# Patient Record
Sex: Male | Born: 1970 | Race: White | Hispanic: No | Smoking: Former smoker
Health system: Southern US, Community
[De-identification: ages and names within clinical notes are randomized; demographics above are authoritative.]

## PROBLEM LIST (undated history)

## (undated) DIAGNOSIS — F329 Major depressive disorder, single episode, unspecified: Secondary | ICD-10-CM

## (undated) DIAGNOSIS — F419 Anxiety disorder, unspecified: Secondary | ICD-10-CM

## (undated) DIAGNOSIS — F32A Depression, unspecified: Secondary | ICD-10-CM

## (undated) DIAGNOSIS — F1010 Alcohol abuse, uncomplicated: Secondary | ICD-10-CM

## (undated) DIAGNOSIS — I10 Essential (primary) hypertension: Secondary | ICD-10-CM

## (undated) DIAGNOSIS — I4891 Unspecified atrial fibrillation: Secondary | ICD-10-CM

---

## 2012-07-09 ENCOUNTER — Emergency Department (HOSPITAL_COMMUNITY)
Admission: EM | Admit: 2012-07-09 | Discharge: 2012-07-10 | Disposition: A | Discharge status: Other Acute Inpatient Hospital | Payer: 59 | Attending: Emergency Medicine | Admitting: Emergency Medicine

## 2012-07-09 ENCOUNTER — Encounter (HOSPITAL_COMMUNITY): Payer: Self-pay | Admitting: *Deleted

## 2012-07-09 DIAGNOSIS — F1010 Alcohol abuse, uncomplicated: Secondary | ICD-10-CM | POA: Insufficient documentation

## 2012-07-09 DIAGNOSIS — F17200 Nicotine dependence, unspecified, uncomplicated: Secondary | ICD-10-CM | POA: Insufficient documentation

## 2012-07-09 DIAGNOSIS — I10 Essential (primary) hypertension: Secondary | ICD-10-CM | POA: Insufficient documentation

## 2012-07-09 HISTORY — DX: Essential (primary) hypertension: I10

## 2012-07-09 LAB — POCT I-STAT, CHEM 8
BUN: 8 mg/dL (ref 6–23)
Chloride: 103 mEq/L (ref 96–112)
Creatinine, Ser: 1 mg/dL (ref 0.50–1.35)
Potassium: 3.4 mEq/L — ABNORMAL LOW (ref 3.5–5.1)
Sodium: 145 mEq/L (ref 135–145)

## 2012-07-09 LAB — ETHANOL: Alcohol, Ethyl (B): 224 mg/dL — ABNORMAL HIGH (ref 0–11)

## 2012-07-09 MED ORDER — ALUM & MAG HYDROXIDE-SIMETH 200-200-20 MG/5ML PO SUSP: 30.0000 mL | ORAL | Status: DC | PRN

## 2012-07-09 MED ORDER — FOLIC ACID 1 MG PO TABS
1.0000 mg | ORAL_TABLET | Freq: Every day | ORAL | Status: DC
  Administered 2012-07-09 – 2012-07-10 (×2): 1 mg via ORAL
  Filled 2012-07-09 (×2): qty 1

## 2012-07-09 MED ORDER — ZOLPIDEM TARTRATE 5 MG PO TABS
5.0000 mg | ORAL_TABLET | Freq: Every evening | ORAL | Status: DC | PRN
  Administered 2012-07-10: 5 mg via ORAL
  Filled 2012-07-09: qty 1

## 2012-07-09 MED ORDER — NICOTINE 21 MG/24HR TD PT24: 21.0000 mg | MEDICATED_PATCH | Freq: Every day | TRANSDERMAL | Status: DC | PRN

## 2012-07-09 MED ORDER — VITAMIN B-1 100 MG PO TABS
100.0000 mg | ORAL_TABLET | Freq: Every day | ORAL | Status: DC
  Administered 2012-07-09 – 2012-07-10 (×2): 100 mg via ORAL
  Filled 2012-07-09 (×2): qty 1

## 2012-07-09 MED ORDER — THIAMINE HCL 100 MG/ML IJ SOLN: 100.0000 mg | Freq: Every day | INTRAMUSCULAR | Status: DC

## 2012-07-09 MED ORDER — ONDANSETRON HCL 8 MG PO TABS: 4.0000 mg | ORAL_TABLET | Freq: Three times a day (TID) | ORAL | Status: DC | PRN

## 2012-07-09 MED ORDER — LORAZEPAM 1 MG PO TABS
1.0000 mg | ORAL_TABLET | Freq: Four times a day (QID) | ORAL | Status: DC | PRN
  Administered 2012-07-09 – 2012-07-10 (×3): 1 mg via ORAL
  Filled 2012-07-09 (×4): qty 1

## 2012-07-09 MED ORDER — ACETAMINOPHEN 325 MG PO TABS: 650.0000 mg | ORAL_TABLET | ORAL | Status: DC | PRN

## 2012-07-09 MED ORDER — LORAZEPAM 2 MG/ML IJ SOLN: 1.0000 mg | Freq: Four times a day (QID) | INTRAMUSCULAR | Status: DC | PRN

## 2012-07-09 MED ORDER — ADULT MULTIVITAMIN W/MINERALS CH
1.0000 | ORAL_TABLET | Freq: Every day | ORAL | Status: DC
  Administered 2012-07-09 – 2012-07-10 (×2): 1 via ORAL
  Filled 2012-07-09 (×2): qty 1

## 2012-07-09 MED ORDER — IBUPROFEN 200 MG PO TABS: 600.0000 mg | ORAL_TABLET | Freq: Three times a day (TID) | ORAL | Status: DC | PRN

## 2012-07-09 NOTE — ED Notes (Signed)
Pt is requesting detox from etoh, last drank this am. Denies any SI or HI. Pt tearful at triage.

## 2012-07-09 NOTE — ED Provider Notes (Signed)
History     CSN: [id number]  Arrival date & time 07/09/12  1814   None     Chief Complaint  Patient presents with  . Medical Clearance     HPI Pt is requesting detox from etoh, last drank this am. Denies any SI or HI. Pt tearful at triage. No HI/SI.   Past Medical History  Diagnosis Date  . Hypertension     History reviewed. No pertinent past surgical history.  History reviewed. No pertinent family history.  History  Substance Use Topics  . Smoking status: Current Everyday Smoker    Types: Cigarettes  . Smokeless tobacco: Not on file  . Alcohol Use: Yes      Review of Systems  Psychiatric/Behavioral: Positive for decreased concentration. Negative for hallucinations.  All other systems reviewed and are negative.    Allergies  Review of patient's allergies indicates no known allergies.  Home Medications   Current Outpatient Rx  Name Route Sig Dispense Refill  . AZOR PO Oral Take 1 tablet by mouth daily.      BP 158/110  Pulse 112  Temp 99.5 F (37.5 C) (Oral)  Resp 20  SpO2 94%  Physical Exam  Nursing note and vitals reviewed. Constitutional: He is oriented to person, place, and time. He appears well-developed and well-nourished. No distress.  HENT:  Head: Normocephalic and atraumatic.  Eyes: Pupils are equal, round, and reactive to light.  Neck: Normal range of motion.  Cardiovascular: Normal rate and intact distal pulses.   Pulmonary/Chest: No respiratory distress.  Abdominal: Normal appearance. He exhibits no distension.  Musculoskeletal: Normal range of motion.  Neurological: He is alert and oriented to person, place, and time. No cranial nerve deficit. He displays no seizure activity. GCS eye subscore is 4. GCS verbal subscore is 5. GCS motor subscore is 6.  Skin: Skin is warm and dry. No rash noted.  Psychiatric: He has a normal mood and affect. His behavior is normal.    ED Course  Procedures (including critical care time)  Labs  Reviewed  ETHANOL - Abnormal; Notable for the following:    Alcohol, Ethyl (B) 224 (*)     All other components within normal limits  POCT I-STAT, CHEM 8 - Abnormal; Notable for the following:    Potassium 3.4 (*)     Glucose, Bld 175 (*)     All other components within normal limits  URINE RAPID DRUG SCREEN (HOSP PERFORMED)   No results found.   No diagnosis found.    MDM          Nelia Shi, MD 07/09/12 [phone]

## 2012-07-10 ENCOUNTER — Inpatient Hospital Stay (HOSPITAL_COMMUNITY)
Admission: AD | Admit: 2012-07-10 | Discharge: 2012-07-14 | DRG: 897 | Disposition: A | Discharge status: Home / Self Care | Payer: 59 | Source: Ambulatory Visit | Attending: Psychiatry | Admitting: Psychiatry

## 2012-07-10 ENCOUNTER — Encounter (HOSPITAL_COMMUNITY): Payer: Self-pay | Admitting: *Deleted

## 2012-07-10 DIAGNOSIS — I10 Essential (primary) hypertension: Secondary | ICD-10-CM | POA: Diagnosis present

## 2012-07-10 DIAGNOSIS — F10229 Alcohol dependence with intoxication, unspecified: Principal | ICD-10-CM | POA: Diagnosis present

## 2012-07-10 DIAGNOSIS — F411 Generalized anxiety disorder: Secondary | ICD-10-CM | POA: Diagnosis present

## 2012-07-10 DIAGNOSIS — Z79899 Other long term (current) drug therapy: Secondary | ICD-10-CM

## 2012-07-10 DIAGNOSIS — R259 Unspecified abnormal involuntary movements: Secondary | ICD-10-CM | POA: Diagnosis present

## 2012-07-10 DIAGNOSIS — F1010 Alcohol abuse, uncomplicated: Secondary | ICD-10-CM | POA: Diagnosis present

## 2012-07-10 MED ORDER — ONDANSETRON 4 MG PO TBDP: 4.0000 mg | ORAL_TABLET | Freq: Four times a day (QID) | ORAL | Status: AC | PRN

## 2012-07-10 MED ORDER — MAGNESIUM HYDROXIDE 400 MG/5ML PO SUSP: 30.0000 mL | Freq: Every day | ORAL | Status: DC | PRN

## 2012-07-10 MED ORDER — HYDROXYZINE HCL 25 MG PO TABS: 25.0000 mg | ORAL_TABLET | Freq: Four times a day (QID) | ORAL | Status: DC | PRN

## 2012-07-10 MED ORDER — ACETAMINOPHEN 325 MG PO TABS: 650.0000 mg | ORAL_TABLET | Freq: Four times a day (QID) | ORAL | Status: DC | PRN

## 2012-07-10 MED ORDER — IRBESARTAN 150 MG PO TABS: 150.0000 mg | ORAL_TABLET | Freq: Every day | ORAL | Status: DC

## 2012-07-10 MED ORDER — ESCITALOPRAM OXALATE 20 MG PO TABS
20.0000 mg | ORAL_TABLET | Freq: Every day | ORAL | Status: DC
  Administered 2012-07-11 – 2012-07-14 (×4): 20 mg via ORAL
  Filled 2012-07-10 (×5): qty 1
  Filled 2012-07-10: qty 3
  Filled 2012-07-10: qty 1

## 2012-07-10 MED ORDER — IRBESARTAN 150 MG PO TABS
150.0000 mg | ORAL_TABLET | Freq: Every day | ORAL | Status: DC
  Administered 2012-07-10 – 2012-07-14 (×5): 150 mg via ORAL
  Filled 2012-07-10: qty 3
  Filled 2012-07-10 (×7): qty 1

## 2012-07-10 MED ORDER — LOPERAMIDE HCL 2 MG PO CAPS: 2.0000 mg | ORAL_CAPSULE | ORAL | Status: DC | PRN

## 2012-07-10 MED ORDER — LORAZEPAM 1 MG PO TABS
2.0000 mg | ORAL_TABLET | Freq: Once | ORAL | Status: AC
  Administered 2012-07-10: 2 mg via ORAL
  Filled 2012-07-10: qty 2

## 2012-07-10 MED ORDER — VITAMIN B-1 100 MG PO TABS
100.0000 mg | ORAL_TABLET | Freq: Every day | ORAL | Status: DC
  Administered 2012-07-11: 100 mg via ORAL
  Filled 2012-07-10 (×2): qty 1

## 2012-07-10 MED ORDER — LORAZEPAM 1 MG PO TABS
1.0000 mg | ORAL_TABLET | Freq: Once | ORAL | Status: AC
  Administered 2012-07-10: 1 mg via ORAL
  Filled 2012-07-10: qty 1

## 2012-07-10 MED ORDER — THIAMINE HCL 100 MG/ML IJ SOLN: 100.0000 mg | Freq: Once | INTRAMUSCULAR | Status: DC

## 2012-07-10 MED ORDER — ALUM & MAG HYDROXIDE-SIMETH 200-200-20 MG/5ML PO SUSP: 30.0000 mL | ORAL | Status: DC | PRN

## 2012-07-10 MED ORDER — AMLODIPINE BESYLATE 10 MG PO TABS
10.0000 mg | ORAL_TABLET | Freq: Once | ORAL | Status: AC
  Administered 2012-07-10: 10 mg via ORAL
  Filled 2012-07-10: qty 1

## 2012-07-10 MED ORDER — ADULT MULTIVITAMIN W/MINERALS CH
1.0000 | ORAL_TABLET | Freq: Every day | ORAL | Status: DC
  Administered 2012-07-11: 1 via ORAL
  Filled 2012-07-10 (×2): qty 1

## 2012-07-10 MED ORDER — CLONIDINE HCL 0.1 MG PO TABS
0.1000 mg | ORAL_TABLET | Freq: Four times a day (QID) | ORAL | Status: DC | PRN
  Administered 2012-07-11 – 2012-07-12 (×3): 0.1 mg via ORAL

## 2012-07-10 MED ORDER — CHLORDIAZEPOXIDE HCL 25 MG PO CAPS
50.0000 mg | ORAL_CAPSULE | Freq: Once | ORAL | Status: AC
  Administered 2012-07-10: 50 mg via ORAL
  Filled 2012-07-10: qty 2

## 2012-07-10 MED ORDER — CHLORDIAZEPOXIDE HCL 25 MG PO CAPS
25.0000 mg | ORAL_CAPSULE | Freq: Four times a day (QID) | ORAL | Status: DC | PRN
  Administered 2012-07-11: 25 mg via ORAL
  Filled 2012-07-10: qty 1

## 2012-07-10 MED ORDER — AMLODIPINE BESYLATE 5 MG PO TABS
5.0000 mg | ORAL_TABLET | Freq: Every day | ORAL | Status: DC
  Administered 2012-07-10 – 2012-07-14 (×5): 5 mg via ORAL
  Filled 2012-07-10 (×3): qty 1
  Filled 2012-07-10: qty 3
  Filled 2012-07-10 (×6): qty 1

## 2012-07-10 MED ORDER — AMLODIPINE BESYLATE 5 MG PO TABS: 5.0000 mg | ORAL_TABLET | Freq: Every day | ORAL | Status: DC

## 2012-07-10 NOTE — H&P (Signed)
Psychiatric Admission Assessment Adult  Patient Identification:  Nicholas Price Date of Evaluation:  07/10/2012 41 yo SWM CC: need alcohol detox  ETOH 224  History of Present Illness: Can't stop drinking by himself his tremor is increasing feels his liver is heavy and is afraid he is developing ascites. Started Azor 5/20 a month ago along with Lexapro 20 mg po daily by [facility]. Does have hand tremor no DT's or withdrawal seizures.     Past Psychiatric History: ETOH detox Nicholas Price [id number] stayed sober about 3 mos  Last DUI's about 5 years ago Longest period of sobriety was a bout a year going to meetings and doing what he should  Substance Abuse History: Began drinking age 6 and became alcoholic fairly quickly.Currently drinking 1/4 gallon of Vodka daily. Social History:    reports that he has been passively smoking Cigarettes.  He does not have any smokeless tobacco history on file. He reports that he drinks alcohol. He reports that he does not use illicit drugs. Did 4 years of college but didn't graduate. Never married no children. Employed at [employer] in Clinical biochemist.   Family Psych History: Brother has been treated for anxiety for about 12 years with Paxil.   Past Medical History:     Past Medical History  Diagnosis Date  . Hypertension   Just started Azor 5/20 about a month ago.    History reviewed. No pertinent past surgical history.  Allergies: No Known Allergies  Current Medications:  Prior to Admission medications   Medication Sig Start Date End Date Taking? Authorizing Provider  Amlodipine-Olmesartan (AZOR PO) Take 1 tablet by mouth daily.   Yes Historical Provider, MD    Mental Status Examination/Evaluation: Objective:  Appearance: Fairly Groomed  Psychomotor Activity:  Tremor Moderate hand L >R  Eye Contact:  Good  Speech:  Clear and Coherent and Normal Rate  Volume:  Normal  Mood:  Appropriate to situation   Affect:  Full Range  Thought Process:  clear rational goal oriented    Orientation:  Full  Thought Content:  No AVH/psychosis   Suicidal Thoughts:  No  Homicidal Thoughts:  No  Judgement:  Good  Insight:  Good    DIAGNOSIS:    AXIS I Alcohol Abuse Anxiety-recently started Lexapro   AXIS II Deferred  AXIS III See medical history.  AXIS IV occupational problems  AXIS V 41-50 serious symptoms     Treatment Plan Summary: Admit for medically supported alcohol detox using Librium. Assess for diabetes and restart BP med  Has used Antabuse in past

## 2012-07-10 NOTE — ED Notes (Signed)
Patients mother is Alisa Graff and her phone number is [phone].
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
## 2012-07-10 NOTE — ED Notes (Signed)
Act team at bedside 

## 2012-07-10 NOTE — Progress Notes (Signed)
Patient ID: Nicholas Price, male   DOB: [birth year], 41 y.o.   MRN: [id number] Pt is voluntary. Pt is here for detox. Pt seems vested and really wants to change his lifestyle. He states that his drinking causes him to forget to take his blood pressure medications. Pt states he drinks 1/2 gallon of liquor a week and a gallon on the weekends. Pt last drink was July 17,2013. Pt states that his drinking is starting to effect his health he is having lower abdomen pain and he feels his abdomen is swelling and holding on to fluids. Pt also states his stressors are having to lie to people about what he is doing and has a lot of guilt and feelings of worthlessness. Pt is pleasant and cooperative.

## 2012-07-10 NOTE — ED Provider Notes (Addendum)
Pt accepted for transfer by Dr. Catha Brow at [facility].   Charles B. Bernette Mayers, MD 07/10/12 1510  Pt's BP now elevated 165/100. Acceptance at [facility] has been delayed until this is normalized. Pt reportedly had been taking Azor on an inconsistent basis as an outpatient. Will give Norvasc in the ED (we don't have Azor as an option).   Charles B. Bernette Mayers, MD 07/10/12 [phone]

## 2012-07-10 NOTE — Tx Team (Signed)
Initial Interdisciplinary Treatment Plan  PATIENT STRENGTHS: (choose at least two) Ability for insight Active sense of humor Average or above average intelligence Capable of independent living Communication skills Motivation for treatment/growth Supportive family/friends  PATIENT STRESSORS: Substance abuse Anxiety, feelings of worthlessness and guilt   PROBLEM LIST: Problem List/Patient Goals Date to be addressed Date deferred Reason deferred Estimated date of resolution  Substance Abuse 07/10/12                                                      DISCHARGE CRITERIA:  Ability to meet basic life and health needs Withdrawal symptoms are absent or subacute and managed without 24-hour nursing intervention  PRELIMINARY DISCHARGE PLAN: Attend aftercare/continuing care group Attend 12-step recovery group Outpatient therapy Return to previous living arrangement  PATIENT/FAMIILY INVOLVEMENT: This treatment plan has been presented to and reviewed with the patient, Nicholas Price, and/or family member.  The patient and family have been given the opportunity to ask questions and make suggestions.  Nicholas Price 07/10/2012, 8:00 PM

## 2012-07-10 NOTE — BH Assessment (Signed)
Assessment Note  Update:  Received call from [facility] stating pt accepted to bed 300-2 by PA Watt to Dr. Catha Brow and that pt could be transported to [facility].  Completed support paperwork, updated assessment disposition, completed assessment notification and faxed to [facility] to log.  Updated EDP Bernette Mayers and ED staff.  ED staff to arrange transport via security to [facility], as pt is voluntary.     Disposition:  Disposition Disposition of Patient: Inpatient treatment program Type of inpatient treatment program: Adult (Pt accepted [facility])  On Site Evaluation by:   Reviewed with Physician:  Asencion Gowda, Rennis Harding 07/10/2012 3:06 PM

## 2012-07-11 DIAGNOSIS — F1010 Alcohol abuse, uncomplicated: Secondary | ICD-10-CM

## 2012-07-11 LAB — COMPREHENSIVE METABOLIC PANEL
Albumin: 3.6 g/dL (ref 3.5–5.2)
Alkaline Phosphatase: 86 U/L (ref 39–117)
BUN: 10 mg/dL (ref 6–23)
Calcium: 10.2 mg/dL (ref 8.4–10.5)
Potassium: 3.7 mEq/L (ref 3.5–5.1)
Sodium: 135 mEq/L (ref 135–145)
Total Protein: 7.5 g/dL (ref 6.0–8.3)

## 2012-07-11 LAB — LIPID PANEL
LDL Cholesterol: 111 mg/dL — ABNORMAL HIGH (ref 0–99)
Triglycerides: 93 mg/dL (ref <150)
VLDL: 19 mg/dL (ref 0–40)

## 2012-07-11 LAB — HEMOGLOBIN A1C: Hgb A1c MFr Bld: 5.6 % (ref <5.7)

## 2012-07-11 LAB — TSH: TSH: 2.513 u[IU]/mL (ref 0.350–4.500)

## 2012-07-11 MED ORDER — CHLORDIAZEPOXIDE HCL 25 MG PO CAPS
25.0000 mg | ORAL_CAPSULE | Freq: Every day | ORAL | Status: AC
  Administered 2012-07-14: 25 mg via ORAL
  Filled 2012-07-11: qty 1

## 2012-07-11 MED ORDER — ADULT MULTIVITAMIN W/MINERALS CH
1.0000 | ORAL_TABLET | Freq: Every day | ORAL | Status: DC
Start: 2012-07-11 — End: 2012-07-14
  Administered 2012-07-11 – 2012-07-14 (×4): 1 via ORAL
  Filled 2012-07-11 (×7): qty 1

## 2012-07-11 MED ORDER — CHLORDIAZEPOXIDE HCL 25 MG PO CAPS
25.0000 mg | ORAL_CAPSULE | Freq: Four times a day (QID) | ORAL | Status: AC | PRN
  Administered 2012-07-12: 25 mg via ORAL
  Filled 2012-07-11 (×2): qty 1

## 2012-07-11 MED ORDER — CHLORDIAZEPOXIDE HCL 25 MG PO CAPS
25.0000 mg | ORAL_CAPSULE | Freq: Four times a day (QID) | ORAL | Status: AC
  Administered 2012-07-11 (×3): 25 mg via ORAL
  Filled 2012-07-11 (×2): qty 1

## 2012-07-11 MED ORDER — CHLORDIAZEPOXIDE HCL 25 MG PO CAPS
25.0000 mg | ORAL_CAPSULE | ORAL | Status: AC
  Administered 2012-07-13 (×2): 25 mg via ORAL
  Filled 2012-07-11 (×2): qty 1

## 2012-07-11 MED ORDER — THIAMINE HCL 100 MG/ML IJ SOLN: 100.0000 mg | Freq: Once | INTRAMUSCULAR | Status: DC

## 2012-07-11 MED ORDER — CHLORDIAZEPOXIDE HCL 25 MG PO CAPS
25.0000 mg | ORAL_CAPSULE | Freq: Three times a day (TID) | ORAL | Status: AC
  Administered 2012-07-12 (×3): 25 mg via ORAL
  Filled 2012-07-11 (×3): qty 1

## 2012-07-11 MED ORDER — LOPERAMIDE HCL 2 MG PO CAPS: 2.0000 mg | ORAL_CAPSULE | ORAL | Status: AC | PRN

## 2012-07-11 MED ORDER — VITAMIN B-1 100 MG PO TABS
100.0000 mg | ORAL_TABLET | Freq: Every day | ORAL | Status: DC
  Administered 2012-07-12 – 2012-07-14 (×3): 100 mg via ORAL
  Filled 2012-07-11 (×6): qty 1

## 2012-07-11 MED ORDER — ONDANSETRON 4 MG PO TBDP: 4.0000 mg | ORAL_TABLET | Freq: Four times a day (QID) | ORAL | Status: DC | PRN

## 2012-07-11 MED ORDER — HYDROXYZINE HCL 25 MG PO TABS
25.0000 mg | ORAL_TABLET | Freq: Four times a day (QID) | ORAL | Status: AC | PRN
  Administered 2012-07-12: 25 mg via ORAL

## 2012-07-11 NOTE — Progress Notes (Signed)
[facility] MD Progress Note  07/11/2012 1:33 PM  Diagnosis:   Axis I: Alcohol Abuse Axis II: Deferred Axis III:  Past Medical History  Diagnosis Date  . Hypertension    Axis IV: other psychosocial or environmental problems and problems with primary support group Axis V: 31-40 impairment in reality testing  ADL's:  Intact  Sleep: Poor  Appetite:  Fair  Suicidal Ideation:  Pt denies any thoughts, plans, intent of suicide Homicidal Ideation:  Pt denies any thoughts, plans, intent of homicide  Mental Status Examination/Evaluation: Objective:  Appearance: Casual  Eye Contact::  Good  Speech:  Clear and Coherent  Volume:  Normal  Mood:  Anxious, Depressed, Hopeless, Irritable and Worthless  Affect:  Congruent  Thought Process:  Coherent  Orientation:  Full  Thought Content:  WDL  Suicidal Thoughts:  No  Homicidal Thoughts:  No  Memory:  Immediate;   Fair Recent;   Fair Remote;   Fair  Judgement:  Impaired  Insight:  Lacking  Psychomotor Activity:  Normal  Concentration:  Fair  Recall:  Fair  Akathisia:  No  Handed:  Left  AIMS (if indicated):     Assets:  Communication Skills Desire for Improvement  Sleep:  Number of Hours: 6.75    Vital Signs:Blood pressure 154/109, pulse 105, temperature 98.1 F (36.7 C), temperature source Oral, resp. rate 18, height 6' (1.829 m), weight 130.636 kg (288 lb). Current Medications: Current Facility-Administered Medications  Medication Dose Route Frequency Provider Last Rate Last Dose  . acetaminophen (TYLENOL) tablet 650 mg  650 mg Oral Q6H PRN Mickie D. Adams, PA      . alum & mag hydroxide-simeth (MAALOX/MYLANTA) 200-200-20 MG/5ML suspension 30 mL  30 mL Oral Q4H PRN Mickie D. Adams, PA      . amLODipine (NORVASC) tablet 5 mg  5 mg Oral Daily Alyson Kuroski-Mazzei, DO   5 mg at 07/11/12 0700   And  . irbesartan (AVAPRO) tablet 150 mg  150 mg Oral Daily Alyson Kuroski-Mazzei, DO   150 mg at 07/11/12 1610  . chlordiazePOXIDE (LIBRIUM)  capsule 25 mg  25 mg Oral Q6H PRN Verne Spurr, PA-C      . chlordiazePOXIDE (LIBRIUM) capsule 25 mg  25 mg Oral QID Verne Spurr, PA-C       Followed by  . chlordiazePOXIDE (LIBRIUM) capsule 25 mg  25 mg Oral TID Verne Spurr, PA-C       Followed by  . chlordiazePOXIDE (LIBRIUM) capsule 25 mg  25 mg Oral BH-qamhs Verne Spurr, PA-C       Followed by  . chlordiazePOXIDE (LIBRIUM) capsule 25 mg  25 mg Oral Daily Verne Spurr, PA-C      . chlordiazePOXIDE (LIBRIUM) capsule 50 mg  50 mg Oral Once Mickie D. Adams, PA   50 mg at 07/10/12 2156  . cloNIDine (CATAPRES) tablet 0.1 mg  0.1 mg Oral Q6H PRN Mickie D. Adams, PA      . escitalopram (LEXAPRO) tablet 20 mg  20 mg Oral Daily Mickie D. Adams, PA   20 mg at 07/11/12 9604  . hydrOXYzine (ATARAX/VISTARIL) tablet 25 mg  25 mg Oral Q6H PRN Verne Spurr, PA-C      . loperamide (IMODIUM) capsule 2-4 mg  2-4 mg Oral PRN Verne Spurr, PA-C      . magnesium hydroxide (MILK OF MAGNESIA) suspension 30 mL  30 mL Oral Daily PRN Mickie D. Adams, PA      . multivitamin with minerals tablet 1 tablet  1 tablet Oral Daily Verne Spurr, PA-C      . ondansetron (ZOFRAN-ODT) disintegrating tablet 4 mg  4 mg Oral Q6H PRN Mickie D. Adams, PA      . thiamine (B-1) injection 100 mg  100 mg Intramuscular Once [employer], PA-C      . thiamine (VITAMIN B-1) tablet 100 mg  100 mg Oral Daily Verne Spurr, PA-C      . DISCONTD: amLODipine (NORVASC) tablet 5 mg  5 mg Oral Daily Alyson Kuroski-Mazzei, DO      . DISCONTD: chlordiazePOXIDE (LIBRIUM) capsule 25 mg  25 mg Oral Q6H PRN Mickie D. Adams, PA   25 mg at 07/11/12 1219  . DISCONTD: hydrOXYzine (ATARAX/VISTARIL) tablet 25 mg  25 mg Oral Q6H PRN Mickie D. Adams, PA      . DISCONTD: irbesartan (AVAPRO) tablet 150 mg  150 mg Oral Daily Alyson Kuroski-Mazzei, DO      . DISCONTD: loperamide (IMODIUM) capsule 2-4 mg  2-4 mg Oral PRN Mickie D. Adams, PA      . DISCONTD: multivitamin with minerals tablet 1 tablet  1  tablet Oral Daily Mickie D. Adams, PA   1 tablet at 07/11/12 0814  . DISCONTD: ondansetron (ZOFRAN-ODT) disintegrating tablet 4 mg  4 mg Oral Q6H PRN Verne Spurr, PA-C      . DISCONTD: thiamine (B-1) injection 100 mg  100 mg Intramuscular Once Mickie D. Adams, PA      . DISCONTD: thiamine (VITAMIN B-1) tablet 100 mg  100 mg Oral Daily Mickie D. Adams, PA   100 mg at 07/11/12 4098   Facility-Administered Medications Ordered in Other Encounters  Medication Dose Route Frequency Provider Last Rate Last Dose  . amLODipine (NORVASC) tablet 10 mg  10 mg Oral Once Charles B. Bernette Mayers, MD   10 mg at 07/10/12 1623  . LORazepam (ATIVAN) tablet 1 mg  1 mg Oral Once [employer]. Pickering, MD   1 mg at 07/10/12 1629  . LORazepam (ATIVAN) tablet 2 mg  2 mg Oral Once Harrold Donath R. Pickering, MD   2 mg at 07/10/12 1738  . DISCONTD: acetaminophen (TYLENOL) tablet 650 mg  650 mg Oral Q4H PRN Nat Christen, MD      . DISCONTD: alum & mag hydroxide-simeth (MAALOX/MYLANTA) 200-200-20 MG/5ML suspension 30 mL  30 mL Oral PRN Nat Christen, MD      . DISCONTD: folic acid (FOLVITE) tablet 1 mg  1 mg Oral Daily Nelia Shi, MD   1 mg at 07/10/12 1006  . DISCONTD: ibuprofen (ADVIL,MOTRIN) tablet 600 mg  600 mg Oral Q8H PRN Nat Christen, MD      . DISCONTD: LORazepam (ATIVAN) injection 1 mg  1 mg Intravenous Q6H PRN Nelia Shi, MD      . DISCONTD: LORazepam (ATIVAN) tablet 1 mg  1 mg Oral Q6H PRN Nelia Shi, MD   1 mg at 07/10/12 0856  . DISCONTD: multivitamin with minerals tablet 1 tablet  1 tablet Oral Daily Nelia Shi, MD   1 tablet at 07/10/12 1006  . DISCONTD: nicotine (NICODERM CQ - dosed in mg/24 hours) patch 21 mg  21 mg Transdermal Daily PRN Nat Christen, MD      . DISCONTD: ondansetron (ZOFRAN) tablet 4 mg  4 mg Oral Q8H PRN Nat Christen, MD      . DISCONTD: thiamine (B-1) injection 100 mg  100 mg Intravenous Daily Nelia Shi, MD      .  DISCONTD: thiamine (VITAMIN B-1)  tablet 100 mg  100 mg Oral Daily Nelia Shi, MD   100 mg at 07/10/12 1006  . DISCONTD: zolpidem (AMBIEN) tablet 5 mg  5 mg Oral QHS PRN Nat Christen, MD   5 mg at 07/10/12 0132    Lab Results:  Results for orders placed during the hospital encounter of 07/10/12 (from the past 48 hour(s))  HEMOGLOBIN A1C     Status: Normal   Collection Time   07/11/12  6:15 AM      Component Value Range Comment   Hemoglobin A1C 5.6  <5.7 %    Mean Plasma Glucose 114  <117 mg/dL   LIPID PANEL     Status: Abnormal   Collection Time   07/11/12  6:15 AM      Component Value Range Comment   Cholesterol 184  0 - 200 mg/dL    Triglycerides 93  <409 mg/dL    HDL 54  >81 mg/dL    Total CHOL/HDL Ratio 3.4      VLDL 19  0 - 40 mg/dL    LDL Cholesterol 191 (*) 0 - 99 mg/dL   TSH     Status: Normal   Collection Time   07/11/12  6:15 AM      Component Value Range Comment   TSH 2.513  0.350 - 4.500 uIU/mL     Physical Findings: AIMS: Facial and Oral Movements Muscles of Facial Expression: None, normal Lips and Perioral Area: None, normal Jaw: None, normal Tongue: None, normal,Extremity Movements Upper (arms, wrists, hands, fingers): None, normal Lower (legs, knees, ankles, toes): None, normal, Trunk Movements Neck, shoulders, hips: None, normal, Overall Severity Severity of abnormal movements (highest score from questions above): None, normal Incapacitation due to abnormal movements: None, normal Patient's awareness of abnormal movements (rate only patient's report): No Awareness, Dental Status Current problems with teeth and/or dentures?: No Does patient usually wear dentures?: No  CIWA:  CIWA-Ar Total: 8  COWS:     Treatment Plan Summary: Daily contact with patient to assess and evaluate symptoms and progress in treatment Medication management No signs/symptoms of withdrawal from abusable substances.  Plan: Admit, start Librium protocol.  Discussed the risks, benefits, and probable  clinical course with and without treatment.  Pt is agreeable to the current course of treatment.   Janelie Goltz 07/11/2012, 1:33 PM

## 2012-07-11 NOTE — Progress Notes (Signed)
Psychoeducational Group Note  Date:  07/11/2012 Time:  1100  Group Topic/Focus:  Relapse Prevention Planning:   The focus of this group is to define relapse and discuss the need for planning to combat relapse.  Participation Level:  Active  Participation Quality:  Appropriate, Attentive, Sharing and Supportive  Affect:  Anxious and Appropriate  Cognitive:  Alert and Appropriate  Insight:  Good  Engagement in Group:  Good  Additional Comments:  Pt participated in group, discussing triggers that he feel will most affect him after his discharge. Pt also identified warning signs including isolation. Pt discussed coping skills with other pts in group.   Dalia Heading 07/11/2012, 1:41 PM

## 2012-07-11 NOTE — Progress Notes (Signed)
D:  Pt. blood pressure elevated 154/103, 154/109. A:  Given prn Clonidine for elevated blood pressure.  R:  Will continue to monitor.  Remains on q 15 minute checks for safety.

## 2012-07-11 NOTE — Progress Notes (Signed)
[facility] Group Notes:  (Counselor/Nursing/MHT/Case Management/Adjunct)  07/11/2012 1:40 PM  Type of Therapy:  Psychoeducational Skills  Participation Level:  Did Not Attend  Summary of Progress/Problems: Pt did not attend [employer].    Dalia Heading 07/11/2012, 1:40 PM

## 2012-07-11 NOTE — Discharge Planning (Signed)
New patient is here for alcohol detox.  Started drinking alcohol 20 years ago.  Has gone through detox in the past and has put together up to 6 mos of sobriety of going to AA mtgs and getting a sponsor.  Was living with mom in [place] for awhile.  Now has his own place-is working at [employer].  Plans to return home after detox and re-immerse himself in [place].

## 2012-07-11 NOTE — Progress Notes (Signed)
D: Pt remains hypertensive with most recent Bp of 144/99 HR 90 sitting. Pt positive for visible hand tremors. Pt denies any SI/HI. Pt is calm and cooperative. Pt attends groups and interacts appropriately within the milieu.  A: Writer administered prn clonidine for a blood pressure of 151/104 pulse of 90 at 2140. Pt BP dropped to 144/99 pulse of 90 at 1hr follow-up. Pt is asymptomatic. Continued support and availability as needed has been extended to this pt.  R: Pt safety remains with q69min checks.

## 2012-07-11 NOTE — Progress Notes (Signed)
[facility] Adult Inpatient Family/Significant Other Collateral Contact  Collateral Contact:  Education Completed;Michael Fitzhenry at [phone] has been identified by the patient as the family member/significant other who can provide for collateral information. With written consent from the patient, the family member/significant other has been contacted and reports:   That he has concern regarding patient's depression and anxiety which he has also suffered from for years.   Casimiro Needle was under impression caller was calling from 4 week inpatient treatment program vs short term    Clinical research associate provided phone number and   [facility] Unit telephone number   [name] 3:31 PM 07/11/2012
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
## 2012-07-11 NOTE — H&P (Signed)
Medical/psychiatric screening examination/treatment/procedure(s) were performed by non-physician practitioner and as supervising physician I was immediately available for consultation/collaboration.  I have seen and examined this patient and agree the major elements of this evaluation.  

## 2012-07-11 NOTE — Progress Notes (Signed)
D: Pt in bed resting with eyes closed. Respirations even and unlabored. Pt appears to be in no signs of distress at this time. A: Q15min checks remains for this pt. R: Pt remains safe at this time.   

## 2012-07-11 NOTE — Progress Notes (Signed)
D:Pt. about on the unit today.  He said he slept fair last pm.  Reports that he has been having some headaches, tremors and appears anxious at times.  He denies SI/HI.  He is attending groups.  He rates his depression as a 3/10.  He says that he plans to actively participate in AA upon discharge.  A:  Offered support and encouragement, Given meds as prescribed and prn Librium for withdrawal.  R:   Receptive to staff.  Remains on q 15 minute checks.  Will continue to follow.

## 2012-07-11 NOTE — Treatment Plan (Signed)
Interdisciplinary Treatment Plan Update (Adult)  Date: 07/11/2012  Time Reviewed: 1:33 PM   Progress in Treatment: Attending groups: Yes Participating in groups: Yes Taking medication as prescribed: Yes Tolerating medication: Yes   Family/Significant other contact made:  No Patient understands diagnosis:  Yes  As evidenced by asking for help with alcohol detox Discussing patient identified problems/goals with staff:  Yes  See below Medical problems stabilized or resolved:  Yes Denies suicidal/homicidal ideation: Yes  In AM group Issues/concerns per patient self-inventory:  Yes  Withdrawal symptoms Other:  New problem(s) identified: N/A  Reason for Continuation of Hospitalization: Medication stabilization Withdrawal symptoms  Interventions implemented related to continuation of hospitalization:  Librium taper  Encourage group attendance and partcipation  Additional comments:  Estimated length of stay: 3-4 days  Discharge Plan: Return home, see below  New goal(s): N/A  Review of initial/current patient goals per problem list:   1.  Goal(s): Safely detox from alcohol  Met:  No  Target date:7/21  As evidenced by: stable vitals, no withdrawal symptoms  2.  Goal (s): Identify comprehensive sobriety plan  Met:  Yes  Target date:7/19  As evidenced JX:BJYN states he will re-involve himself in the AA community and get a sponser  3.  Goal(s):  Met:  Yes  Target date:  As evidenced by:  4.  Goal(s):  Met:  Yes  Target date:  As evidenced by:  Attendees: Patient:     Family:     Physician:  Lupe Carney 07/11/2012 1:33 PM   Nursing:    07/11/2012 1:33 PM   Case Manager:  Richelle Ito, LCSW 07/11/2012 1:33 PM   Counselor:  07/11/2012 1:33 PM   Other:     Other:     Other:     Other:      Scribe for Treatment Team:   Ida Rogue, 07/11/2012 1:33 PM
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
## 2012-07-12 DIAGNOSIS — I10 Essential (primary) hypertension: Secondary | ICD-10-CM

## 2012-07-12 NOTE — Progress Notes (Signed)
D.  Pt pleasant on approach, positive for group with appropriate participation.  Pt having some tremors, anxiety from withdrawal, no acute distress.  Pt's blood pressure was up and he received Clonidine for this.  Pt denies SI/HI/hallucinations at this time.  Pt is interacting appropriately within milieu.  A.  Support and encouragement offered, will continue to monitor. R. Pt remains safe on unit.

## 2012-07-12 NOTE — Progress Notes (Signed)
[facility] Group Notes:  (Counselor/Nursing/MHT/Case Management/Adjunct)  07/12/2012 9:22 PM  Type of Therapy:  Psychoeducational Skills  Participation Level:  Active  Participation Quality:  Appropriate  Affect:  Appropriate  Cognitive:  Appropriate  Insight:  Good  Engagement in Group:  Good  Engagement in Therapy:  Good  Modes of Intervention:  Support  Summary of Progress/Problems:   Christ Kick 07/12/2012, 9:22 PM

## 2012-07-12 NOTE — Progress Notes (Signed)
D) Pt attending the groups partisipates and interacting with select peers. Denies SI and HI and reports that his depression and hopelessness is at a 0.  Pt is engaged in his program and is motivated to work it. A) Given support, reassurance and praise. Encouragement given. R) Attending the groups, interacting appropriately and invested in the program and his soberity

## 2012-07-12 NOTE — Progress Notes (Signed)
Patient ID: Nicholas Price, male   DOB: 09/02/1971, 41 y.o.   MRN: [id number]  [facility] Group Notes:  (Counselor/Nursing/MHT/Case Management/Adjunct)  07/12/2012 1:15 PM  Type of Therapy:  Group Therapy, Dance/Movement Therapy   Participation Level:  Active  Participation Quality:  Appropriate  Affect:  Appropriate  Cognitive:  Appropriate  Insight:  Limited  Engagement in Group:  Limited  Engagement in Therapy:  Limited  Modes of Intervention:  Clarification, Problem-solving, Role-play, Socialization and Support  Summary of Progress/Problems:  Therapist asked group to define the term self sabotage.  Therapist asked group to identify self sabotaging behaviors in their lives.  Therapist asked group why is it important to recognize triggers.  Once triggers are identified; therapist asked group what are ways to develop healthy coping mechanisms.  Group discussed various motivations in their lives that help maintain their recovery process.     Rhunette Croft 07/12/2012. 3:32 PM

## 2012-07-12 NOTE — Progress Notes (Signed)
  Nicholas Price is a 41 y.o. male [id number] 25-Mar-1971  07/10/2012 Principal Problem:  *Alcohol abuse, continuous Active Problems:  Hypertension   Mental Status: Alert mood is bright cheerful. Denies SI/HI/AVH.    Subjective/Objective: Has less hand tremor. Slept well. Hoping to get into a long term SA program.Called his employer who advised that he should do what he needs to do to get well.    Filed Vitals:   07/12/12 0703  BP: 138/91  Pulse: 85  Temp:   Resp: 18    Lab Results:   BMET    Component Value Date/Time   NA 135 07/11/2012 1942   K 3.7 07/11/2012 1942   CL 94* 07/11/2012 1942   CO2 31 07/11/2012 1942   GLUCOSE 95 07/11/2012 1942   BUN 10 07/11/2012 1942   CREATININE 0.80 07/11/2012 1942   CALCIUM 10.2 07/11/2012 1942   GFRNONAA >90 07/11/2012 1942   GFRAA >90 07/11/2012 1942    Medications:  Scheduled:     . amLODipine  5 mg Oral Daily   And  . irbesartan  150 mg Oral Daily  . chlordiazePOXIDE  25 mg Oral QID   Followed by  . chlordiazePOXIDE  25 mg Oral TID   Followed by  . chlordiazePOXIDE  25 mg Oral BH-qamhs   Followed by  . chlordiazePOXIDE  25 mg Oral Daily  . escitalopram  20 mg Oral Daily  . multivitamin with minerals  1 tablet Oral Daily  . thiamine  100 mg Intramuscular Once  . thiamine  100 mg Oral Daily  . DISCONTD: multivitamin with minerals  1 tablet Oral Daily  . DISCONTD: thiamine  100 mg Intramuscular Once  . DISCONTD: thiamine  100 mg Oral Daily     PRN Meds acetaminophen, alum & mag hydroxide-simeth, chlordiazePOXIDE, cloNIDine, hydrOXYzine, loperamide, magnesium hydroxide, ondansetron, DISCONTD: chlordiazePOXIDE, DISCONTD: hydrOXYzine, DISCONTD: loperamide, DISCONTD: ondansetron  Plan: Continue current plan of care.            Program availability will be known Monday.  Kenyetta Fife,MICKIE D. 07/12/2012

## 2012-07-12 NOTE — Progress Notes (Signed)
Patient ID: Nicholas Price, male   DOB: [birth year], 41 y.o.   MRN: [id number] Pt. attended and participated in aftercare planning group. Pt. verbally accepted information on suicide prevention, warning signs to look for with suicide and crisis line numbers to use. The pt. agreed to call crisis line numbers if having warning signs or having thoughts of suicide. Pt. listed their current anxiety and depression levels as 2 on a scale of 1 to 10 with 10 being the high.   Pt. Indicated that he has an interest in locating a 30 day program.

## 2012-07-13 NOTE — Progress Notes (Signed)
Patient ID: Nicholas Price, male   DOB: 02-29-1971, 41 y.o.   MRN: [id number]  After care: Pt. attended and participated in aftercare planning group. Pt. verbally accepted information on suicide prevention, warning signs to look for with suicide and crisis line numbers to use. The pt. agreed to call crisis line numbers if having warning signs or having thoughts of suicide. Pt. listed their current anxiety level as 3 and their current depression level as 2. Pt. shared that when he spoke with the doctor a long-term treatment program was recommended and he agreed.

## 2012-07-13 NOTE — Progress Notes (Signed)
Psychoeducational Group Note  Date:  07/13/2012 Time:  1515  Group Topic/Focus:  Conflict Resolution:   The focus of this group is to discuss the conflict resolution process and how it may be used upon discharge.  Participation Level:  Active  Participation Quality:  Appropriate  Affect:  Appropriate  Cognitive:  Appropriate  Insight:  Good  Engagement in Group:  Good  Additional Comments:  none  Takeshia Wenk, Genia Plants 07/13/2012, 4:05 PM

## 2012-07-13 NOTE — Progress Notes (Signed)
Patient ID: Nicholas Price, male   DOB: 04/21/1971, 41 y.o.   MRN: [id number] Pt. denies lethality and A/V/H's or other problems, except for some mild tremors and anxiety associated with withdrawals.  Pt. Is pleasant and cooperative with staff and peers and is attending groups.

## 2012-07-13 NOTE — Progress Notes (Signed)
[facility] Group Notes:  (Counselor/Nursing/MHT/Case Management/Adjunct)  07/13/2012 2100  Type of Therapy:  wrap up group  Participation Level:  Active  Participation Quality:  Appropriate, Attentive and Sharing  Affect:  Appropriate  Cognitive:  Alert  Insight:  Good  Engagement in Group:  Good  Engagement in Therapy:  Good  Modes of Intervention:  Clarification, Education and Support  Summary of Progress/Problems: Pt stated that he was very grateful for his family who he is going to rely heavily on for support when he gets out of treatment.  Pt wants to go to a 30 day  program following discharge from [facility].  Pt stated, "tommorrow I would like to speak more during groups, come out of my shell."    Shelah Lewandowsky 07/13/2012, 12:09 AM

## 2012-07-13 NOTE — Progress Notes (Signed)
Patient ID: Nicholas Price, male   DOB: 03-25-1971, 41 y.o.   MRN: [id number]   [facility] Group Notes:  (Counselor/Nursing/MHT/Case Management/Adjunct)  07/13/2012 1:15 PM  Type of Therapy:  Group Therapy, Dance/Movement Therapy   Participation Level:  Active  Participation Quality:  Appropriate  Affect:  Appropriate  Cognitive:  Appropriate  Insight:  Good  Engagement in Group:  Good  Engagement in Therapy:  Good  Modes of Intervention:  Clarification, Problem-solving, Role-play, Socialization and Support  Summary of Progress/Problems: Therapist and group members discussed motivation for recovery, healthy support systems, and how to ask for help when we need it. Therapist encouraged group members to share positive statements about one another and group members practiced repeating these positive affirmations. Therapist ended group by sharing the 12 promises and encouraged group members to make their own promises and positive statements. Pt. shared that his motivation for recovery was his health because he did not want to die.     Cassidi Long 07/13/2012. 2:19 PM

## 2012-07-13 NOTE — Progress Notes (Signed)
  Nicholas Price is a 41 y.o. male [id number] [birth year]  07/10/2012 Principal Problem:  *Alcohol abuse, continuous Active Problems:  Hypertension   Mental Status: Mood is good. Denies SI/HI/AVH.    Subjective/Objective: Has a fine tremor and some increase in BP. Feels well and is ready to do "whatever it takes to make it stick " this time.  Wants to know about a 30-60-90 day program DR. Walker mentioned. This Clinical research associate is unfamiliar with this program.    Filed Vitals:   07/13/12 0829  BP: 138/93  Pulse:   Temp:   Resp:     Lab Results:   BMET    Component Value Date/Time   NA 135 07/11/2012 1942   K 3.7 07/11/2012 1942   CL 94* 07/11/2012 1942   CO2 31 07/11/2012 1942   GLUCOSE 95 07/11/2012 1942   BUN 10 07/11/2012 1942   CREATININE 0.80 07/11/2012 1942   CALCIUM 10.2 07/11/2012 1942   GFRNONAA >90 07/11/2012 1942   GFRAA >90 07/11/2012 1942    Medications:  Scheduled:     . amLODipine  5 mg Oral Daily   And  . irbesartan  150 mg Oral Daily  . chlordiazePOXIDE  25 mg Oral TID   Followed by  . chlordiazePOXIDE  25 mg Oral BH-qamhs   Followed by  . chlordiazePOXIDE  25 mg Oral Daily  . escitalopram  20 mg Oral Daily  . multivitamin with minerals  1 tablet Oral Daily  . thiamine  100 mg Intramuscular Once  . thiamine  100 mg Oral Daily     PRN Meds acetaminophen, alum & mag hydroxide-simeth, chlordiazePOXIDE, cloNIDine, hydrOXYzine, loperamide, magnesium hydroxide, ondansetron  Plan: continue current plan of care            Discharge plan can  be determined tomorrow.  Rhiannan Kievit,MICKIE D. 07/13/2012

## 2012-07-14 DIAGNOSIS — F1010 Alcohol abuse, uncomplicated: Secondary | ICD-10-CM

## 2012-07-14 MED ORDER — IRBESARTAN 150 MG PO TABS: 150.0000 mg | ORAL_TABLET | Freq: Every day | ORAL | Status: DC

## 2012-07-14 MED ORDER — AMLODIPINE BESYLATE 5 MG PO TABS: 5.0000 mg | ORAL_TABLET | Freq: Every day | ORAL | Status: DC

## 2012-07-14 MED ORDER — ESCITALOPRAM OXALATE 20 MG PO TABS: 20.0000 mg | ORAL_TABLET | Freq: Every day | ORAL | Status: DC

## 2012-07-14 NOTE — Progress Notes (Signed)
[facility] Group Notes:  (Counselor/Nursing/MHT/Case Management/Adjunct)  07/14/2012 6:04 PM  Type of Therapy:  Group Therapy at 1:15 to 2:30   Participation Level:  Active  Participation Quality:  Appropriate  Affect:  Appropriate  Cognitive:  Alert and Appropriate  Insight:  Limited  Engagement in Group:  Limited  Engagement in Therapy:  Good  Modes of Intervention:  Clarification, Problem-solving and Support  Summary of Progress/Problems:  Group discussion focused on what patient's see as their own obstacles to recovery.  Patient shares belief that sobriety will be difficult to maintain because he never has been able to do it for any substantial period of time before relapsing.  Patient shared ability to make others think he is okay to point "I'll even believe it which is big trouble."  Patient was asked to consider that the only thing harder to quit may be to continue on using in pattern he is currently using.    [name] 07/14/2012 5:08 PM

## 2012-07-14 NOTE — Progress Notes (Signed)
[facility] Case Management Discharge Plan:  Will you be returning to the same living situation after discharge: No. At discharge, do you have transportation home?:Yes,  family Do you have the ability to pay for your medications:Yes,  insurance  Interagency Information:     Release of information consent forms completed and in the chart;  Patient's signature needed at discharge.  Patient to Follow up at:  Follow-up Information    Follow up with [facility] (They will be in touch with you about admission, or call them if you do not hear by Tues afternoon)    Contact information:   [address]  [336]  [phone]         Patient denies SI/HI:   Yes,  yes    Safety Planning and Suicide Prevention discussed:  Yes,  yes  Barrier to discharge identified:No.  Summary and Recommendations:   Nicholas Price 07/14/2012, 12:13 PM

## 2012-07-14 NOTE — Progress Notes (Signed)
Pt pleasant and bright on approach.  Very pleased with his blood pressure since taking prn Clonidine 0.1 mg at HS.  Pt wishes to continue this, or discuss other options with doctor for blood pressure management tomorrow.  Denies SI/HI/hallucinations, reports good day.  Support and encouragement offered, prns given as requested for BP and sleep.  Pt remains safe, in dayroom on phone at time of assessment.  Will continue to monitor.

## 2012-07-14 NOTE — Progress Notes (Signed)
Patient ID: Nicholas Price, male   DOB: [birth year], 41 y.o.   MRN: [id number] Pt discharged home. He was picked up by his mother. Pt. Voiced understanding of discharge instruction and of follow-up. He denies thoughts of SI. All belonging taken home with him.
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
## 2012-07-14 NOTE — Progress Notes (Signed)
Patient ID: Nicholas Price, male   DOB: [birth year], 41 y.o.   MRN: [id number] He has been up and about and to groups, interacting with peers and staff. Denies thoughts of SI. Stated that he was  Going to stay with his mother till he got into [facility]. Stated that he had to get better because he was tired of drinking. He has bee cooperative , alert and calm.

## 2012-07-14 NOTE — Discharge Planning (Signed)
Today Nicholas Price's mood is good and he states he is having minimal withdrawal symptoms.  Is interested in [facility] and signed a release.  Will send info today.  Says he has a safe place to stay if no bed immediately.

## 2012-07-14 NOTE — Treatment Plan (Signed)
Interdisciplinary Treatment Plan Update (Adult)  Date: 07/14/2012  Time Reviewed: 10:50 AM   Progress in Treatment: Attending groups: Yes Participating in groups: Yes Taking medication as prescribed: Yes Tolerating medication: Yes   Family/Significant othe contact made:   Patient understands diagnosis:  Yes Discussing patient identified problems/goals with staff:  Yes Medical problems stabilized or resolved:  Yes Denies suicidal/homicidal ideation: Yes  In tx team Issues/concerns per patient self-inventory:  None noted Other:  New problem(s) identified: N/A  Reason for Continuation of Hospitalization: Other; describe D/C today  Interventions implemented related to continuation of hospitalization:   Additional comments:  Estimated length of stay:  Discharge Plan: If unable to get into [facility] today, will stay with family until they have a bed.  New goal(s): N/A  Review of initial/current patient goals per problem list:   1.  Goal(s): Safely detox from alcohol  Met:  Yes  Target date:7/22  As evidenced GE:XBMWUX vitals, no withdrawal symptoms  2.  Goal (s): Identify comprehensive plan  Met:  Yes  Target date:7/22  As evidenced by:Today [name] is requesting referral to [facility]  3.  Goal(s):   Met:  Yes  Target date:  As evidenced by:  4.  Goal(s):  Met:  Yes  Target date:  As evidenced by:  Attendees: Patient: Nicholas Price  07/14/2012 10:50 AM  Family:     Physician:  Lupe Carney 07/14/2012 10:50 AM   Nursing: Robbie Louis   07/14/2012 10:50 AM   Case Manager:  Richelle Ito, LCSW 07/14/2012 10:50 AM   Counselor:  Ronda Fairly, LCSWA 07/14/2012 10:50 AM   Other:     Other:     Other:     Other:      Scribe for Treatment Team:   Ida Rogue, 07/14/2012 10:50 AM

## 2012-07-14 NOTE — Discharge Summary (Signed)
Physician Discharge Summary Note  Patient:  Nicholas Price is an 41 y.o., male MRN:  [id number] DOB:  1971-03-09 Patient phone:  [phone] (home)  Patient address:   [address] [place] [address]   Date of Admission:  07/10/2012 Date of Discharge: 07/14/2012   Discharge Diagnoses: Principal Problem:  *Alcohol abuse, continuous Active Problems:  Hypertension  Axis Diagnosis:  Alcohol abuse  Level of Care:  [facility]  Hospital Course:   This is a voluntary admission for Nicholas Price who is a 41 year old male who presented to the [facility] emergency department requesting assistance for detox. He was given medical clearance and transferred to behavioral health hospital. The patient was started on a Librium detox protocol and his health problems were treated appropriately. Horrace was evaluated daily by clinical providers. His response to detox was monitored daily CIWA scores. The patient was asked to complete a daily self inventory to address his mental and emotional status. He was encouraged to participate in unit programming including AA meetings. He was also evaluated by the clinical treatment team and encouraged to consider residential treatment upon completion of his detox. On the day of discharge the patient was in full contact with reality he denied suicidal and homicidal ideation reported no symptoms of withdrawal and had no evidence of psychosis. He was in much improved condition than upon admission and was medically stable for discharge. The patient reported that he was ready to go, he was enthusiastic and optimistic about pursuing his sobriety. Case manager reports [facility].   Follow-up Information    Follow up with [facility] (They will be in touch with you about admission, or call them if you do not hear by Tues afternoon)    Contact information:   [address]  [place]  [336]  [phone]       Consults:  None  Significant Diagnostic Studies:  None  Discharge Vitals:   Blood pressure 135/89, pulse 74, temperature 97.5 F (36.4 C), temperature source Oral, resp. rate 20, height 6' (1.829 m), weight 130.636 kg (288 lb)..  Mental Status Exam: See Mental Status Examination and Suicide Risk Assessment completed by Attending Physician prior to discharge.  Discharge destination:  Home  Is patient on multiple antipsychotic therapies at discharge:  No  Has Patient had three or more failed trials of antipsychotic monotherapy by history: N/A Recommended Plan for Multiple Antipsychotic Therapies: N/A Discharge Orders    Future Orders Please Complete By Expires   Diet - low sodium heart healthy      Increase activity slowly      Discharge instructions      Comments:   Take all of your medications as prescribed.  Be sure to keep ALL follow up appointments as scheduled. This is to ensure getting your refills on time to avoid any interruption in your medication.  If you find that you can not keep your appointment, call the clinic and reschedule. Be sure to tell the nurse if you will need a refill before your appointment.     Medication List  As of 07/14/2012  1:34 PM   STOP taking these medications         AZOR PO         TAKE these medications      Indication    amLODipine 5 MG tablet   Commonly known as: NORVASC   Take 1 tablet (5  mg total) by mouth daily. For hypertension.    Indication: High Blood Pressure      escitalopram 20 MG tablet   Commonly known as: LEXAPRO   Take 1 tablet (20 mg total) by mouth daily. For depression.    Indication: Depression      irbesartan 150 MG tablet   Commonly known as: AVAPRO   Take 1 tablet (150 mg total) by mouth daily. For hypertension.    Indication: High Blood Pressure           Follow-up Information    Follow up with [facility] (They will be in touch with you about admission, or call them if you do not hear  by Tues afternoon)    Contact information:   [address]    [336]  [phone]        Follow-up recommendations:   Activities: Resume typical activities Diet: Resume typical diet Other: Follow up with outpatient provider and report any side effects to out patient prescriber.  Comments:  Take all your medications as prescribed by your mental healthcare provider. Report any adverse effects and or reactions from your medicines to your outpatient provider promptly. Patient is instructed and cautioned to not engage in alcohol and or illegal drug use while on prescription medicines. In the event of worsening symptoms, patient is instructed to call the crisis hotline, 911 and or go to the nearest ED for appropriate evaluation and treatment of symptoms.  Signed: Rona Ravens. [place] PAC For Dr. Knute Neu 07/14/2012 1:34 PM

## 2012-07-16 NOTE — Progress Notes (Signed)
Patient Discharge Instructions:  After Visit Summary (AVS):   Faxed to:  07/15/2012 Psychiatric Admission Assessment Note:   Faxed to:  07/15/2012 Suicide Risk Assessment - Discharge Assessment:   Faxed to:  07/15/2012 Faxed/Sent to the Next Level Care provider:  07/15/2012  Faxed to [facility] @ [phone]  Heloise Purpura, Eduard Clos, 07/16/2012, 6:02 PM

## 2014-02-05 ENCOUNTER — Other Ambulatory Visit: Payer: Self-pay | Admitting: Family Medicine

## 2014-02-05 ENCOUNTER — Ambulatory Visit
Admission: RE | Admit: 2014-02-05 | Discharge: 2014-02-05 | Disposition: A | Discharge status: Home / Self Care | Payer: 59 | Source: Ambulatory Visit | Attending: Family Medicine | Admitting: Family Medicine

## 2014-02-05 DIAGNOSIS — R52 Pain, unspecified: Secondary | ICD-10-CM

## 2015-05-19 ENCOUNTER — Emergency Department (HOSPITAL_COMMUNITY): Payer: 59

## 2015-05-19 ENCOUNTER — Encounter (HOSPITAL_COMMUNITY): Payer: Self-pay | Admitting: Emergency Medicine

## 2015-05-19 ENCOUNTER — Observation Stay (HOSPITAL_COMMUNITY)
Admission: EM | Admit: 2015-05-19 | Discharge: 2015-05-21 | Disposition: A | Discharge status: Home / Self Care | Payer: 59 | Attending: Internal Medicine | Admitting: Internal Medicine

## 2015-05-19 ENCOUNTER — Other Ambulatory Visit (HOSPITAL_COMMUNITY): Payer: Self-pay

## 2015-05-19 DIAGNOSIS — F329 Major depressive disorder, single episode, unspecified: Secondary | ICD-10-CM | POA: Diagnosis present

## 2015-05-19 DIAGNOSIS — I10 Essential (primary) hypertension: Secondary | ICD-10-CM | POA: Diagnosis present

## 2015-05-19 DIAGNOSIS — R072 Precordial pain: Secondary | ICD-10-CM | POA: Insufficient documentation

## 2015-05-19 DIAGNOSIS — R079 Chest pain, unspecified: Secondary | ICD-10-CM | POA: Diagnosis present

## 2015-05-19 DIAGNOSIS — R000 Tachycardia, unspecified: Secondary | ICD-10-CM | POA: Insufficient documentation

## 2015-05-19 DIAGNOSIS — R61 Generalized hyperhidrosis: Secondary | ICD-10-CM | POA: Diagnosis not present

## 2015-05-19 DIAGNOSIS — R197 Diarrhea, unspecified: Secondary | ICD-10-CM | POA: Diagnosis present

## 2015-05-19 DIAGNOSIS — F419 Anxiety disorder, unspecified: Secondary | ICD-10-CM | POA: Diagnosis present

## 2015-05-19 DIAGNOSIS — F32A Depression, unspecified: Secondary | ICD-10-CM | POA: Diagnosis present

## 2015-05-19 DIAGNOSIS — R110 Nausea: Secondary | ICD-10-CM | POA: Insufficient documentation

## 2015-05-19 DIAGNOSIS — R0602 Shortness of breath: Secondary | ICD-10-CM | POA: Diagnosis not present

## 2015-05-19 DIAGNOSIS — F1010 Alcohol abuse, uncomplicated: Secondary | ICD-10-CM | POA: Diagnosis present

## 2015-05-19 HISTORY — DX: Major depressive disorder, single episode, unspecified: F32.9

## 2015-05-19 HISTORY — DX: Depression, unspecified: F32.A

## 2015-05-19 HISTORY — DX: Alcohol abuse, uncomplicated: F10.10

## 2015-05-19 LAB — BASIC METABOLIC PANEL
Anion gap: 14 (ref 5–15)
BUN: 11 mg/dL (ref 6–20)
CALCIUM: 8.8 mg/dL — AB (ref 8.9–10.3)
CO2: 20 mmol/L — ABNORMAL LOW (ref 22–32)
CREATININE: 1.02 mg/dL (ref 0.61–1.24)
Chloride: 102 mmol/L (ref 101–111)
GFR calc Af Amer: >60 mL/min (ref >60)
Glucose, Bld: 127 mg/dL — ABNORMAL HIGH (ref 65–99)
POTASSIUM: 3.8 mmol/L (ref 3.5–5.1)
Sodium: 136 mmol/L (ref 135–145)

## 2015-05-19 LAB — CBC
HCT: 48.8 % (ref 39.0–52.0)
Hemoglobin: 17.2 g/dL — ABNORMAL HIGH (ref 13.0–17.0)
MCH: 32.3 pg (ref 26.0–34.0)
MCHC: 35.2 g/dL (ref 30.0–36.0)
MCV: 91.6 fL (ref 78.0–100.0)
PLATELETS: 154 10*3/uL (ref 150–400)
RBC: 5.33 MIL/uL (ref 4.22–5.81)
RDW: 12.6 % (ref 11.5–15.5)
WBC: 9.9 10*3/uL (ref 4.0–10.5)

## 2015-05-19 LAB — I-STAT TROPONIN, ED
TROPONIN I, POC: 0 ng/mL (ref 0.00–0.08)
Troponin i, poc: 0 ng/mL (ref 0.00–0.08)

## 2015-05-19 MED ORDER — HYDROMORPHONE HCL 1 MG/ML IJ SOLN
1.0000 mg | Freq: Once | INTRAMUSCULAR | Status: AC
  Administered 2015-05-19: 1 mg via INTRAVENOUS
  Filled 2015-05-19: qty 1

## 2015-05-19 MED ORDER — ONDANSETRON HCL 4 MG/2ML IJ SOLN
4.0000 mg | Freq: Once | INTRAMUSCULAR | Status: AC
  Administered 2015-05-19: 4 mg via INTRAVENOUS
  Filled 2015-05-19: qty 2

## 2015-05-19 MED ORDER — IOHEXOL 350 MG/ML SOLN
100.0000 mL | Freq: Once | INTRAVENOUS | Status: AC | PRN
  Administered 2015-05-19: 80 mL via INTRAVENOUS

## 2015-05-19 MED ORDER — SODIUM CHLORIDE 0.9 % IV SOLN
INTRAVENOUS | Status: DC
  Administered 2015-05-19 – 2015-05-21 (×4): via INTRAVENOUS

## 2015-05-19 MED ORDER — SODIUM CHLORIDE 0.9 % IV BOLUS (SEPSIS)
500.0000 mL | Freq: Once | INTRAVENOUS | Status: AC
  Administered 2015-05-19: 500 mL via INTRAVENOUS

## 2015-05-19 MED ORDER — ASPIRIN 81 MG PO CHEW
324.0000 mg | CHEWABLE_TABLET | Freq: Once | ORAL | Status: AC
  Administered 2015-05-19: 324 mg via ORAL
  Filled 2015-05-19: qty 4

## 2015-05-19 NOTE — ED Notes (Addendum)
Patient with chest pain that started around 2pm today.  Patient does have shortness of breath and nausea associated with the chest pain.  Patient states that it hurts worse with palpation of his chest and when he takes a deep breath.  Patient states that he has pain in his left arm also.  Patient also with redness on upper arms and chest.  No hives noted but patient is diaphoretic.

## 2015-05-20 ENCOUNTER — Encounter (HOSPITAL_COMMUNITY): Payer: Self-pay | Admitting: Internal Medicine

## 2015-05-20 ENCOUNTER — Inpatient Hospital Stay (HOSPITAL_COMMUNITY): Payer: 59

## 2015-05-20 ENCOUNTER — Inpatient Hospital Stay (HOSPITAL_BASED_OUTPATIENT_CLINIC_OR_DEPARTMENT_OTHER): Payer: 59

## 2015-05-20 DIAGNOSIS — R079 Chest pain, unspecified: Secondary | ICD-10-CM

## 2015-05-20 DIAGNOSIS — F329 Major depressive disorder, single episode, unspecified: Secondary | ICD-10-CM | POA: Diagnosis not present

## 2015-05-20 DIAGNOSIS — R0602 Shortness of breath: Secondary | ICD-10-CM | POA: Diagnosis not present

## 2015-05-20 DIAGNOSIS — I2699 Other pulmonary embolism without acute cor pulmonale: Secondary | ICD-10-CM | POA: Diagnosis not present

## 2015-05-20 DIAGNOSIS — R61 Generalized hyperhidrosis: Secondary | ICD-10-CM | POA: Diagnosis not present

## 2015-05-20 DIAGNOSIS — F419 Anxiety disorder, unspecified: Secondary | ICD-10-CM | POA: Diagnosis present

## 2015-05-20 DIAGNOSIS — R197 Diarrhea, unspecified: Secondary | ICD-10-CM

## 2015-05-20 DIAGNOSIS — F1010 Alcohol abuse, uncomplicated: Secondary | ICD-10-CM | POA: Diagnosis not present

## 2015-05-20 DIAGNOSIS — R000 Tachycardia, unspecified: Secondary | ICD-10-CM | POA: Diagnosis not present

## 2015-05-20 DIAGNOSIS — R072 Precordial pain: Secondary | ICD-10-CM | POA: Insufficient documentation

## 2015-05-20 DIAGNOSIS — I10 Essential (primary) hypertension: Secondary | ICD-10-CM

## 2015-05-20 LAB — BRAIN NATRIURETIC PEPTIDE: B Natriuretic Peptide: 22 pg/mL (ref 0.0–100.0)

## 2015-05-20 LAB — TSH: TSH: 1.158 u[IU]/mL (ref 0.350–4.500)

## 2015-05-20 LAB — RAPID URINE DRUG SCREEN, HOSP PERFORMED
Amphetamines: NOT DETECTED
BARBITURATES: NOT DETECTED
Benzodiazepines: NOT DETECTED
Cocaine: NOT DETECTED
Opiates: POSITIVE — AB
Tetrahydrocannabinol: NOT DETECTED

## 2015-05-20 LAB — ETHANOL: Alcohol, Ethyl (B): <5 mg/dL (ref <5)

## 2015-05-20 LAB — CBC
HEMATOCRIT: 43.8 % (ref 39.0–52.0)
Hemoglobin: 15.7 g/dL (ref 13.0–17.0)
MCH: 33.5 pg (ref 26.0–34.0)
MCHC: 35.8 g/dL (ref 30.0–36.0)
MCV: 93.4 fL (ref 78.0–100.0)
Platelets: 102 10*3/uL — ABNORMAL LOW (ref 150–400)
RBC: 4.69 MIL/uL (ref 4.22–5.81)
RDW: 12.9 % (ref 11.5–15.5)
WBC: 7 10*3/uL (ref 4.0–10.5)

## 2015-05-20 LAB — D-DIMER, QUANTITATIVE (NOT AT ARMC): D DIMER QUANT: 0.48 ug{FEU}/mL (ref 0.00–0.48)

## 2015-05-20 LAB — PROTIME-INR
INR: 1.23 (ref 0.00–1.49)
Prothrombin Time: 15.7 seconds — ABNORMAL HIGH (ref 11.6–15.2)

## 2015-05-20 LAB — COMPREHENSIVE METABOLIC PANEL
ALBUMIN: 3.5 g/dL (ref 3.5–5.0)
ALT: 124 U/L — ABNORMAL HIGH (ref 17–63)
ANION GAP: 9 (ref 5–15)
AST: 70 U/L — AB (ref 15–41)
Alkaline Phosphatase: 65 U/L (ref 38–126)
BUN: 10 mg/dL (ref 6–20)
CALCIUM: 8.3 mg/dL — AB (ref 8.9–10.3)
CO2: 22 mmol/L (ref 22–32)
Chloride: 105 mmol/L (ref 101–111)
Creatinine, Ser: 0.71 mg/dL (ref 0.61–1.24)
GFR calc non Af Amer: >60 mL/min (ref >60)
Glucose, Bld: 116 mg/dL — ABNORMAL HIGH (ref 65–99)
Potassium: 4.1 mmol/L (ref 3.5–5.1)
Sodium: 136 mmol/L (ref 135–145)
TOTAL PROTEIN: 6.6 g/dL (ref 6.5–8.1)
Total Bilirubin: 1.1 mg/dL (ref 0.3–1.2)

## 2015-05-20 LAB — APTT: APTT: 47 s — AB (ref 24–37)

## 2015-05-20 LAB — GLUCOSE, CAPILLARY
GLUCOSE-CAPILLARY: 106 mg/dL — AB (ref 65–99)
Glucose-Capillary: 106 mg/dL — ABNORMAL HIGH (ref 65–99)
Glucose-Capillary: 90 mg/dL (ref 65–99)

## 2015-05-20 LAB — TROPONIN I: Troponin I: <0.03 ng/mL (ref <0.031)

## 2015-05-20 LAB — MRSA PCR SCREENING: MRSA by PCR: NEGATIVE

## 2015-05-20 LAB — T4, FREE: Free T4: 0.78 ng/dL (ref 0.61–1.12)

## 2015-05-20 LAB — ANTITHROMBIN III: ANTITHROMB III FUNC: 81 % (ref 75–120)

## 2015-05-20 MED ORDER — LORAZEPAM 2 MG/ML IJ SOLN
0.0000 mg | Freq: Four times a day (QID) | INTRAMUSCULAR | Status: DC
Start: 2015-05-20 — End: 2015-05-21
  Administered 2015-05-20: 1 mg via INTRAVENOUS
  Filled 2015-05-20: qty 1

## 2015-05-20 MED ORDER — ALUM & MAG HYDROXIDE-SIMETH 200-200-20 MG/5ML PO SUSP: 30.0000 mL | Freq: Four times a day (QID) | ORAL | Status: DC | PRN

## 2015-05-20 MED ORDER — FOLIC ACID 1 MG PO TABS
1.0000 mg | ORAL_TABLET | Freq: Every day | ORAL | Status: DC
  Administered 2015-05-20 – 2015-05-21 (×2): 1 mg via ORAL
  Filled 2015-05-20 (×2): qty 1

## 2015-05-20 MED ORDER — ADULT MULTIVITAMIN W/MINERALS CH
1.0000 | ORAL_TABLET | Freq: Every day | ORAL | Status: DC
  Administered 2015-05-20 – 2015-05-21 (×2): 1 via ORAL
  Filled 2015-05-20 (×2): qty 1

## 2015-05-20 MED ORDER — LORAZEPAM 2 MG/ML IJ SOLN: 0.0000 mg | Freq: Two times a day (BID) | INTRAMUSCULAR | Status: DC

## 2015-05-20 MED ORDER — TRAZODONE HCL 50 MG PO TABS: 50.0000 mg | ORAL_TABLET | Freq: Every evening | ORAL | Status: DC | PRN

## 2015-05-20 MED ORDER — VITAMIN B-1 100 MG PO TABS
100.0000 mg | ORAL_TABLET | Freq: Every day | ORAL | Status: DC
  Administered 2015-05-20 – 2015-05-21 (×2): 100 mg via ORAL
  Filled 2015-05-20 (×2): qty 1

## 2015-05-20 MED ORDER — ESCITALOPRAM OXALATE 10 MG PO TABS
20.0000 mg | ORAL_TABLET | Freq: Every day | ORAL | Status: DC
  Administered 2015-05-20 – 2015-05-21 (×2): 20 mg via ORAL
  Filled 2015-05-20 (×3): qty 2

## 2015-05-20 MED ORDER — GUAIFENESIN-DM 100-10 MG/5ML PO SYRP: 5.0000 mL | ORAL_SOLUTION | ORAL | Status: DC | PRN

## 2015-05-20 MED ORDER — LORAZEPAM 1 MG PO TABS: 1.0000 mg | ORAL_TABLET | Freq: Four times a day (QID) | ORAL | Status: DC | PRN

## 2015-05-20 MED ORDER — AMLODIPINE BESYLATE 5 MG PO TABS
5.0000 mg | ORAL_TABLET | Freq: Every day | ORAL | Status: DC
  Administered 2015-05-20 – 2015-05-21 (×2): 5 mg via ORAL
  Filled 2015-05-20 (×3): qty 1

## 2015-05-20 MED ORDER — PNEUMOCOCCAL VAC POLYVALENT 25 MCG/0.5ML IJ INJ
0.5000 mL | INJECTION | INTRAMUSCULAR | Status: AC
  Administered 2015-05-21: 0.5 mL via INTRAMUSCULAR
  Filled 2015-05-20: qty 0.5

## 2015-05-20 MED ORDER — LORAZEPAM 2 MG/ML IJ SOLN: 1.0000 mg | Freq: Four times a day (QID) | INTRAMUSCULAR | Status: DC | PRN

## 2015-05-20 MED ORDER — ENOXAPARIN SODIUM 150 MG/ML ~~LOC~~ SOLN
130.0000 mg | Freq: Once | SUBCUTANEOUS | Status: AC
  Administered 2015-05-20: 130 mg via SUBCUTANEOUS
  Filled 2015-05-20: qty 1

## 2015-05-20 MED ORDER — THIAMINE HCL 100 MG/ML IJ SOLN
100.0000 mg | Freq: Every day | INTRAMUSCULAR | Status: DC
  Filled 2015-05-20: qty 2

## 2015-05-20 MED ORDER — OXYCODONE-ACETAMINOPHEN 5-325 MG PO TABS
2.0000 | ORAL_TABLET | Freq: Four times a day (QID) | ORAL | Status: DC | PRN
  Administered 2015-05-20 – 2015-05-21 (×4): 2 via ORAL
  Filled 2015-05-20 (×4): qty 2

## 2015-05-20 MED ORDER — SODIUM CHLORIDE 0.9 % IV SOLN: INTRAVENOUS | Status: DC

## 2015-05-20 MED ORDER — SODIUM CHLORIDE 0.9 % IV BOLUS (SEPSIS)
500.0000 mL | Freq: Once | INTRAVENOUS | Status: AC
  Administered 2015-05-20: 500 mL via INTRAVENOUS

## 2015-05-20 MED ORDER — PERFLUTREN LIPID MICROSPHERE
1.0000 mL | INTRAVENOUS | Status: AC | PRN
  Administered 2015-05-20: 2 mL via INTRAVENOUS
  Filled 2015-05-20: qty 10

## 2015-05-20 MED ORDER — ENOXAPARIN SODIUM 40 MG/0.4ML ~~LOC~~ SOLN: 40.0000 mg | SUBCUTANEOUS | Status: DC

## 2015-05-20 MED ORDER — MORPHINE SULFATE 2 MG/ML IJ SOLN
2.0000 mg | INTRAMUSCULAR | Status: DC | PRN
  Administered 2015-05-20 – 2015-05-21 (×4): 2 mg via INTRAVENOUS
  Filled 2015-05-20 (×4): qty 1

## 2015-05-20 MED ORDER — HYDROMORPHONE HCL 1 MG/ML IJ SOLN
2.0000 mg | Freq: Once | INTRAMUSCULAR | Status: AC
  Administered 2015-05-20: 2 mg via INTRAVENOUS
  Filled 2015-05-20: qty 2

## 2015-05-20 MED ORDER — LEVOFLOXACIN 750 MG PO TABS
750.0000 mg | ORAL_TABLET | Freq: Every day | ORAL | Status: DC
  Administered 2015-05-20 – 2015-05-21 (×2): 750 mg via ORAL
  Filled 2015-05-20 (×2): qty 1

## 2015-05-20 MED ORDER — ENOXAPARIN SODIUM 150 MG/ML ~~LOC~~ SOLN
130.0000 mg | Freq: Two times a day (BID) | SUBCUTANEOUS | Status: DC
  Administered 2015-05-20: 130 mg via SUBCUTANEOUS
  Filled 2015-05-20: qty 1

## 2015-05-20 MED ORDER — ONDANSETRON HCL 4 MG/2ML IJ SOLN
4.0000 mg | Freq: Three times a day (TID) | INTRAMUSCULAR | Status: DC | PRN
Start: 2015-05-20 — End: 2015-05-21

## 2015-05-20 MED ORDER — SODIUM CHLORIDE 0.9 % IJ SOLN
3.0000 mL | Freq: Two times a day (BID) | INTRAMUSCULAR | Status: DC
  Administered 2015-05-20 (×2): 3 mL via INTRAVENOUS

## 2015-05-20 MED ORDER — HYDRALAZINE HCL 20 MG/ML IJ SOLN: 5.0000 mg | INTRAMUSCULAR | Status: DC | PRN

## 2015-05-20 NOTE — Progress Notes (Signed)
Bilateral Lower Ext. Venous Duplex Completed. Negative for deep and superficial vein thrombosis in both lower extremities. Nicholas Price, BS, RDMS, RVT

## 2015-05-20 NOTE — Progress Notes (Signed)
UR Completed Jaccob Czaplicki Graves-Bigelow, RN,BSN [phone]  

## 2015-05-20 NOTE — Progress Notes (Signed)
[facility] PROGRESS NOTE  Nicholas Price [id number] DOB: [birth year] DOA: 05/19/2015 PCP: Karie ChimeraEESE,BETTI D, MD  Assessment/Plan: 1-Chest pain;pleuritic.  CT angio, negative for PE, but unable to rule out PE distal pulmonary artery.  ECHO pending.  Doppler LE negative Troponin times one negative.  Hypercoagulable panel in process.  Discussed with Dr Kendrick FriesMcquaid. Order D dimer, if negative, no need to treat for PE. If D dimer positive to consult pulmonary.  D dimer order.  Continue with lovenox for now.  Incentive spirometry.   2-Acute Hypoxic Respiratory Failure;  Differential pna vs Questionable PE.  Continue with Lovenox for now.  CT angio inconclusive.  Doppler LE negative.  Discussed with Dr Kendrick FriesMcquaid. Order D dimer, if negative, no need to treat for PE. If D dimer positive to consult pulmonary.   3-Mediastinal and hilar lymphadenopathy;  Needs outpatient follow up. Patient aware.   HTN  Depression and anxiety:  Patient is a anxious on admission, which is likely due to alcohol abuse, though patient denies drinking alcohol. -Continue Trazodone, Lexapro  Mild chronic diarrhea: Given sweating a lot and feeling hot all the time, will rule out thyroid dysfunction -TSH 1.1 and free T4 0.78. Normal.   Alcohol abuse: -CIWA protocol -check uds and HIV Ab   Code Status: Full Code.  Family Communication: care discussed with patient.  Disposition Plan: Remain inpatient. Likely home at time of dioscharge   Consultants:    Procedures:  Doppler LE;   Antibiotics:  none  HPI/Subjective: Still with chest pain and dyspnea. Started to cough some  Objective: Filed Vitals:   05/20/15 0619  BP: 151/91  Pulse: 88  Temp: 97.9 F (36.6 C)  Resp: 28   No intake or output data in the 24 hours ending 05/20/15 0805 Filed Weights   05/20/15 0214  Weight: 131.906 kg (290 lb 12.8 oz)    Exam:   General:  NAD  Cardiovascular: S 1, S 2 RRR  Respiratory: few crackles.    Abdomen: BS present, soft, nt  Musculoskeletal: no edema  Data Reviewed: Basic Metabolic Panel:  Recent Labs Lab 05/19/15 2208 05/20/15 0435  NA 136 136  K 3.8 4.1  CL 102 105  CO2 20* 22  GLUCOSE 127* 116*  BUN 11 10  CREATININE 1.02 0.71  CALCIUM 8.8* 8.3*   Liver Function Tests:  Recent Labs Lab 05/20/15 0435  AST 70*  ALT 124*  ALKPHOS 65  BILITOT 1.1  PROT 6.6  ALBUMIN 3.5   No results for input(s): LIPASE, AMYLASE in the last 168 hours. No results for input(s): AMMONIA in the last 168 hours. CBC:  Recent Labs Lab 05/19/15 2208 05/20/15 0435  WBC 9.9 7.0  HGB 17.2* 15.7  HCT 48.8 43.8  MCV 91.6 93.4  PLT 154 102*   Cardiac Enzymes:  Recent Labs Lab 05/20/15 0428  TROPONINI <0.03   BNP (last 3 results)  Recent Labs  05/19/15 2208  BNP 22.0    ProBNP (last 3 results) No results for input(s): PROBNP in the last 8760 hours.  CBG:  Recent Labs Lab 05/20/15 0757  GLUCAP 106*    Recent Results (from the past 240 hour(s))  MRSA PCR Screening     Status: None   Collection Time: 05/20/15  2:49 AM  Result Value Ref Range Status   MRSA by PCR NEGATIVE NEGATIVE Final    Comment:        The GeneXpert MRSA Assay (FDA approved for NASAL specimens only), is one component of a  comprehensive MRSA colonization surveillance program. It is not intended to diagnose MRSA infection nor to guide or monitor treatment for MRSA infections.      Studies: Ct Angio Chest Pe W/cm &/or Wo Cm  05/20/2015   CLINICAL DATA:  Chest pain starting at 2 p.m.  EXAM: CT ANGIOGRAPHY CHEST WITH CONTRAST  TECHNIQUE: Multidetector CT imaging of the chest was performed using the standard protocol during bolus administration of intravenous contrast. Multiplanar CT image reconstructions and MIPs were obtained to evaluate the vascular anatomy.  CONTRAST:  80mL OMNIPAQUE IOHEXOL 350 MG/ML SOLN  COMPARISON:  None.  FINDINGS: THORACIC INLET/BODY WALL:  No acute  abnormality.  MEDIASTINUM:  Left ventricular enlargement. No pericardial effusion. Negative aorta.  Significantly limited evaluation of the distal segmental pulmonary arteries due to motion at the bases.  Mildly enlarged mediastinal lymph nodes, most notable in the hilar and subcarinal stations. Hilar nodes measure up to 14 mm short axis.  LUNG WINDOWS:  Subpleural airspace disease in the lingula. There is a small reactive left pleural effusion. Mild atelectatic appearing opacity present in the left posterior costophrenic sulcus.  UPPER ABDOMEN:  Prominent lymph node near the liver caudate measuring 12 mm short axis.  OSSEOUS:  No acute fracture. No suspicious lytic or blastic lesions. Remote posterior right fourth rib fracture.  Review of the MIP images confirms the above findings.  IMPRESSION: 1. No pulmonary embolism is identified, but motion precludes evaluation of pulmonary arteries beyond the distal segmental level at the bases. 2. Airspace disease in the lingula with small pleural effusion, potentially pneumonia. Note that a lung infarct would have the same appearance and unfortunately the associated pulmonary arteries are completely obscured by patient motion. If acute pulmonary embolism is still of clinical concern, suggest lower extremity Dopplers. 3. Left ventricular enlargement. 4. Mild mediastinal and hilar lymphadenopathy. Sarcoid could have this appearance, recommend outpatient workup.   Electronically Signed   By: Marnee Spring M.D.   On: 05/20/2015 00:14   Dg Chest Port 1 View  05/19/2015   CLINICAL DATA:  Chest pain.  Shortness of breath.  EXAM: PORTABLE CHEST - 1 VIEW  COMPARISON:  None.  FINDINGS: Borderline enlarged cardiac silhouette and mediastinal contours, potentially accentuated due to AP projection and decreased lung volumes. Bibasilar heterogeneous opacities left greater right. Pulmonary congestion without frank evidence of edema. No definite pleural effusion or pneumothorax. Old  fracture involving the posterior lateral aspect of the right fourth rib.  IMPRESSION: Bibasilar heterogeneous opacities, left greater than right, atelectasis versus infiltrate. Further evaluation with a PA and lateral chest radiograph may be obtained as clinically indicated.   Electronically Signed   By: Simonne Come M.D.   On: 05/19/2015 22:15    Scheduled Meds: . amLODipine  5 mg Oral Daily  . enoxaparin (LOVENOX) injection  130 mg Subcutaneous BID  . escitalopram  20 mg Oral Daily  . folic acid  1 mg Oral Daily  . LORazepam  0-4 mg Intravenous Q6H   Followed by  . [START ON 05/22/2015] LORazepam  0-4 mg Intravenous Q12H  . multivitamin with minerals  1 tablet Oral Daily  . [START ON 05/21/2015] pneumococcal 23 valent vaccine  0.5 mL Intramuscular Tomorrow-1000  . sodium chloride  3 mL Intravenous Q12H  . thiamine  100 mg Oral Daily   Or  . thiamine  100 mg Intravenous Daily   Continuous Infusions: . sodium chloride 100 mL/hr at 05/20/15 9811    Principal Problem:   Chest pain Active  Problems:   Alcohol abuse, continuous   Hypertension   Depression   Anxiety   Diarrhea    Time spent:35 minutes.     Hartley Barefoot A  [facility] Pager [phone]. If 7PM-7AM, please contact night-coverage at [email or web address], password [facility] 05/20/2015, 8:05 AM  LOS: 0 days

## 2015-05-20 NOTE — H&P (Signed)
[facility] History and Physical  Riggins Cisek ZOX:[id number] DOB: Jul 22, 1971 DOA: 05/19/2015  Referring physician: ED physician PCP: Karie Chimera, MD  Specialists:   Chief Complaint: Chest pain  HPI: Nicholas Price is a 44 y.o. male with PMH of hypertension not on medications, depression, anxiety, alcohol abuse, who presents with chest pain  Patient reports that he started having mild chest pain at about 2 PM, he did not pay much attention. His chest pain has been progressively getting worse at 8:30 PM. It is located in the left side of her chest, persistent, pleuritic. It is aggravated by deep breath. It is associated with shortness of breath, but not with cough, fever or chills. He does not have pain over calf areas. No recent long distant traveling. He also has mild nausea, but no abdominal pain. Reports that she has a chronic mild diarrhea with loose stools for more than one year. She feels hot all the time, and sweats a lot always. No previous diagnosis of thyroid dysfunction. Of note, patient carries diagnosis of alcohol abuse, but he denies drinking alcohol today.   Currently patient denies fever, chills, running nose, ear pain, headaches, cough, abdominal pain, diarrhea, constipation, dysuria, urgency, frequency, hematuria, skin rashes, joint pain or leg swelling. No unilateral weakness, numbness or tingling sensations. No vision change or hearing loss.  In ED, patient was found to have WBC 9.9, negative troponin, BNP 22, temperature normal, tachycardia, oxygen saturation to 89% on room air. Electrolytes and renal function okay. CAT showed no pulmonary embolism, could not get evaluation of pulmonary arteries beyond the distal segmental level at the bases due to motion effect, possible arspace disease in the lingula with small pleural effusion and mild mediastinal and hilar lymphadenopathy. CXR showed bibasilar heterogeneous opacities, left greater than right, atelectasis versus  infiltrate? Patient is admitted to inpatient for further evaluation and treatment.  Where does patient live?   At home    Can patient participate in ADLs?  Yes     Review of Systems:   General: no fevers, chills, no changes in body weight, has fatigue. Sweating a lot. HEENT: no blurry vision, hearing changes or sore throat Pulm: has dyspnea, no coughing, wheezing CV: has chest pain, no palpitations Abd: no nausea, vomiting, abdominal pain, diarrhea, constipation GU: no dysuria, burning on urination, increased urinary frequency, hematuria  Ext: no leg edema Neuro: no unilateral weakness, numbness, or tingling, no vision change or hearing loss Skin: no rash MSK: No muscle spasm, no deformity, no limitation of range of movement in spin Heme: No easy bruising.  Travel history: No recent long distant travel.  Allergy: No Known Allergies  Past Medical History  Diagnosis Date  . Hypertension   . Alcohol abuse   . Depression     History reviewed. No pertinent past surgical history.  Social History:  reports that he has been passively smoking Cigarettes.  He does not have any smokeless tobacco history on file. He reports that he drinks alcohol. He reports that he does not use illicit drugs.  Family History:  Family History  Problem Relation Age of Onset  . Anxiety disorder Brother      Prior to Admission medications   Medication Sig Start Date End Date Taking? Authorizing Provider  escitalopram (LEXAPRO) 20 MG tablet Take 1 tablet (20 mg total) by mouth daily. For depression. 07/14/12 05/19/15 Yes Neil T Mashburn, PA-C  traZODone (DESYREL) 50 MG tablet Take 50 mg by mouth at bedtime as needed for sleep.  Yes Historical Provider, MD    Physical Exam: Filed Vitals:   05/20/15 0030 05/20/15 0115 05/20/15 0145 05/20/15 0214  BP:  152/95 158/82 178/103  Pulse:  99 91 86  Temp:    98 F (36.7 C)  TempSrc:    Oral  Resp:  Height:     (1.88 m)  Weight:    131.906  kg (290 lb 12.8 oz)  SpO2: 93% 95% 94% 96%   General: Not in acute distress, patient is anxious HEENT:       Eyes: PERRL, EOMI, no scleral icterus.       ENT: No discharge from the ears and nose, no pharynx injection, no tonsillar enlargement.        Neck: No JVD, no bruit, no mass felt. Heme: No neck lymph node enlargement. Cardiac: S1/S2, RRR, tachycardia, No murmurs, No gallops or rubs. Pulm:  No rales, wheezing, rhonchi or rubs. Abd: Soft, nondistended, nontender, no rebound pain, no organomegaly, BS present. Ext: No pitting leg edema bilaterally. 2+DP/PT pulse bilaterally. Musculoskeletal: No joint deformities, No joint redness or warmth, no limitation of ROM in spin. Skin: No rashes.  Neuro: Alert, oriented X3, cranial nerves II-XII grossly intact, muscle strength 5/5 in all extremities, sensation to light touch intact.  Psych: Patient is not psychotic, no suicidal or hemocidal ideation.  Labs on Admission:  Basic Metabolic Panel:  Recent Labs Lab 05/19/15 2208  NA 136  K 3.8  CL 102  CO2 20*  GLUCOSE 127*  BUN 11  CREATININE 1.02  CALCIUM 8.8*   Liver Function Tests: No results for input(s): AST, ALT, ALKPHOS, BILITOT, PROT, ALBUMIN in the last 168 hours. No results for input(s): LIPASE, AMYLASE in the last 168 hours. No results for input(s): AMMONIA in the last 168 hours. CBC:  Recent Labs Lab 05/19/15 2208  WBC 9.9  HGB 17.2*  HCT 48.8  MCV 91.6  PLT 154   Cardiac Enzymes: No results for input(s): CKTOTAL, CKMB, CKMBINDEX, TROPONINI in the last 168 hours.  BNP (last 3 results)  Recent Labs  05/19/15 2208  BNP 22.0    ProBNP (last 3 results) No results for input(s): PROBNP in the last 8760 hours.  CBG: No results for input(s): GLUCAP in the last 168 hours.  Radiological Exams on Admission: Ct Angio Chest Pe W/cm &/or Wo Cm  05/20/2015   CLINICAL DATA:  Chest pain starting at 2 p.m.  EXAM: CT ANGIOGRAPHY CHEST WITH CONTRAST  TECHNIQUE:  Multidetector CT imaging of the chest was performed using the standard protocol during bolus administration of intravenous contrast. Multiplanar CT image reconstructions and MIPs were obtained to evaluate the vascular anatomy.  CONTRAST:  80mL OMNIPAQUE IOHEXOL 350 MG/ML SOLN  COMPARISON:  None.  FINDINGS: THORACIC INLET/BODY WALL:  No acute abnormality.  MEDIASTINUM:  Left ventricular enlargement. No pericardial effusion. Negative aorta.  Significantly limited evaluation of the distal segmental pulmonary arteries due to motion at the bases.  Mildly enlarged mediastinal lymph nodes, most notable in the hilar and subcarinal stations. Hilar nodes measure up to 14 mm short axis.  LUNG WINDOWS:  Subpleural airspace disease in the lingula. There is a small reactive left pleural effusion. Mild atelectatic appearing opacity present in the left posterior costophrenic sulcus.  UPPER ABDOMEN:  Prominent lymph node near the liver caudate measuring 12 mm short axis.  OSSEOUS:  No acute fracture. No suspicious lytic or blastic lesions. Remote posterior right fourth rib fracture.  Review of the  MIP images confirms the above findings.  IMPRESSION: 1. No pulmonary embolism is identified, but motion precludes evaluation of pulmonary arteries beyond the distal segmental level at the bases. 2. Airspace disease in the lingula with small pleural effusion, potentially pneumonia. Note that a lung infarct would have the same appearance and unfortunately the associated pulmonary arteries are completely obscured by patient motion. If acute pulmonary embolism is still of clinical concern, suggest lower extremity Dopplers. 3. Left ventricular enlargement. 4. Mild mediastinal and hilar lymphadenopathy. Sarcoid could have this appearance, recommend outpatient workup.   Electronically Signed   By: Marnee SpringJonathon  Watts M.D.   On: 05/20/2015 00:14   Dg Chest Port 1 View  05/19/2015   CLINICAL DATA:  Chest pain.  Shortness of breath.  EXAM: PORTABLE  CHEST - 1 VIEW  COMPARISON:  None.  FINDINGS: Borderline enlarged cardiac silhouette and mediastinal contours, potentially accentuated due to AP projection and decreased lung volumes. Bibasilar heterogeneous opacities left greater right. Pulmonary congestion without frank evidence of edema. No definite pleural effusion or pneumothorax. Old fracture involving the posterior lateral aspect of the right fourth rib.  IMPRESSION: Bibasilar heterogeneous opacities, left greater than right, atelectasis versus infiltrate. Further evaluation with a PA and lateral chest radiograph may be obtained as clinically indicated.   Electronically Signed   By: Simonne ComeJohn  Watts M.D.   On: 05/19/2015 22:15    EKG: Independently reviewed.  Abnormal findings: T-wave inversion in lead III/aVF, QTc interval 483, early R-wave progression. no old EKG to compare with.  Assessment/Plan Principal Problem:   Chest pain Active Problems:   Alcohol abuse, continuous   Hypertension   Depression   Anxiety   Diarrhea  Chest pain: Etiology is not clear. CT angiogram did not show massive PE, but could not get evaluation of pulmonary arteries beyond the distal segmental level at the bases due to motion effect. There is possible arspace disease in the lingula CTA, but patient dose have symptoms of pneumonia, such as cough, fever, leukocytosis. Clinically patient seems to be more likely to have PE than pneumonia. Will start Lovenox empirically for PE, and hold antibodies unless patient develops fever.  -will admit to sdu -Lovenox was started in ED, will continue -2D echocardiogram ordered -LE dopplers ordered to evaluate for DVT -repeat EKG in a.m. -trop x 3 -Hypercoag panel -pain control: When necessary Percocet and morphine -Will get blood culture and start antibodies if patient develops fever  HTN: No taking medications for a long time. Blood pressure is elevated at 162/114  -start low-dose of amlodipine 5 mg daily -IV hydralazine  when necessary  Depression and anxiety: No suicidal homicidal ideations. Patient is a anxious on admission, which is likely due to alcohol abuse, though patient denies drinking alcohol. -Continue Trazodone, Lexapro  Mild chronic diarrhea: Given sweating a lot and feeling hot all the time, will rule out thyroid dysfunction -Check TSH and free T4  Alcohol abuse: -CIWA protocol -check uds and HIV Ab  DVT ppx: on lovenox  Code Status: Full code Family Communication:   Yes, patient's  fianc  at bed side Disposition Plan: Admit to inpatient   Date of Service 05/20/2015    Lorretta HarpIU, Ahri Olson [facility] Pager [phone]  If 7PM-7AM, please contact night-coverage [email or web address] Password [email or web address] 05/20/2015, 3:44 AM

## 2015-05-20 NOTE — ED Provider Notes (Signed)
CSN: [id number]     Arrival date & time 05/19/15  2151 History   First MD Initiated Contact with Patient 05/19/15 2215     Chief Complaint  Patient presents with  . Chest Pain     (Consider location/radiation/quality/duration/timing/severity/associated sxs/prior Treatment) Patient is a 44 y.o. male presenting with chest pain. The history is provided by the patient and the spouse.  Chest Pain Associated symptoms: diaphoresis, nausea and shortness of breath   Associated symptoms: no abdominal pain, no back pain, no fever, no headache and not vomiting    patient onset of chest pain left lower anterior intermittently around to today. And started developing shortness of breath and nausea and severe pain more around 8 this evening. Patient has no history of any injury. No fevers. Pain not made better or worse by anything other than made worse by taking a deep breath. No history of similar pain. Pain currently is 10 out of 10. Sharp in nature. Nonradiating.  Past Medical History  Diagnosis Date  . Hypertension    History reviewed. No pertinent past surgical history. No family history on file. History  Substance Use Topics  . Smoking status: Passive Smoke Exposure - Never Smoker    Types: Cigarettes  . Smokeless tobacco: Not on file  . Alcohol Use: Yes    Review of Systems  Constitutional: Positive for diaphoresis. Negative for fever.  HENT: Negative for congestion.   Eyes: Negative for visual disturbance.  Respiratory: Positive for shortness of breath.   Cardiovascular: Positive for chest pain. Negative for leg swelling.  Gastrointestinal: Positive for nausea. Negative for vomiting and abdominal pain.  Genitourinary: Negative for dysuria.  Musculoskeletal: Negative for back pain and neck pain.  Skin: Negative for rash.  Neurological: Negative for headaches.  Hematological: Does not bruise/bleed easily.  Psychiatric/Behavioral: Negative for confusion.      Allergies  Review of  patient's allergies indicates no known allergies.  Home Medications   Prior to Admission medications   Medication Sig Start Date End Date Taking? Authorizing Provider  escitalopram (LEXAPRO) 20 MG tablet Take 1 tablet (20 mg total) by mouth daily. For depression. 07/14/12 05/19/15 Yes Neil T Mashburn, PA-C  traZODone (DESYREL) 50 MG tablet Take 50 mg by mouth at bedtime as needed for sleep.   Yes Historical Provider, MD   BP 162/114 mmHg  Pulse 100  Temp(Src) 98.2 F (36.8 C) (Oral)  Resp 29  SpO2 92% Physical Exam  Constitutional: He is oriented to person, place, and time. He appears well-developed and well-nourished. He appears distressed.  HENT:  Head: Normocephalic and atraumatic.  Mouth/Throat: Oropharynx is clear and moist.  Eyes: Conjunctivae and EOM are normal. Pupils are equal, round, and reactive to light.  Neck: Normal range of motion.  Cardiovascular: Regular rhythm and normal heart sounds.   No murmur heard. Tachycardic  Pulmonary/Chest: Breath sounds normal. He is in respiratory distress. He has no wheezes. He has no rales. He exhibits tenderness.  Abdominal: Soft. Bowel sounds are normal. There is no tenderness.  Musculoskeletal: Normal range of motion. He exhibits no edema.  Neurological: He is alert and oriented to person, place, and time. No cranial nerve deficit. Coordination normal.  Skin: Skin is warm. No rash noted.  Nursing note and vitals reviewed.   ED Course  Procedures (including critical care time) Labs Review Labs Reviewed  CBC - Abnormal; Notable for the following:    Hemoglobin 17.2 (*)    All other components within normal limits  BASIC  METABOLIC PANEL - Abnormal; Notable for the following:    CO2 20 (*)    Glucose, Bld 127 (*)    Calcium 8.8 (*)    All other components within normal limits  BRAIN NATRIURETIC PEPTIDE  I-STAT TROPOININ, ED  I-STAT TROPOININ, ED   Results for orders placed or performed during the hospital encounter of  05/19/15  CBC  Result Value Ref Range   WBC 9.9 4.0 - 10.5 K/uL   RBC 5.33 4.22 - 5.81 MIL/uL   Hemoglobin 17.2 (H) 13.0 - 17.0 g/dL   HCT 29.5 62.1 - 30.8 %   MCV 91.6 78.0 - 100.0 fL   MCH 32.3 26.0 - 34.0 pg   MCHC 35.2 30.0 - 36.0 g/dL   RDW 65.7 84.6 - 96.2 %   Platelets 154 150 - 400 K/uL  Basic metabolic panel  Result Value Ref Range   Sodium 136 135 - 145 mmol/L   Potassium 3.8 3.5 - 5.1 mmol/L   Chloride 102 101 - 111 mmol/L   CO2 20 (L) 22 - 32 mmol/L   Glucose, Bld 127 (H) 65 - 99 mg/dL   BUN 11 6 - 20 mg/dL   Creatinine, Ser 9.52 0.61 - 1.24 mg/dL   Calcium 8.8 (L) 8.9 - 10.3 mg/dL   GFR calc non Af Amer >60 >60 mL/min   GFR calc Af Amer >60 >60 mL/min   Anion gap 14 5 - 15  BNP (order ONLY if patient complains of dyspnea/SOB AND you have documented it for THIS visit)  Result Value Ref Range   B Natriuretic Peptide 22.0 0.0 - 100.0 pg/mL  I-stat troponin, ED  (not at [facility])  Result Value Ref Range   Troponin i, poc 0.00 0.00 - 0.08 ng/mL   Comment 3          I-Stat Troponin, ED (not at [facility])  Result Value Ref Range   Troponin i, poc 0.00 0.00 - 0.08 ng/mL   Comment 3             Imaging Review Ct Angio Chest Pe W/cm &/or Wo Cm  05/20/2015   CLINICAL DATA:  Chest pain starting at 2 p.m.  EXAM: CT ANGIOGRAPHY CHEST WITH CONTRAST  TECHNIQUE: Multidetector CT imaging of the chest was performed using the standard protocol during bolus administration of intravenous contrast. Multiplanar CT image reconstructions and MIPs were obtained to evaluate the vascular anatomy.  CONTRAST:  80mL OMNIPAQUE IOHEXOL 350 MG/ML SOLN  COMPARISON:  None.  FINDINGS: THORACIC INLET/BODY WALL:  No acute abnormality.  MEDIASTINUM:  Left ventricular enlargement. No pericardial effusion. Negative aorta.  Significantly limited evaluation of the distal segmental pulmonary arteries due to motion at the bases.  Mildly enlarged mediastinal lymph nodes, most notable in the hilar and subcarinal  stations. Hilar nodes measure up to 14 mm short axis.  LUNG WINDOWS:  Subpleural airspace disease in the lingula. There is a small reactive left pleural effusion. Mild atelectatic appearing opacity present in the left posterior costophrenic sulcus.  UPPER ABDOMEN:  Prominent lymph node near the liver caudate measuring 12 mm short axis.  OSSEOUS:  No acute fracture. No suspicious lytic or blastic lesions. Remote posterior right fourth rib fracture.  Review of the MIP images confirms the above findings.  IMPRESSION: 1. No pulmonary embolism is identified, but motion precludes evaluation of pulmonary arteries beyond the distal segmental level at the bases. 2. Airspace disease in the lingula with small pleural effusion, potentially pneumonia. Note that  a lung infarct would have the same appearance and unfortunately the associated pulmonary arteries are completely obscured by patient motion. If acute pulmonary embolism is still of clinical concern, suggest lower extremity Dopplers. 3. Left ventricular enlargement. 4. Mild mediastinal and hilar lymphadenopathy. Sarcoid could have this appearance, recommend outpatient workup.   Electronically Signed   By: Marnee Spring M.D.   On: 05/20/2015 00:14   Dg Chest Port 1 View  05/19/2015   CLINICAL DATA:  Chest pain.  Shortness of breath.  EXAM: PORTABLE CHEST - 1 VIEW  COMPARISON:  None.  FINDINGS: Borderline enlarged cardiac silhouette and mediastinal contours, potentially accentuated due to AP projection and decreased lung volumes. Bibasilar heterogeneous opacities left greater right. Pulmonary congestion without frank evidence of edema. No definite pleural effusion or pneumothorax. Old fracture involving the posterior lateral aspect of the right fourth rib.  IMPRESSION: Bibasilar heterogeneous opacities, left greater than right, atelectasis versus infiltrate. Further evaluation with a PA and lateral chest radiograph may be obtained as clinically indicated.    Electronically Signed   By: Simonne Come M.D.   On: 05/19/2015 22:15     EKG Interpretation   Date/Time:  Thursday May 19 2015 21:57:04 EDT Ventricular Rate:  107 PR Interval:  144 QRS Duration: 104 QT Interval:  362 QTC Calculation: 483 R Axis:   -20 Text Interpretation:  Sinus tachycardia Otherwise normal ECG No previous  ECGs available Confirmed by Salah Burlison  MD, Jahleah Mariscal [phone]) on 05/19/2015  10:23:12 PM      MDM   Final diagnoses:  Chest pain    Clinically patient very concerning for PE. CT angiogram shows evidence of a pleural effusion small on the left side. Cannot completely rule out PE. There was some motion artifact so they cannot say 100% is not a pulmonary embolus. No large pulmonary embolus. Patient with a little bit of borderline oxygen saturations around 92% started on 2 L. Patient tachycardic patient diaphoretic patient with significant left-sided pleuritic chest pain. From a cardiac standpoint troponins negative EKG without acute changes on sinus tachycardia. Regular chest x-ray raises concern for something going on on the lungs on the left side down low. Hasn't been any cough no fevers. So clinically does not sound like a developing pneumonia.   Them also the report of chest pain is been quite severe and very difficult to control peers taken multiple doses of hydromorphone. Will start on Lovenox. And consult medicine for admission.    Vanetta Mulders, MD 05/20/15 0030

## 2015-05-20 NOTE — Progress Notes (Signed)
Echocardiogram 2D Echocardiogram has been performed.  Dorothey BasemanReel, Hayk Divis M 05/20/2015, 1:15 PM

## 2015-05-20 NOTE — Progress Notes (Signed)
I have reviewed the admission data. I have done this from the cardiology perspective, as cardiology is responsible for seeing all chest pain unit patients early in the morning to see if further cardiac workup is needed. At this point, there is no suggestion that the current problem is cardiac in origin. Full cardiac consultation is not necessary. Cardiology is available if any help as needed.  Jerral BonitoJeff Merwyn Hodapp, MD

## 2015-05-20 NOTE — Progress Notes (Signed)
ANTICOAGULATION CONSULT NOTE - Initial Consult  Pharmacy Consult for Lovenox Indication: R/O PE  No Known Allergies  Patient Measurements: Height: 6\' 2"  (188 cm) Weight: 290 lb 12.8 oz (131.906 kg) IBW/kg (Calculated) : 82.2  Vital Signs: Temp: 98 F (36.7 C) (05/27 0214) Temp Source: Oral (05/27 0214) BP: 178/103 mmHg (05/27 0214) Pulse Rate: 86 (05/27 0214)  Labs:  Recent Labs  05/19/15 2208  HGB 17.2*  HCT 48.8  PLT 154  CREATININE 1.02    Estimated Creatinine Clearance: 136.2 mL/min (by C-G formula based on Cr of 1.02).   Medical History: Past Medical History  Diagnosis Date  . Hypertension   . Alcohol abuse   . Depression     Medications:  Lexapro  Trazodone  Assessment: 44 y.o. male with chest pain/SOB, Chest CT unable to rule out PE, for Lovenox.  Lovenox 130 mg SQ given in ED at 0115  Goal of Therapy:  Full anticoagulation with Lovenox Monitor platelets by anticoagulation protocol: Yes   Plan:  Lovenox 130 mg SQ q12h  Nicholas Price, Nicholas Price 05/20/2015,2:19 AM

## 2015-05-20 NOTE — ED Notes (Signed)
Contacted Dr. Clyde LundborgNiu about pt bed placement - will be requesting stepdown bed.

## 2015-05-20 NOTE — ED Notes (Signed)
Pt O2 sat dropped to 88-89% - pt placed on 4L [place], sats 93%.

## 2015-05-21 DIAGNOSIS — R0789 Other chest pain: Secondary | ICD-10-CM | POA: Diagnosis not present

## 2015-05-21 DIAGNOSIS — F1010 Alcohol abuse, uncomplicated: Secondary | ICD-10-CM

## 2015-05-21 LAB — BASIC METABOLIC PANEL
ANION GAP: 6 (ref 5–15)
BUN: 9 mg/dL (ref 6–20)
CHLORIDE: 104 mmol/L (ref 101–111)
CO2: 26 mmol/L (ref 22–32)
Calcium: 8.4 mg/dL — ABNORMAL LOW (ref 8.9–10.3)
Creatinine, Ser: 0.75 mg/dL (ref 0.61–1.24)
GFR calc non Af Amer: >60 mL/min (ref >60)
GLUCOSE: 98 mg/dL (ref 65–99)
Potassium: 3.7 mmol/L (ref 3.5–5.1)
Sodium: 136 mmol/L (ref 135–145)

## 2015-05-21 LAB — STREP PNEUMONIAE URINARY ANTIGEN: Strep Pneumo Urinary Antigen: NEGATIVE

## 2015-05-21 LAB — PROTEIN C, TOTAL: Protein C, Total: 67 % — ABNORMAL LOW (ref 70–140)

## 2015-05-21 LAB — CBC
HEMATOCRIT: 42.2 % (ref 39.0–52.0)
Hemoglobin: 15 g/dL (ref 13.0–17.0)
MCH: 32.8 pg (ref 26.0–34.0)
MCHC: 35.5 g/dL (ref 30.0–36.0)
MCV: 92.3 fL (ref 78.0–100.0)
Platelets: 94 10*3/uL — ABNORMAL LOW (ref 150–400)
RBC: 4.57 MIL/uL (ref 4.22–5.81)
RDW: 12.6 % (ref 11.5–15.5)
WBC: 3.8 10*3/uL — AB (ref 4.0–10.5)

## 2015-05-21 LAB — MAGNESIUM: Magnesium: 1.8 mg/dL (ref 1.7–2.4)

## 2015-05-21 LAB — LUPUS ANTICOAGULANT PANEL
DRVVT: 41.7 s (ref 0.0–55.1)
PTT LA: 45.7 s (ref 0.0–50.0)

## 2015-05-21 LAB — GLUCOSE, CAPILLARY: Glucose-Capillary: 94 mg/dL (ref 65–99)

## 2015-05-21 LAB — PROTEIN C ACTIVITY: Protein C Activity: 79 % (ref 74–151)

## 2015-05-21 LAB — PROTEIN S ACTIVITY: PROTEIN S ACTIVITY: 83 % (ref 60–145)

## 2015-05-21 LAB — PROTEIN S, TOTAL: PROTEIN S AG TOTAL: 110 % (ref 58–150)

## 2015-05-21 MED ORDER — AMLODIPINE BESYLATE 5 MG PO TABS: 5.0000 mg | ORAL_TABLET | Freq: Every day | ORAL | Status: DC

## 2015-05-21 MED ORDER — OXYCODONE-ACETAMINOPHEN 5-325 MG PO TABS: 2.0000 | ORAL_TABLET | Freq: Four times a day (QID) | ORAL | Status: DC | PRN

## 2015-05-21 MED ORDER — MAGNESIUM SULFATE 2 GM/50ML IV SOLN
2.0000 g | Freq: Once | INTRAVENOUS | Status: AC
  Administered 2015-05-21: 2 g via INTRAVENOUS
  Filled 2015-05-21: qty 50

## 2015-05-21 MED ORDER — ESCITALOPRAM OXALATE 20 MG PO TABS: 20.0000 mg | ORAL_TABLET | Freq: Every day | ORAL | Status: DC

## 2015-05-21 MED ORDER — LEVOFLOXACIN 750 MG PO TABS: 750.0000 mg | ORAL_TABLET | Freq: Every day | ORAL | Status: DC

## 2015-05-21 NOTE — Discharge Summary (Signed)
Physician Discharge Summary  Nicholas Price ZOX:[id number] DOB: Feb 10, 1971 DOA: 05/19/2015  PCP: Karie Chimera, MD  Admit date: 05/19/2015 Discharge date: 05/21/2015  Time spent: 35 minutes  Recommendations for Outpatient Follow-up:  1. Please follow hypercoagulable panel.  2. Needs repeat CBC to follow platelet level.  3. Needs repeat CT chest to follow lymphadenopathy  Discharge Diagnoses:    Chest pain, secondary to PNA   PNA   Alcohol abuse, continuous   Hypertension   Depression   Anxiety   Diarrhea   Precordial chest pain   Discharge Condition: Stable  Diet recommendation: hearth healthy  Filed Weights   05/20/15 0214 05/21/15 0424  Weight: 131.906 kg (290 lb 12.8 oz) 133.494 kg (294 lb 4.8 oz)    History of present illness:  Nicholas Price is a 44 y.o. male with PMH of hypertension not on medications, depression, anxiety, alcohol abuse, who presents with chest pain  Patient reports that he started having mild chest pain at about 2 PM, he did not pay much attention. His chest pain has been progressively getting worse at 8:30 PM. It is located in the left side of her chest, persistent, pleuritic. It is aggravated by deep breath. It is associated with shortness of breath, but not with cough, fever or chills. He does not have pain over calf areas. No recent long distant traveling. He also has mild nausea, but no abdominal pain. Reports that she has a chronic mild diarrhea with loose stools for more than one year. She feels hot all the time, and sweats a lot always. No previous diagnosis of thyroid dysfunction. Of note, patient carries diagnosis of alcohol abuse, but he denies drinking alcohol today.   Currently patient denies fever, chills, running nose, ear pain, headaches, cough, abdominal pain, diarrhea, constipation, dysuria, urgency, frequency, hematuria, skin rashes, joint pain or leg swelling. No unilateral weakness, numbness or tingling sensations. No vision change or  hearing loss.  In ED, patient was found to have WBC 9.9, negative troponin, BNP 22, temperature normal, tachycardia, oxygen saturation to 89% on room air. Electrolytes and renal function okay. CAT showed no pulmonary embolism, could not get evaluation of pulmonary arteries beyond the distal segmental level at the bases due to motion effect, possible arspace disease in the lingula with small pleural effusion and mild mediastinal and hilar lymphadenopathy. CXR showed bibasilar heterogeneous opacities, left greater than right, atelectasis versus infiltrate? Patient is admitted to inpatient for further evaluation and treatment.  Hospital Course:  1-Chest pain;pleuritic.  CT angio, negative for PE, but unable to rule out PE distal pulmonary artery.  ECHO normal Ef  Doppler LE negative Troponin times one negative.  Hypercoagulable panel in process.  Discussed with Dr Kendrick Fries. Order D dimer, if negative, no need to treat for PE. D dimer order negative.  Incentive spirometry.  Will treat for PNA with Levaquin.  Chest pain better  2-Acute Hypoxic Respiratory Failure;  Differential pna vs Questionable PE.  Continue with Lovenox for now.  CT angio inconclusive.  Doppler LE negative.  Discussed with Dr Kendrick Fries. Order D dimer, if negative, no need to treat for PE. D dimer negative. Will terat for PNA.   3-Mediastinal and hilar lymphadenopathy;  Needs outpatient follow up. Patient aware.   HTN; started on Norvasc.   Depression and anxiety: Patient is a anxious on admission, which is likely due to alcohol abuse, though patient denies drinking alcohol. -Continue Trazodone, Lexapro  Mild chronic diarrhea: Given sweating a lot and feeling hot all the  time, will rule out thyroid dysfunction -TSH 1.1 and free T4 0.78. Normal.   Alcohol abuse: -CIWA protocol  Procedures:  Doppler LE negative for DVT  ECHO; normal Ef  Consultations:  Pulmonary by phone  Discharge Exam: Filed  Vitals:   05/21/15 0731  BP: 138/84  Pulse: 85  Temp: 98.6 F (37 C)  Resp: 18    General: Alert in no distress.  Cardiovascular: S 1, S 2 RRR Respiratory: CTA  Discharge Instructions   Discharge Instructions    Diet - low sodium heart healthy    Complete by:  As directed      Increase activity slowly    Complete by:  As directed           Current Discharge Medication List    START taking these medications   Details  amLODipine (NORVASC) 5 MG tablet Take 1 tablet (5 mg total) by mouth daily. Qty: 30 tablet, Refills: 0    levofloxacin (LEVAQUIN) 750 MG tablet Take 1 tablet (750 mg total) by mouth daily. Qty: 5 tablet, Refills: 0    oxyCODONE-acetaminophen (PERCOCET/ROXICET) 5-325 MG per tablet Take 2 tablets by mouth every 6 (six) hours as needed for severe pain. Qty: 30 tablet, Refills: 0      CONTINUE these medications which have CHANGED   Details  escitalopram (LEXAPRO) 20 MG tablet Take 1 tablet (20 mg total) by mouth daily. For depression. Qty: 30 tablet, Refills: 0      CONTINUE these medications which have NOT CHANGED   Details  traZODone (DESYREL) 50 MG tablet Take 50 mg by mouth at bedtime as needed for sleep.       No Known Allergies Follow-up Information    Follow up with REESE,BETTI D, MD In 1 week.   Specialty:  Family Medicine   Contact information:   [address] [place] [address] [phone]        The results of significant diagnostics from this hospitalization (including imaging, microbiology, ancillary and laboratory) are listed below for reference.    Significant Diagnostic Studies: Ct Angio Chest Pe W/cm &/or Wo Cm  05/20/2015   CLINICAL DATA:  Chest pain starting at 2 p.m.  EXAM: CT ANGIOGRAPHY CHEST WITH CONTRAST  TECHNIQUE: Multidetector CT imaging of the chest was performed using the standard protocol during bolus administration of intravenous contrast. Multiplanar CT image reconstructions and MIPs were  obtained to evaluate the vascular anatomy.  CONTRAST:  80mL OMNIPAQUE IOHEXOL 350 MG/ML SOLN  COMPARISON:  None.  FINDINGS: THORACIC INLET/BODY WALL:  No acute abnormality.  MEDIASTINUM:  Left ventricular enlargement. No pericardial effusion. Negative aorta.  Significantly limited evaluation of the distal segmental pulmonary arteries due to motion at the bases.  Mildly enlarged mediastinal lymph nodes, most notable in the hilar and subcarinal stations. Hilar nodes measure up to 14 mm short axis.  LUNG WINDOWS:  Subpleural airspace disease in the lingula. There is a small reactive left pleural effusion. Mild atelectatic appearing opacity present in the left posterior costophrenic sulcus.  UPPER ABDOMEN:  Prominent lymph node near the liver caudate measuring 12 mm short axis.  OSSEOUS:  No acute fracture. No suspicious lytic or blastic lesions. Remote posterior right fourth rib fracture.  Review of the MIP images confirms the above findings.  IMPRESSION: 1. No pulmonary embolism is identified, but motion precludes evaluation of pulmonary arteries beyond the distal segmental level at the bases. 2. Airspace disease in the lingula with small pleural effusion, potentially pneumonia.  Note that a lung infarct would have the same appearance and unfortunately the associated pulmonary arteries are completely obscured by patient motion. If acute pulmonary embolism is still of clinical concern, suggest lower extremity Dopplers. 3. Left ventricular enlargement. 4. Mild mediastinal and hilar lymphadenopathy. Sarcoid could have this appearance, recommend outpatient workup.   Electronically Signed   By: Marnee SpringJonathon  Watts M.D.   On: 05/20/2015 00:14   Dg Chest Port 1 View  05/19/2015   CLINICAL DATA:  Chest pain.  Shortness of breath.  EXAM: PORTABLE CHEST - 1 VIEW  COMPARISON:  None.  FINDINGS: Borderline enlarged cardiac silhouette and mediastinal contours, potentially accentuated due to AP projection and decreased lung volumes.  Bibasilar heterogeneous opacities left greater right. Pulmonary congestion without frank evidence of edema. No definite pleural effusion or pneumothorax. Old fracture involving the posterior lateral aspect of the right fourth rib.  IMPRESSION: Bibasilar heterogeneous opacities, left greater than right, atelectasis versus infiltrate. Further evaluation with a PA and lateral chest radiograph may be obtained as clinically indicated.   Electronically Signed   By: Simonne ComeJohn  Watts M.D.   On: 05/19/2015 22:15    Microbiology: Recent Results (from the past 240 hour(s))  MRSA PCR Screening     Status: None   Collection Time: 05/20/15  2:49 AM  Result Value Ref Range Status   MRSA by PCR NEGATIVE NEGATIVE Final    Comment:        The GeneXpert MRSA Assay (FDA approved for NASAL specimens only), is one component of a comprehensive MRSA colonization surveillance program. It is not intended to diagnose MRSA infection nor to guide or monitor treatment for MRSA infections.      Labs: Basic Metabolic Panel:  Recent Labs Lab 05/19/15 2208 05/20/15 0435 05/21/15 0545  NA 136 136 136  K 3.8 4.1 3.7  CL 102 105 104  CO2 20* 22 26  GLUCOSE 127* 116* 98  BUN 11 10 9   CREATININE 1.02 0.71 0.75  CALCIUM 8.8* 8.3* 8.4*  MG  --   --  1.8   Liver Function Tests:  Recent Labs Lab 05/20/15 0435  AST 70*  ALT 124*  ALKPHOS 65  BILITOT 1.1  PROT 6.6  ALBUMIN 3.5   No results for input(s): LIPASE, AMYLASE in the last 168 hours. No results for input(s): AMMONIA in the last 168 hours. CBC:  Recent Labs Lab 05/19/15 2208 05/20/15 0435 05/21/15 0545  WBC 9.9 7.0 3.8*  HGB 17.2* 15.7 15.0  HCT 48.8 43.8 42.2  MCV 91.6 93.4 92.3  PLT 154 102* 94*   Cardiac Enzymes:  Recent Labs Lab 05/20/15 0428 05/20/15 0824 05/20/15 1405  TROPONINI <0.03 <0.03 <0.03   BNP: BNP (last 3 results)  Recent Labs  05/19/15 2208  BNP 22.0    ProBNP (last 3 results) No results for input(s):  PROBNP in the last 8760 hours.  CBG:  Recent Labs Lab 05/20/15 0757 05/20/15 1550 05/20/15 2002 05/21/15 0730  GLUCAP 106* 90 106* 94       Signed:  Breyon Blass A  [facility] 05/21/2015, 8:48 AM

## 2015-05-21 NOTE — Progress Notes (Signed)
NP Claiborne Billingsallahan [facility]. Pt is asymptomatic, denies weakness, SOB, fatigue, or feeling of rapid heartbeat. Will continue to monitor closely.

## 2015-05-22 LAB — BETA-2-GLYCOPROTEIN I ABS, IGG/M/A: Beta-2-Glycoprotein I IgM: <9 GPI IgM units (ref 0–32)

## 2015-05-22 LAB — CARDIOLIPIN ANTIBODIES, IGG, IGM, IGA
Anticardiolipin IgG: <9 GPL U/mL (ref 0–14)
Anticardiolipin IgM: <9 MPL U/mL (ref 0–12)

## 2015-05-22 LAB — HOMOCYSTEINE: Homocysteine: 7.4 umol/L (ref 0.0–15.0)

## 2015-05-22 LAB — HIV ANTIBODY (ROUTINE TESTING W REFLEX): HIV Screen 4th Generation wRfx: NONREACTIVE

## 2015-05-23 LAB — LEGIONELLA ANTIGEN, URINE

## 2015-05-24 LAB — PROTHROMBIN GENE MUTATION

## 2015-05-27 LAB — FACTOR 5 LEIDEN

## 2015-07-07 ENCOUNTER — Encounter: Payer: Self-pay | Admitting: Internal Medicine

## 2015-07-07 ENCOUNTER — Ambulatory Visit (INDEPENDENT_AMBULATORY_CARE_PROVIDER_SITE_OTHER): Payer: 59 | Admitting: Internal Medicine

## 2015-07-07 VITALS — BP 160/120 | HR 76 | Temp 98.2°F | Ht 73.5 in | Wt 295.0 lb

## 2015-07-07 DIAGNOSIS — F419 Anxiety disorder, unspecified: Secondary | ICD-10-CM

## 2015-07-07 DIAGNOSIS — G4700 Insomnia, unspecified: Secondary | ICD-10-CM | POA: Diagnosis not present

## 2015-07-07 DIAGNOSIS — I10 Essential (primary) hypertension: Secondary | ICD-10-CM

## 2015-07-07 DIAGNOSIS — F418 Other specified anxiety disorders: Secondary | ICD-10-CM | POA: Diagnosis not present

## 2015-07-07 DIAGNOSIS — F329 Major depressive disorder, single episode, unspecified: Secondary | ICD-10-CM

## 2015-07-07 DIAGNOSIS — F1010 Alcohol abuse, uncomplicated: Secondary | ICD-10-CM | POA: Diagnosis not present

## 2015-07-07 DIAGNOSIS — F1011 Alcohol abuse, in remission: Secondary | ICD-10-CM

## 2015-07-07 MED ORDER — TRAZODONE HCL 50 MG PO TABS: 50.0000 mg | ORAL_TABLET | Freq: Every evening | ORAL | Status: DC | PRN

## 2015-07-07 MED ORDER — AMLODIPINE BESYLATE 5 MG PO TABS: 5.0000 mg | ORAL_TABLET | Freq: Every day | ORAL | Status: DC

## 2015-07-07 MED ORDER — ESCITALOPRAM OXALATE 20 MG PO TABS: 20.0000 mg | ORAL_TABLET | Freq: Every day | ORAL | Status: DC

## 2015-07-07 NOTE — Assessment & Plan Note (Signed)
BP uncontrolled CBC and CMET reviewed from hospital Will restart Norvasc 5 mg, eRx sent ECG from 04/2015 reviewed  RTC in 1 month to recheck BP

## 2015-07-07 NOTE — Progress Notes (Signed)
Pre visit review using our clinic review tool, if applicable. No additional management support is needed unless otherwise documented below in the visit note. 

## 2015-07-07 NOTE — Progress Notes (Signed)
HPI  Pt presents to the clinic today to establish care. He is transferring care from [facility].  Flu: 09/2014 Tetanus: < 10 years ago Vision: yearly, he has cataracts Dentist: biannually  HTN:  He was on BP medication in the past but does not remember the name. He was able to come off his medications and controlled his BP with diet and weight loss. He was started on Norvasc 5 mg daily while he was in the ER 04/2015. He has been out of his medication 2-3 weeks. He did not monitor his blood pressure while on the blood pressure medication. He has not noticed any swelling in his legs while on the medication. His BP today is 160/120. He denies chest pain, chest tightness, dizziness or shortness of breath.  Anxiety and Depression: Depression is in remission. He has had a lot of stress at work which is the main trigger for his anxiety. He takes the Lexapro daily and does not reports any adverse effects. He denies SI/HI.  Insomnia: He has trouble falling asleep and staying asleep. He takes Trazadone 3 days per week.   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 Going to [employer] meetings 3 x week.  Past Medical History  Diagnosis Date  . Hypertension   . Alcohol abuse   . Depression     Current Outpatient Prescriptions  Medication Sig Dispense Refill  . amLODipine (NORVASC) 5 MG tablet Take 1 tablet (5 mg total) by mouth daily. 30 tablet 0  . escitalopram (LEXAPRO) 20 MG tablet Take 1 tablet (20 mg total) by mouth daily. For depression. 30 tablet 0  . traZODone (DESYREL) 50 MG tablet Take 50 mg by mouth at bedtime as needed for sleep.     No current facility-administered medications for this visit.    No Known Allergies  Family History  Problem Relation Age of Onset  . Anxiety disorder Brother     History   Social History  . Marital Status: Single    Spouse Name: N/A  . Number of Children: N/A  . Years of Education: N/A   Occupational History  . Not on file.   Social  History Main Topics  . Smoking status: Former Smoker    Types: Cigarettes  . Smokeless tobacco: Not on file     Comment: quit 2013  . Alcohol Use: No  . Drug Use: No  . Sexual Activity: Not on file   Other Topics Concern  . Not on file   Social History Narrative    ROS:  Constitutional: Denies fever, malaise, fatigue, headache or abrupt weight changes.  Respiratory: Denies difficulty breathing, shortness of breath, cough or sputum production.   Cardiovascular: Denies chest pain, chest tightness, palpitations or swelling in the hands or feet.  Neurological: Pt reports insomnia. Denies dizziness, difficulty with memory, difficulty with speech or problems with balance and coordination.  Psych: Pt reports anxiety. Denies depression, SI/HI.  No other specific complaints in a complete review of systems (except as listed in HPI above).  PE:  BP 160/120 mmHg  Pulse 76  Temp(Src) 98.2 F (36.8 C) (Oral)  Ht 6' 1.5" (1.867 m)  Wt 295 lb (133.811 kg)  BMI 38.39 kg/m2  SpO2 98% Wt Readings from Last 3 Encounters:  07/07/15 295 lb (133.811 kg)  05/21/15 294 lb 4.8 oz (133.494 kg)  07/10/12 288 lb (130.636 kg)    General: Appears his stated age, obese in NAD. HEENT: Head: normal shape and size; Eyes: sclera  white, no icterus, conjunctiva pink, PERRLA and EOMs intact;  Cardiovascular: Normal rate and rhythm. S1,S2 noted.  No murmur, rubs or gallops noted.  Pulmonary/Chest: Normal effort and positive vesicular breath sounds. No respiratory distress. No wheezes, rales or ronchi noted.  Neurological: Alert and oriented.  Psychiatric: Mood and affect normal. Behavior is normal. Judgment and thought content normal.    BMET    Component Value Date/Time   NA 136 05/21/2015 0545   K 3.7 05/21/2015 0545   CL 104 05/21/2015 0545   CO2 26 05/21/2015 0545   GLUCOSE 98 05/21/2015 0545   BUN 9 05/21/2015 0545   CREATININE 0.75 05/21/2015 0545   CALCIUM 8.4* 05/21/2015 0545   GFRNONAA  >60 05/21/2015 0545   GFRAA >60 05/21/2015 0545    Lipid Panel     Component Value Date/Time   CHOL 184 07/11/2012 0615   TRIG 93 07/11/2012 0615   HDL 54 07/11/2012 0615   CHOLHDL 3.4 07/11/2012 0615   VLDL 19 07/11/2012 0615   LDLCALC 111* 07/11/2012 0615    CBC    Component Value Date/Time   WBC 3.8* 05/21/2015 0545   RBC 4.57 05/21/2015 0545   HGB 15.0 05/21/2015 0545   HCT 42.2 05/21/2015 0545   PLT 94* 05/21/2015 0545   MCV 92.3 05/21/2015 0545   MCH 32.8 05/21/2015 0545   MCHC 35.5 05/21/2015 0545   RDW 12.6 05/21/2015 0545    Hgb A1C Lab Results  Component Value Date   HGBA1C 5.6 07/11/2012     Assessment and Plan:

## 2015-07-07 NOTE — Assessment & Plan Note (Addendum)
Depression in remission Still has issues with anxiety d/t stressful work situation but controlled with Lexapro Recent CBC and CMET reviewed Support offered today eRx for Lexapro sent to pharmacy

## 2015-07-07 NOTE — Assessment & Plan Note (Signed)
Continue Trazadone prn eRx sent to pharmacy Recent CBC and CMET reviewed

## 2015-07-07 NOTE — Patient Instructions (Signed)

## 2015-07-07 NOTE — Assessment & Plan Note (Signed)
Congratulated him on his sobriety Encouraged him to continue going to [employer] meetings

## 2015-08-09 ENCOUNTER — Ambulatory Visit: Payer: 59 | Admitting: Internal Medicine

## 2015-08-09 DIAGNOSIS — Z0289 Encounter for other administrative examinations: Secondary | ICD-10-CM

## 2015-09-08 ENCOUNTER — Other Ambulatory Visit: Payer: Self-pay | Admitting: Internal Medicine

## 2015-09-16 ENCOUNTER — Telehealth: Payer: Self-pay | Admitting: Internal Medicine

## 2015-09-16 NOTE — Telephone Encounter (Signed)
Med recs sents from [facility]. No immunization records.

## 2017-06-12 ENCOUNTER — Ambulatory Visit (INDEPENDENT_AMBULATORY_CARE_PROVIDER_SITE_OTHER): Payer: Self-pay | Admitting: Primary Care

## 2017-06-12 ENCOUNTER — Encounter: Payer: Self-pay | Admitting: Primary Care

## 2017-06-12 VITALS — BP 154/106 | HR 106 | Temp 98.7°F | Wt 312.0 lb

## 2017-06-12 DIAGNOSIS — I10 Essential (primary) hypertension: Secondary | ICD-10-CM

## 2017-06-12 MED ORDER — LISINOPRIL 20 MG PO TABS: 20.0000 mg | ORAL_TABLET | Freq: Every day | ORAL | 0 refills | Status: DC

## 2017-06-12 NOTE — Assessment & Plan Note (Signed)
Two documented elevated BP readings this week. Given history of hypertension coupled with recent elevated BP readings, will treat.  Rx for Lisinopril 20 mg sent to [facility] as this is on the $4 list. Will have him start monitoring BP at home and bring readings with him to his next visit. Follow up in 2 weeks with PCP for re-evaluation and BMP. Information provided regarding a DASH diet.

## 2017-06-12 NOTE — Patient Instructions (Signed)
Start lisinopril 20 mg tablets for high blood pressure. Take 1 tablet by mouth once daily.  Start monitoring your blood pressure if possible. Check your blood pressure daily, around the same time of day, for the next 2 weeks.  Ensure that you have rested for 30 minutes prior to checking your blood pressure. Record your readings and bring them to your next visit.  It's important to improve your diet by reducing consumption of fast food, fried food, processed snack foods, sugary drinks. Increase consumption of fresh vegetables and fruits, whole grains, water.  Ensure you are drinking 64 ounces of water daily.  Schedule a follow up visit with Nicki Reaper in 2 weeks.  It was a pleasure meeting you!   DASH Eating Plan DASH stands for "Dietary Approaches to Stop Hypertension." The DASH eating plan is a healthy eating plan that has been shown to reduce high blood pressure (hypertension). It may also reduce your risk for type 2 diabetes, heart disease, and stroke. The DASH eating plan may also help with weight loss. What are tips for following this plan? General guidelines  Avoid eating more than 2,300 mg (milligrams) of salt (sodium) a day. If you have hypertension, you may need to reduce your sodium intake to 1,500 mg a day.  Limit alcohol intake to no more than 1 drink a day for nonpregnant women and 2 drinks a day for men. One drink equals 12 oz of beer, 5 oz of wine, or 1 oz of hard liquor.  Work with your health care provider to maintain a healthy body weight or to lose weight. Ask what an ideal weight is for you.  Get at least 30 minutes of exercise that causes your heart to beat faster (aerobic exercise) most days of the week. Activities may include walking, swimming, or biking.  Work with your health care provider or diet and nutrition specialist (dietitian) to adjust your eating plan to your individual calorie needs. Reading food labels  Check food labels for the amount of sodium per  serving. Choose foods with less than 5 percent of the Daily Value of sodium. Generally, foods with less than 300 mg of sodium per serving fit into this eating plan.  To find whole grains, look for the word "whole" as the first word in the ingredient list. Shopping  Buy products labeled as "low-sodium" or "no salt added."  Buy fresh foods. Avoid canned foods and premade or frozen meals. Cooking  Avoid adding salt when cooking. Use salt-free seasonings or herbs instead of table salt or sea salt. Check with your health care provider or pharmacist before using salt substitutes.  Do not fry foods. Cook foods using healthy methods such as baking, boiling, grilling, and broiling instead.  Cook with heart-healthy oils, such as olive, canola, soybean, or sunflower oil. Meal planning   Eat a balanced diet that includes: ? 5 or more servings of fruits and vegetables each day. At each meal, try to fill half of your plate with fruits and vegetables. ? Up to 6-8 servings of whole grains each day. ? Less than 6 oz of lean meat, poultry, or fish each day. A 3-oz serving of meat is about the same size as a deck of cards. One egg equals 1 oz. ? 2 servings of low-fat dairy each day. ? A serving of nuts, seeds, or beans 5 times each week. ? Heart-healthy fats. Healthy fats called Omega-3 fatty acids are found in foods such as flaxseeds and coldwater fish, like  sardines, salmon, and mackerel.  Limit how much you eat of the following: ? Canned or prepackaged foods. ? Food that is high in trans fat, such as fried foods. ? Food that is high in saturated fat, such as fatty meat. ? Sweets, desserts, sugary drinks, and other foods with added sugar. ? Full-fat dairy products.  Do not salt foods before eating.  Try to eat at least 2 vegetarian meals each week.  Eat more home-cooked food and less restaurant, buffet, and fast food.  When eating at a restaurant, ask that your food be prepared with less salt  or no salt, if possible. What foods are recommended? The items listed may not be a complete list. Talk with your dietitian about what dietary choices are best for you. Grains Whole-grain or whole-wheat bread. Whole-grain or whole-wheat pasta. Brown rice. Orpah Cobbatmeal. Quinoa. Bulgur. Whole-grain and low-sodium cereals. Pita bread. Low-fat, low-sodium crackers. Whole-wheat flour tortillas. Vegetables Fresh or frozen vegetables (raw, steamed, roasted, or grilled). Low-sodium or reduced-sodium tomato and vegetable juice. Low-sodium or reduced-sodium tomato sauce and tomato paste. Low-sodium or reduced-sodium canned vegetables. Fruits All fresh, dried, or frozen fruit. Canned fruit in natural juice (without added sugar). Meat and other protein foods Skinless chicken or Malawiturkey. Ground chicken or Mal[place]iturkey. Pork with fat trimmed off. Fish and seafood. Egg whites. Dried beans, peas, or lentils. Unsalted nuts, nut butters, and seeds. Unsalted canned beans. Lean cuts of beef with fat trimmed off. Low-sodium, lean deli meat. Dairy Low-fat (1%) or fat-free (skim) milk. Fat-free, low-fat, or reduced-fat cheeses. Nonfat, low-sodium ricotta or cottage cheese. Low-fat or nonfat yogurt. Low-fat, low-sodium cheese. Fats and oils Soft margarine without trans fats. Vegetable oil. Low-fat, reduced-fat, or light mayonnaise and salad dressings (reduced-sodium). Canola, safflower, olive, soybean, and sunflower oils. Avocado. Seasoning and other foods Herbs. Spices. Seasoning mixes without salt. Unsalted popcorn and pretzels. Fat-free sweets. What foods are not recommended? The items listed may not be a complete list. Talk with your dietitian about what dietary choices are best for you. Grains Baked goods made with fat, such as croissants, muffins, or some breads. Dry pasta or rice meal packs. Vegetables Creamed or fried vegetables. Vegetables in a cheese sauce. Regular canned vegetables (not low-sodium or reduced-sodium).  Regular canned tomato sauce and paste (not low-sodium or reduced-sodium). Regular tomato and vegetable juice (not low-sodium or reduced-sodium). Rosita FirePickles. Olives. Fruits Canned fruit in a light or heavy syrup. Fried fruit. Fruit in cream or butter sauce. Meat and other protein foods Fatty cuts of meat. Ribs. Fried meat. Tomasa BlaseBacon. Sausage. Bologna and other processed lunch meats. Salami. Fatback. Hotdogs. Bratwurst. Salted nuts and seeds. Canned beans with added salt. Canned or smoked fish. Whole eggs or egg yolks. Chicken or Malawiturkey with skin. Dairy Whole or 2% milk, cream, and half-and-half. Whole or full-fat cream cheese. Whole-fat or sweetened yogurt. Full-fat cheese. Nondairy creamers. Whipped toppings. Processed cheese and cheese spreads. Fats and oils Butter. Stick margarine. Lard. Shortening. Ghee. Bacon fat. Tropical oils, such as coconut, palm kernel, or palm oil. Seasoning and other foods Salted popcorn and pretzels. Onion salt, garlic salt, seasoned salt, table salt, and sea salt. Worcestershire sauce. Tartar sauce. Barbecue sauce. Teriyaki sauce. Soy sauce, including reduced-sodium. Steak sauce. Canned and packaged gravies. Fish sauce. Oyster sauce. Cocktail sauce. Horseradish that you find on the shelf. Ketchup. Mustard. Meat flavorings and tenderizers. Bouillon cubes. Hot sauce and Tabasco sauce. Premade or packaged marinades. Premade or packaged taco seasonings. Relishes. Regular salad dressings. Where to find more information:  National Heart, Lung, and Blood Institute: PopSteam.iswww.nhlbi.nih.gov  American Heart Association: www.heart.org Summary  The DASH eating plan is a healthy eating plan that has been shown to reduce high blood pressure (hypertension). It may also reduce your risk for type 2 diabetes, heart disease, and stroke.  With the DASH eating plan, you should limit salt (sodium) intake to 2,300 mg a day. If you have hypertension, you may need to reduce your sodium intake to 1,500 mg  a day.  When on the DASH eating plan, aim to eat more fresh fruits and vegetables, whole grains, lean proteins, low-fat dairy, and heart-healthy fats.  Work with your health care provider or diet and nutrition specialist (dietitian) to adjust your eating plan to your individual calorie needs. This information is not intended to replace advice given to you by your health care provider. Make sure you discuss any questions you have with your health care provider. Document Released: 11/29/2011 Document Revised: 12/03/2016 Document Reviewed: 12/03/2016 Elsevier Interactive Patient Education  2017 [employer]

## 2017-06-12 NOTE — Progress Notes (Signed)
   Subjective:    Patient ID: Tona Sensingric Santana, male    DOB: 05-Apr-1971, 46 y.o.   MRN: [id number]  HPI  Mr. Benn MoulderBruesch is a 46 year old male with a history of hypertension who presents today with a chief complaint of hypertension. He was evaluated at [facility] 2 days ago for a Hepatitis vaccination for work. They checked his BP which was 179/139. They recommended he go to the emergency department but he refused as he is without insurance.  His BP in the office today is 154/106. He has noticed visual changes over the last 6 months with one episode of double vision. He denies headaches, chest pain, lower extremity swelling, shortness of breath. He was previously managed on Amlodipine but has not taken in 3-4 years as he was able to control his BP through life style changes.  He's not checked his BP within the last year.   Review of Systems  Constitutional: Negative for fatigue.  Respiratory: Negative for shortness of breath.   Cardiovascular: Negative for chest pain.  Neurological: Negative for dizziness and headaches.       Past Medical History:  Diagnosis Date  . Alcohol abuse   . Depression   . Hypertension      Social History   Social History  . Marital status: Single    Spouse name: N/A  . Number of children: N/A  . Years of education: N/A   Occupational History  . Not on file.   Social History Main Topics  . Smoking status: Former Smoker    Types: Cigarettes  . Smokeless tobacco: Never Used     Comment: quit 2013  . Alcohol use No  . Drug use: No  . Sexual activity: Yes   Other Topics Concern  . Not on file   Social History Narrative  . No narrative on file    No past surgical history on file.  Family History  Problem Relation Age of Onset  . Anxiety disorder Brother   . Cancer Neg Hx   . Diabetes Neg Hx   . Stroke Neg Hx   . Heart disease Neg Hx     No Known Allergies  No current outpatient prescriptions on file prior to visit.   No  current facility-administered medications on file prior to visit.     BP (!) 154/106 (BP Location: Left Arm, Patient Position: Sitting, Cuff Size: Large)   Pulse (!) 106   Temp 98.7 F (37.1 C) (Oral)   Wt (!) 312 lb (141.5 kg)   SpO2 97%   BMI 40.61 kg/m    Objective:   Physical Exam  Constitutional: He appears well-nourished.  Large body habitus  Neck: Neck supple.  Cardiovascular: Normal rate and regular rhythm.   No lower extremity edema  Pulmonary/Chest: Effort normal and breath sounds normal.  Skin: Skin is warm and dry.          Assessment & Plan:

## 2017-07-01 ENCOUNTER — Ambulatory Visit (INDEPENDENT_AMBULATORY_CARE_PROVIDER_SITE_OTHER): Payer: Self-pay | Admitting: Internal Medicine

## 2017-07-01 ENCOUNTER — Encounter: Payer: Self-pay | Admitting: Internal Medicine

## 2017-07-01 DIAGNOSIS — I10 Essential (primary) hypertension: Secondary | ICD-10-CM

## 2017-07-01 MED ORDER — LISINOPRIL 20 MG PO TABS: 20.0000 mg | ORAL_TABLET | Freq: Every day | ORAL | 1 refills | Status: DC

## 2017-07-01 NOTE — Progress Notes (Signed)
Subjective:    Patient ID: Nicholas Price, male    DOB: 1971/11/24, 46 y.o.   MRN: [id number]  HPI  Pt presents to the clinic today for 2 week follow up of HTN. At his last visit, he was started on Lisinopril 20 mg daily. He has been taking the medication as prescribed. He denies adverse side effects. His BP today is 124/76. He has lost 8+ lbs. ECG from 04/2015 reviewed.  Review of Systems      Past Medical History:  Diagnosis Date  . Alcohol abuse   . Depression   . Hypertension     Current Outpatient Prescriptions  Medication Sig Dispense Refill  . lisinopril (PRINIVIL,ZESTRIL) 20 MG tablet Take 1 tablet (20 mg total) by mouth daily. 30 tablet 0   No current facility-administered medications for this visit.     No Known Allergies  Family History  Problem Relation Age of Onset  . Anxiety disorder Brother   . Cancer Neg Hx   . Diabetes Neg Hx   . Stroke Neg Hx   . Heart disease Neg Hx     Social History   Social History  . Marital status: Single    Spouse name: N/A  . Number of children: N/A  . Years of education: N/A   Occupational History  . Not on file.   Social History Main Topics  . Smoking status: Former Smoker    Types: Cigarettes  . Smokeless tobacco: Never Used     Comment: quit 2013  . Alcohol use No  . Drug use: No  . Sexual activity: Yes   Other Topics Concern  . Not on file   Social History Narrative  . No narrative on file     Constitutional: Denies fever, malaise, fatigue, headache or abrupt weight changes.  Respiratory: Denies difficulty breathing, shortness of breath, cough or sputum production.   Cardiovascular: Denies chest pain, chest tightness, palpitations or swelling in the hands or feet.  Gastrointestinal: Denies abdominal pain, bloating, constipation, diarrhea or blood in the stool.  Neurological: Denies dizziness, difficulty with memory, difficulty with speech or problems with balance and coordination.    No other  specific complaints in a complete review of systems (except as listed in HPI above).  Objective:   Physical Exam   BP 124/76   Pulse 98   Temp 98.3 F (36.8 C) (Oral)   Wt (!) 303 lb 8 oz (137.7 kg)   BMI 39.50 kg/m  Wt Readings from Last 3 Encounters:  07/01/17 (!) 303 lb 8 oz (137.7 kg)  06/12/17 (!) 312 lb (141.5 kg)  07/07/15 295 lb (133.8 kg)    General: Appears his stated age, obese in NAD. Cardiovascular: Normal rate and rhythm. S1,S2 noted.  No murmur, rubs or gallops noted.  Pulmonary/Chest: Normal effort and positive vesicular breath sounds. No respiratory distress. No wheezes, rales or ronchi noted.  Neurological: Alert and oriented.    BMET    Component Value Date/Time   NA 136 05/21/2015 0545   K 3.7 05/21/2015 0545   CL 104 05/21/2015 0545   CO2 26 05/21/2015 0545   GLUCOSE 98 05/21/2015 0545   BUN 9 05/21/2015 0545   CREATININE 0.75 05/21/2015 0545   CALCIUM 8.4 (L) 05/21/2015 0545   GFRNONAA >60 05/21/2015 0545   GFRAA >60 05/21/2015 0545    Lipid Panel     Component Value Date/Time   CHOL 184 07/11/2012 0615   TRIG 93 07/11/2012 0615   HDL  54 07/11/2012 0615   CHOLHDL 3.4 07/11/2012 0615   VLDL 19 07/11/2012 0615   LDLCALC 111 (H) 07/11/2012 0615    CBC    Component Value Date/Time   WBC 3.8 (L) 05/21/2015 0545   RBC 4.57 05/21/2015 0545   HGB 15.0 05/21/2015 0545   HCT 42.2 05/21/2015 0545   PLT 94 (L) 05/21/2015 0545   MCV 92.3 05/21/2015 0545   MCH 32.8 05/21/2015 0545   MCHC 35.5 05/21/2015 0545   RDW 12.6 05/21/2015 0545    Hgb A1C Lab Results  Component Value Date   HGBA1C 5.6 07/11/2012        Assessment & Plan:

## 2017-07-01 NOTE — Patient Instructions (Signed)

## 2017-07-01 NOTE — Assessment & Plan Note (Signed)
At goal on Lisinopril Medication refilled today  Make an appt in 6 month for your annual exam

## 2017-09-03 ENCOUNTER — Ambulatory Visit (INDEPENDENT_AMBULATORY_CARE_PROVIDER_SITE_OTHER): Payer: BLUE CROSS/BLUE SHIELD | Admitting: Internal Medicine

## 2017-09-03 DIAGNOSIS — F32A Depression, unspecified: Secondary | ICD-10-CM

## 2017-09-03 DIAGNOSIS — F419 Anxiety disorder, unspecified: Secondary | ICD-10-CM | POA: Diagnosis not present

## 2017-09-03 DIAGNOSIS — I10 Essential (primary) hypertension: Secondary | ICD-10-CM

## 2017-09-03 DIAGNOSIS — F5104 Psychophysiologic insomnia: Secondary | ICD-10-CM | POA: Diagnosis not present

## 2017-09-03 DIAGNOSIS — F329 Major depressive disorder, single episode, unspecified: Secondary | ICD-10-CM | POA: Diagnosis not present

## 2017-09-03 MED ORDER — ESCITALOPRAM OXALATE 20 MG PO TABS: 20.0000 mg | ORAL_TABLET | Freq: Every day | ORAL | 2 refills | Status: DC

## 2017-09-03 MED ORDER — TRAZODONE HCL 50 MG PO TABS
25.0000 mg | ORAL_TABLET | Freq: Every evening | ORAL | 2 refills | Status: DC | PRN
Start: 2017-09-03 — End: 2018-12-02

## 2017-09-03 NOTE — Progress Notes (Signed)
Subjective:    Patient ID: Nicholas Price, male    DOB: Aug 03, 1971, 46 y.o.   MRN: [id number]  HPI  Pt presents to the clinic today with concerns for anxiety and depression. He reports in the last 2-3 months, he started a new job. This job is very stressful. It is causing him a lot of anxiety. He feels very overwhelmed and distressed. He feels like if he doesn't get help, something bad may happen. He has noticed that he is eating more to help him cope. He also took a alcoholic drink about 1 week after he has been sober for 3 years. He is having trouble sleeping. He can fall asleep but he can not stay asleep. He denies SI/HI. He reports he has struggled with anxiety, depression and insomnia in the past. He was treated with Lexapro and Trazadone which seemed to work really well for him. He would like to get restarted on these meds today.  Of note, his BP today is 140/98. He is currently taking Lisinopril 20 mg daily. He reports he has been compliant with his medications. He feels like his BP is more elevated due to his level of stress. He denies headache, dizziness, visual changes, chest pain or shortness of breath.  Review of Systems  Past Medical History:  Diagnosis Date  . Alcohol abuse   . Depression   . Hypertension     Current Outpatient Prescriptions  Medication Sig Dispense Refill  . lisinopril (PRINIVIL,ZESTRIL) 20 MG tablet Take 1 tablet (20 mg total) by mouth daily. 90 tablet 1   No current facility-administered medications for this visit.     No Known Allergies  Family History  Problem Relation Age of Onset  . Anxiety disorder Brother   . Cancer Neg Hx   . Diabetes Neg Hx   . Stroke Neg Hx   . Heart disease Neg Hx     Social History   Social History  . Marital status: Single    Spouse name: N/A  . Number of children: N/A  . Years of education: N/A   Occupational History  . Not on file.   Social History Main Topics  . Smoking status: Former Smoker    Types:  Cigarettes  . Smokeless tobacco: Never Used     Comment: quit 2013  . Alcohol use No  . Drug use: No  . Sexual activity: Yes   Other Topics Concern  . Not on file   Social History Narrative  . No narrative on file     Constitutional: Denies fever, malaise, fatigue, headache or abrupt weight changes.  Respiratory: Denies difficulty breathing, shortness of breath, cough or sputum production.   Cardiovascular: Denies chest pain, chest tightness, palpitations or swelling in the hands or feet.  Neurological: Denies dizziness, difficulty with memory, difficulty with speech or problems with balance and coordination.  Psych: Pt reports anxiety, depression and insomnia. Denies SI/HI.  No other specific complaints in a complete review of systems (except as listed in HPI above).     Objective:   Physical Exam   BP (!) 140/98   Pulse 84   Temp 98.1 F (36.7 C) (Oral)   Wt 298 lb (135.2 kg)   SpO2 98%   BMI 38.78 kg/m  Wt Readings from Last 3 Encounters:  09/03/17 298 lb (135.2 kg)  07/01/17 (!) 303 lb 8 oz (137.7 kg)  06/12/17 (!) 312 lb (141.5 kg)    General: Appears his stated age, obese in NAD.  Cardiovascular: Normal rate and rhythm. S1,S2 noted.  No murmur, rubs or gallops noted.  Pulmonary/Chest: Normal effort and positive vesicular breath sounds. No respiratory distress. No wheezes, rales or ronchi noted.  Neurological: Alert and oriented.  Psychiatric: He is anxious appearing today. Judgment and thought content normal.     BMET    Component Value Date/Time   NA 136 05/21/2015 0545   K 3.7 05/21/2015 0545   CL 104 05/21/2015 0545   CO2 26 05/21/2015 0545   GLUCOSE 98 05/21/2015 0545   BUN 9 05/21/2015 0545   CREATININE 0.75 05/21/2015 0545   CALCIUM 8.4 (L) 05/21/2015 0545   GFRNONAA >60 05/21/2015 0545   GFRAA >60 05/21/2015 0545    Lipid Panel     Component Value Date/Time   CHOL 184 07/11/2012 0615   TRIG 93 07/11/2012 0615   HDL 54 07/11/2012 0615     CHOLHDL 3.4 07/11/2012 0615   VLDL 19 07/11/2012 0615   LDLCALC 111 (H) 07/11/2012 0615    CBC    Component Value Date/Time   WBC 3.8 (L) 05/21/2015 0545   RBC 4.57 05/21/2015 0545   HGB 15.0 05/21/2015 0545   HCT 42.2 05/21/2015 0545   PLT 94 (L) 05/21/2015 0545   MCV 92.3 05/21/2015 0545   MCH 32.8 05/21/2015 0545   MCHC 35.5 05/21/2015 0545   RDW 12.6 05/21/2015 0545    Hgb A1C Lab Results  Component Value Date   HGBA1C 5.6 07/11/2012           Assessment & Plan:

## 2017-09-03 NOTE — Assessment & Plan Note (Signed)
Uncontrolled likely due to increased stress Continue Lisinopril for now Encouraged DASH diet and exercise for weight loss  RTC in 6 weeks for follow up anxiety, depression and insomnia. If BP still elevated at that time, will increase Lisinopril to 40 mg daily.

## 2017-09-03 NOTE — Patient Instructions (Signed)

## 2017-09-03 NOTE — Assessment & Plan Note (Signed)
Deteriorated Discussed healthy sleep habits eRx for Trazadone 25-50 mg QHS prn

## 2017-09-03 NOTE — Assessment & Plan Note (Signed)
Deteriorated Support offered today eRx for Lexapro 20 mg daily

## 2017-09-17 ENCOUNTER — Encounter: Payer: Self-pay | Admitting: Internal Medicine

## 2017-09-20 ENCOUNTER — Encounter (HOSPITAL_COMMUNITY): Payer: Self-pay | Admitting: *Deleted

## 2017-09-20 ENCOUNTER — Emergency Department (HOSPITAL_COMMUNITY)
Admission: EM | Admit: 2017-09-20 | Discharge: 2017-09-20 | Disposition: A | Discharge status: Home / Self Care | Payer: BLUE CROSS/BLUE SHIELD | Attending: Emergency Medicine | Admitting: Emergency Medicine

## 2017-09-20 DIAGNOSIS — Z87891 Personal history of nicotine dependence: Secondary | ICD-10-CM | POA: Diagnosis not present

## 2017-09-20 DIAGNOSIS — Z79899 Other long term (current) drug therapy: Secondary | ICD-10-CM | POA: Insufficient documentation

## 2017-09-20 DIAGNOSIS — IMO0002 Reserved for concepts with insufficient information to code with codable children: Secondary | ICD-10-CM

## 2017-09-20 DIAGNOSIS — I10 Essential (primary) hypertension: Secondary | ICD-10-CM | POA: Insufficient documentation

## 2017-09-20 DIAGNOSIS — F1020 Alcohol dependence, uncomplicated: Secondary | ICD-10-CM | POA: Insufficient documentation

## 2017-09-20 LAB — COMPREHENSIVE METABOLIC PANEL
ALBUMIN: 4.1 g/dL (ref 3.5–5.0)
ALT: 117 U/L — ABNORMAL HIGH (ref 17–63)
ANION GAP: 12 (ref 5–15)
AST: 105 U/L — ABNORMAL HIGH (ref 15–41)
Alkaline Phosphatase: 76 U/L (ref 38–126)
BUN: 7 mg/dL (ref 6–20)
CO2: 24 mmol/L (ref 22–32)
Calcium: 8.7 mg/dL — ABNORMAL LOW (ref 8.9–10.3)
Chloride: 103 mmol/L (ref 101–111)
Creatinine, Ser: 0.81 mg/dL (ref 0.61–1.24)
GFR calc Af Amer: >60 mL/min (ref >60)
GLUCOSE: 104 mg/dL — AB (ref 65–99)
POTASSIUM: 3.9 mmol/L (ref 3.5–5.1)
Sodium: 139 mmol/L (ref 135–145)
Total Bilirubin: 0.8 mg/dL (ref 0.3–1.2)
Total Protein: 7.8 g/dL (ref 6.5–8.1)

## 2017-09-20 LAB — RAPID URINE DRUG SCREEN, HOSP PERFORMED
AMPHETAMINES: NOT DETECTED
BENZODIAZEPINES: NOT DETECTED
Barbiturates: NOT DETECTED
Cocaine: NOT DETECTED
OPIATES: NOT DETECTED
Tetrahydrocannabinol: NOT DETECTED

## 2017-09-20 LAB — CBC
HCT: 50 % (ref 39.0–52.0)
Hemoglobin: 16.9 g/dL (ref 13.0–17.0)
MCH: 32.4 pg (ref 26.0–34.0)
MCHC: 33.8 g/dL (ref 30.0–36.0)
MCV: 95.8 fL (ref 78.0–100.0)
Platelets: 138 10*3/uL — ABNORMAL LOW (ref 150–400)
RBC: 5.22 MIL/uL (ref 4.22–5.81)
RDW: 13.2 % (ref 11.5–15.5)
WBC: 4.8 10*3/uL (ref 4.0–10.5)

## 2017-09-20 LAB — ETHANOL: Alcohol, Ethyl (B): 265 mg/dL — ABNORMAL HIGH (ref <10)

## 2017-09-20 MED ORDER — CHLORDIAZEPOXIDE HCL 5 MG PO CAPS
25.0000 mg | ORAL_CAPSULE | Freq: Once | ORAL | Status: AC
  Administered 2017-09-20: 25 mg via ORAL
  Filled 2017-09-20: qty 5

## 2017-09-20 MED ORDER — CHLORDIAZEPOXIDE HCL 25 MG PO CAPS: ORAL_CAPSULE | ORAL | 0 refills | Status: DC

## 2017-09-20 NOTE — Discharge Planning (Signed)
[facility] consulted regarding resources for alcoholism.  EDCM contacted EDSW to provide these resources to pt and family.

## 2017-09-20 NOTE — ED Provider Notes (Signed)
[facility]-EMERGENCY DEPT Provider Note   CSN: [id number] Arrival date & time: 09/20/17  4098     History   Chief Complaint Chief Complaint  Patient presents with  . Alcohol Problem    HPI Nicholas Price is a 46 y.o. male.  HPI  Patient presents with concern of alcohol dependency. Patient has a history of recurrent episodes of on-call dependency, and is currently drinking for at least the past few months. He drinks a variable amount daily, but drinks almost every day. He does have history of depression, takes SSRI He notes that in the prior episodes of alcohol cessation he has had hallucination, denies history of seizure. Patient is here with his wife who assists with the history of present illness. He denies physical pain, states over the past few days he has a sense of impending doom, as well as generalized discomfort, weakness, shaking in spite of ongoing alcohol use. No drug use, no cigarette smoking.  Past Medical History:  Diagnosis Date  . Alcohol abuse   . Depression   . Hypertension     Patient Active Problem List   Diagnosis Date Noted  . 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 Insomnia 07/07/2015  . Anxiety and depression   . Hypertension 07/10/2012    History reviewed. No pertinent surgical history.     Home Medications    Prior to Admission medications   Medication Sig Start Date End Date Taking? Authorizing Provider  diphenhydrAMINE (BENADRYL) 25 MG tablet Take 1-3 mg by mouth at bedtime as needed for sleep.   Yes [provider]  escitalopram (LEXAPRO) 20 MG tablet Take 1 tablet (20 mg total) by mouth daily. 09/03/17  Yes Baity, Salvadore Oxford, NP  ibuprofen (ADVIL,MOTRIN) 200 MG tablet Take 400 mg by mouth every 6 (six) hours as needed for headache.   Yes [provider]  lisinopril (PRINIVIL,ZESTRIL) 20 MG tablet Take 1 tablet (20 mg total) by mouth daily. Patient taking differently: Take 20 mg by mouth at bedtime.  07/01/17  Yes Lorre Munroe, NP  Melatonin 5 MG TABS Take 2.5 mg by mouth at bedtime as needed.   Yes [provider]  Multiple Vitamin (MULTIVITAMIN WITH MINERALS) TABS tablet Take 1 tablet by mouth at bedtime.   Yes [provider]  traZODone (DESYREL) 50 MG tablet Take 0.5-1 tablets (25-50 mg total) by mouth at bedtime as needed for sleep. 09/03/17  Yes Lorre Munroe, NP    Family History Family History  Problem Relation Age of Onset  . Anxiety disorder Brother   . Cancer Neg Hx   . Diabetes Neg Hx   . Stroke Neg Hx   . Heart disease Neg Hx     Social History Social History  Substance Use Topics  . Smoking status: Former Smoker    Types: Cigarettes  . Smokeless tobacco: Never Used     Comment: quit 2013  . Alcohol use 0.0 oz/week     Comment: Pt states he drinks about 1 beer to a 1/5th of liquor per day,  and his last drink  last nite at 2am     Allergies   Patient has no known allergies.   Review of Systems Review of Systems  Constitutional:       Per HPI, otherwise negative  HENT:       Per HPI, otherwise negative  Respiratory:       Per HPI, otherwise negative  Cardiovascular:       Per  HPI, otherwise negative  Gastrointestinal: Negative for vomiting.  Endocrine:       Negative aside from HPI  Genitourinary:       Neg aside from HPI   Musculoskeletal:       Per HPI, otherwise negative  Skin: Negative.   Neurological: Negative for syncope.  Psychiatric/Behavioral:       Per history of present illness     Physical Exam Updated Vital Signs BP (!) 152/108 (BP Location: Left Arm)   Pulse (!) 110   Temp 98.7 F (37.1 C) (Oral)   Resp 16   SpO2 94%   Physical Exam  Constitutional: He is oriented to person, place, and time. He appears well-developed. No distress.  HENT:  Head: Normocephalic and atraumatic.  Eyes: Conjunctivae and EOM are normal.  Cardiovascular: Regular rhythm.  Tachycardia present.   Pulmonary/Chest: Effort normal. No stridor. No  respiratory distress.  Abdominal: He exhibits no distension.  Musculoskeletal: He exhibits no edema.  Neurological: He is alert and oriented to person, place, and time. He displays no seizure activity.  Skin: Skin is warm and dry.  Psychiatric: His mood appears anxious. Thought content is not delusional. Cognition and memory are not impaired.  Nursing note and vitals reviewed.    ED Treatments / Results  Labs (all labs ordered are listed, but only abnormal results are displayed) Labs Reviewed  COMPREHENSIVE METABOLIC PANEL - Abnormal; Notable for the following:       Result Value   Glucose, Bld 104 (*)    Calcium 8.7 (*)    AST 105 (*)    ALT 117 (*)    All other components within normal limits  ETHANOL - Abnormal; Notable for the following:    Alcohol, Ethyl (B) 265 (*)    All other components within normal limits  CBC - Abnormal; Notable for the following:    Platelets 138 (*)    All other components within normal limits  RAPID URINE DRUG SCREEN, HOSP PERFORMED     Procedures Procedures (including critical care time)  Medications Ordered in ED Medications  chlordiazePOXIDE (LIBRIUM) capsule 25 mg (25 mg Oral Given 09/20/17 1421)     Initial Impression / Assessment and Plan / ED Course  I have reviewed the triage vital signs and the nursing notes.  Pertinent labs & imaging results that were available during my care of the patient were reviewed by me and considered in my medical decision making (see chart for details).  After the initial evaluation patient received a dose of Librium. Subsequent discussed the patient's case with our social work team to facilitate transition to alcohol rehabilitation facility. Patient denies history of seizures, is awake and alert, speaking clearly, has no evidence for Acute withdrawal currently. Given the patient's need for ongoing therapy, the patient was discharged on a course of Librium on the companionship of his wife to proceed  outpatient therapy.  3:13 PM Patient acknowledges importance of following up for ongoing therapy for alcohol dependency.  Final Clinical Impressions(s) / ED Diagnoses  Alcohol dependency   Gerhard Munch, MD 09/20/17 1513

## 2017-09-20 NOTE — Discharge Instructions (Signed)
As discussed, it is important that you use the provided resources to arrange follow-up at one of the local treatment facilities. Return here for concerning changes in your condition.

## 2017-09-20 NOTE — ED Triage Notes (Signed)
Pt has not been sleeping from restless leg syndrome and been drinking etoh to help him sleep and it has got out of control.  Pt is drinking between 1 beer to 1/5th of liquor per day and states he only drinks 2 twice a week and states he has been an alcoholic in the past.  Pt last etoh was at 2am. No SI/HI.

## 2017-09-20 NOTE — Progress Notes (Signed)
CSW provided pt with outpatient and residential substance abuse resources. CSW offered support and offered to reach out facilities to pt if needed. CSW encouraged pt to reach out to facilities when pt is ready and willing to participate. At this time there are no further CSW needs. CSW signing off.    Claude Manges Ashtyn Freilich, MSW, LCSW-A Emergency Department Clinical Social Worker [phone]

## 2017-09-20 NOTE — Progress Notes (Signed)
Type of Service:Brief Assessment    SUBJECTIVE: Holt Woolbright is a 46 y.o. male referred by Dr. Jeraldine Loots for: alcohol abuse symptoms of  anxiety and depression. Stressors related to: none  Patient was accompanied by significant other [name]) at bedside. Patient reports the following symptoms/concerns: pt considers self to be an alcoholic as pt started drinking in early to mid 20's per report.     LIFE CONTEXT:  Family & Social:patient lives with significant other and step-son.       INTERVENTION: Reflective listening, Community Resource    ISSUES DISCUSSED: anxiety,depression, resources that have been utilized in the past with alcohol use,supports in and out of the home and ways to reach out to community resources.     ASSESSMENT:Patient currently experiencing alcohol use with symptoms exacerbated by anxiety and depression per pt report. Patient may benefit from, and is in agreement to receive outpatient substance use resources as pt has utilized several residential facilities in the past.     PLAN: No further intervention required at this time . Referral:Community Resources:  Outpatient and Residential Substance Use resources     Claude Manges. Everest Brod, MSW, LCSW-A Emergency Department Clinical Social Worker [phone]

## 2017-09-23 MED ORDER — BUSPIRONE HCL 10 MG PO TABS: 10.0000 mg | ORAL_TABLET | Freq: Two times a day (BID) | ORAL | 2 refills | Status: DC

## 2017-09-27 MED ORDER — BUSPIRONE HCL 10 MG PO TABS: 10.0000 mg | ORAL_TABLET | Freq: Two times a day (BID) | ORAL | 2 refills | Status: DC

## 2017-09-27 NOTE — Addendum Note (Signed)
Addended by: Lorre Munroe on: 09/27/2017 01:47 PM   Modules accepted: Orders

## 2017-11-26 ENCOUNTER — Ambulatory Visit (HOSPITAL_COMMUNITY)
Admission: RE | Admit: 2017-11-26 | Discharge: 2017-11-26 | Disposition: A | Discharge status: Home / Self Care | Payer: Self-pay | Attending: Psychiatry | Admitting: Psychiatry

## 2017-11-26 ENCOUNTER — Encounter (HOSPITAL_COMMUNITY): Payer: Self-pay | Admitting: Emergency Medicine

## 2017-11-26 ENCOUNTER — Other Ambulatory Visit: Payer: Self-pay

## 2017-11-26 ENCOUNTER — Inpatient Hospital Stay (HOSPITAL_COMMUNITY)
Admission: EM | Admit: 2017-11-26 | Discharge: 2017-11-29 | DRG: 897 | Disposition: A | Discharge status: Home / Self Care | Payer: Self-pay | Attending: Internal Medicine | Admitting: Internal Medicine

## 2017-11-26 DIAGNOSIS — F32A Depression, unspecified: Secondary | ICD-10-CM | POA: Diagnosis present

## 2017-11-26 DIAGNOSIS — Z79899 Other long term (current) drug therapy: Secondary | ICD-10-CM

## 2017-11-26 DIAGNOSIS — Z87891 Personal history of nicotine dependence: Secondary | ICD-10-CM

## 2017-11-26 DIAGNOSIS — F1010 Alcohol abuse, uncomplicated: Secondary | ICD-10-CM | POA: Insufficient documentation

## 2017-11-26 DIAGNOSIS — Y908 Blood alcohol level of 240 mg/100 ml or more: Secondary | ICD-10-CM | POA: Diagnosis present

## 2017-11-26 DIAGNOSIS — R7989 Other specified abnormal findings of blood chemistry: Secondary | ICD-10-CM | POA: Diagnosis present

## 2017-11-26 DIAGNOSIS — R109 Unspecified abdominal pain: Secondary | ICD-10-CM | POA: Insufficient documentation

## 2017-11-26 DIAGNOSIS — G4700 Insomnia, unspecified: Secondary | ICD-10-CM | POA: Insufficient documentation

## 2017-11-26 DIAGNOSIS — I159 Secondary hypertension, unspecified: Secondary | ICD-10-CM

## 2017-11-26 DIAGNOSIS — Z818 Family history of other mental and behavioral disorders: Secondary | ICD-10-CM

## 2017-11-26 DIAGNOSIS — F10939 Alcohol use, unspecified with withdrawal, unspecified: Secondary | ICD-10-CM | POA: Diagnosis present

## 2017-11-26 DIAGNOSIS — R740 Nonspecific elevation of levels of transaminase and lactic acid dehydrogenase [LDH]: Secondary | ICD-10-CM | POA: Diagnosis present

## 2017-11-26 DIAGNOSIS — F10932 Alcohol use, unspecified with withdrawal with perceptual disturbance: Secondary | ICD-10-CM

## 2017-11-26 DIAGNOSIS — K219 Gastro-esophageal reflux disease without esophagitis: Secondary | ICD-10-CM | POA: Diagnosis present

## 2017-11-26 DIAGNOSIS — I10 Essential (primary) hypertension: Secondary | ICD-10-CM | POA: Diagnosis present

## 2017-11-26 DIAGNOSIS — F10239 Alcohol dependence with withdrawal, unspecified: Secondary | ICD-10-CM

## 2017-11-26 DIAGNOSIS — R945 Abnormal results of liver function studies: Secondary | ICD-10-CM | POA: Diagnosis present

## 2017-11-26 DIAGNOSIS — F10129 Alcohol abuse with intoxication, unspecified: Secondary | ICD-10-CM

## 2017-11-26 DIAGNOSIS — F10232 Alcohol dependence with withdrawal with perceptual disturbance: Principal | ICD-10-CM | POA: Diagnosis present

## 2017-11-26 DIAGNOSIS — R1011 Right upper quadrant pain: Secondary | ICD-10-CM | POA: Diagnosis present

## 2017-11-26 DIAGNOSIS — F419 Anxiety disorder, unspecified: Secondary | ICD-10-CM | POA: Diagnosis present

## 2017-11-26 DIAGNOSIS — D696 Thrombocytopenia, unspecified: Secondary | ICD-10-CM | POA: Diagnosis present

## 2017-11-26 DIAGNOSIS — F329 Major depressive disorder, single episode, unspecified: Secondary | ICD-10-CM | POA: Diagnosis present

## 2017-11-26 DIAGNOSIS — R451 Restlessness and agitation: Secondary | ICD-10-CM | POA: Insufficient documentation

## 2017-11-26 LAB — COMPREHENSIVE METABOLIC PANEL
ALBUMIN: 4.4 g/dL (ref 3.5–5.0)
ALT: 146 U/L — ABNORMAL HIGH (ref 17–63)
ANION GAP: 14 (ref 5–15)
AST: 133 U/L — AB (ref 15–41)
Alkaline Phosphatase: 78 U/L (ref 38–126)
BILIRUBIN TOTAL: 1.4 mg/dL — AB (ref 0.3–1.2)
BUN: 6 mg/dL (ref 6–20)
CHLORIDE: 101 mmol/L (ref 101–111)
CO2: 25 mmol/L (ref 22–32)
Calcium: 9.2 mg/dL (ref 8.9–10.3)
Creatinine, Ser: 0.83 mg/dL (ref 0.61–1.24)
GFR calc Af Amer: >60 mL/min (ref >60)
GFR calc non Af Amer: >60 mL/min (ref >60)
GLUCOSE: 101 mg/dL — AB (ref 65–99)
POTASSIUM: 3.6 mmol/L (ref 3.5–5.1)
SODIUM: 140 mmol/L (ref 135–145)
TOTAL PROTEIN: 8.2 g/dL — AB (ref 6.5–8.1)

## 2017-11-26 LAB — ETHANOL: Alcohol, Ethyl (B): 275 mg/dL — ABNORMAL HIGH (ref <10)

## 2017-11-26 LAB — RAPID URINE DRUG SCREEN, HOSP PERFORMED
AMPHETAMINES: NOT DETECTED
BARBITURATES: NOT DETECTED
BENZODIAZEPINES: NOT DETECTED
COCAINE: NOT DETECTED
Opiates: NOT DETECTED
TETRAHYDROCANNABINOL: NOT DETECTED

## 2017-11-26 LAB — LIPASE, BLOOD: LIPASE: 32 U/L (ref 11–51)

## 2017-11-26 LAB — CBC WITH DIFFERENTIAL/PLATELET
BASOS ABS: 0 10*3/uL (ref 0.0–0.1)
BASOS PCT: 0 %
EOS ABS: 0.1 10*3/uL (ref 0.0–0.7)
EOS PCT: 1 %
HCT: 52.2 % — ABNORMAL HIGH (ref 39.0–52.0)
Hemoglobin: 19 g/dL — ABNORMAL HIGH (ref 13.0–17.0)
Lymphocytes Relative: 33 %
Lymphs Abs: 2.5 10*3/uL (ref 0.7–4.0)
MCH: 34.3 pg — ABNORMAL HIGH (ref 26.0–34.0)
MCHC: 36.4 g/dL — ABNORMAL HIGH (ref 30.0–36.0)
MCV: 94.2 fL (ref 78.0–100.0)
MONO ABS: 0.5 10*3/uL (ref 0.1–1.0)
Monocytes Relative: 7 %
Neutro Abs: 4.4 10*3/uL (ref 1.7–7.7)
Neutrophils Relative %: 59 %
PLATELETS: 169 10*3/uL (ref 150–400)
RBC: 5.54 MIL/uL (ref 4.22–5.81)
RDW: 12.2 % (ref 11.5–15.5)
WBC: 7.5 10*3/uL (ref 4.0–10.5)

## 2017-11-26 LAB — URINALYSIS, ROUTINE W REFLEX MICROSCOPIC
BACTERIA UA: NONE SEEN
Bilirubin Urine: NEGATIVE
GLUCOSE, UA: NEGATIVE mg/dL
KETONES UR: NEGATIVE mg/dL
LEUKOCYTES UA: NEGATIVE
NITRITE: NEGATIVE
PH: 6 (ref 5.0–8.0)
PROTEIN: 30 mg/dL — AB
Specific Gravity, Urine: 1.009 (ref 1.005–1.030)
Squamous Epithelial / LPF: NONE SEEN

## 2017-11-26 LAB — APTT: APTT: 36 s (ref 24–36)

## 2017-11-26 LAB — PROTIME-INR
INR: 1.25
Prothrombin Time: 15.6 seconds — ABNORMAL HIGH (ref 11.4–15.2)

## 2017-11-26 MED ORDER — ONDANSETRON HCL 4 MG/2ML IJ SOLN: 4.0000 mg | Freq: Three times a day (TID) | INTRAMUSCULAR | Status: DC | PRN

## 2017-11-26 MED ORDER — ENOXAPARIN SODIUM 40 MG/0.4ML ~~LOC~~ SOLN
40.0000 mg | SUBCUTANEOUS | Status: DC
  Administered 2017-11-26 – 2017-11-28 (×3): 40 mg via SUBCUTANEOUS
  Filled 2017-11-26 (×4): qty 0.4

## 2017-11-26 MED ORDER — LORAZEPAM 1 MG PO TABS: 2.0000 mg | ORAL_TABLET | Freq: Once | ORAL | Status: DC

## 2017-11-26 MED ORDER — THIAMINE HCL 100 MG/ML IJ SOLN
100.0000 mg | Freq: Every day | INTRAMUSCULAR | Status: DC
  Administered 2017-11-26: 100 mg via INTRAVENOUS
  Filled 2017-11-26: qty 2

## 2017-11-26 MED ORDER — LORAZEPAM 2 MG/ML IJ SOLN
2.0000 mg | Freq: Once | INTRAMUSCULAR | Status: AC
  Administered 2017-11-26: 2 mg via INTRAVENOUS
  Filled 2017-11-26: qty 1

## 2017-11-26 MED ORDER — LORAZEPAM 1 MG PO TABS: 1.0000 mg | ORAL_TABLET | Freq: Four times a day (QID) | ORAL | Status: DC | PRN

## 2017-11-26 MED ORDER — SODIUM CHLORIDE 0.9 % IV BOLUS (SEPSIS)
2000.0000 mL | Freq: Once | INTRAVENOUS | Status: AC
  Administered 2017-11-26: 2000 mL via INTRAVENOUS

## 2017-11-26 MED ORDER — VITAMIN B-1 100 MG PO TABS
100.0000 mg | ORAL_TABLET | Freq: Every day | ORAL | Status: DC
  Administered 2017-11-27 – 2017-11-29 (×3): 100 mg via ORAL
  Filled 2017-11-26 (×3): qty 1

## 2017-11-26 MED ORDER — TRAZODONE HCL 50 MG PO TABS: 25.0000 mg | ORAL_TABLET | Freq: Every evening | ORAL | Status: DC | PRN

## 2017-11-26 MED ORDER — LORAZEPAM 2 MG/ML IJ SOLN
0.0000 mg | Freq: Four times a day (QID) | INTRAMUSCULAR | Status: AC
  Administered 2017-11-26: 2 mg via INTRAVENOUS
  Administered 2017-11-27: 1 mg via INTRAVENOUS
  Administered 2017-11-27: 2 mg via INTRAVENOUS
  Filled 2017-11-26 (×2): qty 1

## 2017-11-26 MED ORDER — CHLORDIAZEPOXIDE HCL 5 MG PO CAPS
10.0000 mg | ORAL_CAPSULE | Freq: Three times a day (TID) | ORAL | Status: DC
  Administered 2017-11-26 – 2017-11-29 (×8): 10 mg via ORAL
  Filled 2017-11-26 (×9): qty 2

## 2017-11-26 MED ORDER — MELATONIN 5 MG PO TABS: 2.5000 mg | ORAL_TABLET | Freq: Every evening | ORAL | Status: DC | PRN

## 2017-11-26 MED ORDER — OXYCODONE HCL 5 MG PO TABS: 5.0000 mg | ORAL_TABLET | Freq: Four times a day (QID) | ORAL | Status: DC | PRN

## 2017-11-26 MED ORDER — FAMOTIDINE IN NACL 20-0.9 MG/50ML-% IV SOLN
20.0000 mg | Freq: Two times a day (BID) | INTRAVENOUS | Status: DC
  Administered 2017-11-26 – 2017-11-28 (×4): 20 mg via INTRAVENOUS
  Filled 2017-11-26 (×5): qty 50

## 2017-11-26 MED ORDER — BUSPIRONE HCL 10 MG PO TABS
10.0000 mg | ORAL_TABLET | Freq: Two times a day (BID) | ORAL | Status: DC
  Administered 2017-11-26 – 2017-11-29 (×6): 10 mg via ORAL
  Filled 2017-11-26 (×6): qty 1

## 2017-11-26 MED ORDER — ADULT MULTIVITAMIN W/MINERALS CH
1.0000 | ORAL_TABLET | Freq: Every day | ORAL | Status: DC
  Administered 2017-11-27 – 2017-11-29 (×3): 1 via ORAL
  Filled 2017-11-26 (×4): qty 1

## 2017-11-26 MED ORDER — SODIUM CHLORIDE 0.9 % IV SOLN
INTRAVENOUS | Status: DC
Start: 2017-11-26 — End: 2017-11-29
  Administered 2017-11-26 – 2017-11-28 (×4): via INTRAVENOUS

## 2017-11-26 MED ORDER — HYDRALAZINE HCL 20 MG/ML IJ SOLN
5.0000 mg | INTRAMUSCULAR | Status: DC | PRN
  Filled 2017-11-26: qty 0.25

## 2017-11-26 MED ORDER — THIAMINE HCL 100 MG/ML IJ SOLN: 100.0000 mg | Freq: Once | INTRAMUSCULAR | Status: DC

## 2017-11-26 MED ORDER — LORAZEPAM 2 MG/ML IJ SOLN: 0.0000 mg | Freq: Two times a day (BID) | INTRAMUSCULAR | Status: DC

## 2017-11-26 MED ORDER — LORAZEPAM 2 MG/ML IJ SOLN
1.0000 mg | Freq: Four times a day (QID) | INTRAMUSCULAR | Status: DC | PRN
  Administered 2017-11-27: 1 mg via INTRAVENOUS
  Filled 2017-11-26 (×2): qty 1

## 2017-11-26 MED ORDER — SODIUM CHLORIDE 0.9 % IV BOLUS (SEPSIS)
500.0000 mL | Freq: Once | INTRAVENOUS | Status: AC
  Administered 2017-11-26: 500 mL via INTRAVENOUS

## 2017-11-26 MED ORDER — ADULT MULTIVITAMIN W/MINERALS CH: 1.0000 | ORAL_TABLET | Freq: Every day | ORAL | Status: DC

## 2017-11-26 MED ORDER — FOLIC ACID 1 MG PO TABS
1.0000 mg | ORAL_TABLET | Freq: Every day | ORAL | Status: DC
  Administered 2017-11-26 – 2017-11-29 (×4): 1 mg via ORAL
  Filled 2017-11-26 (×4): qty 1

## 2017-11-26 MED ORDER — LISINOPRIL 20 MG PO TABS
20.0000 mg | ORAL_TABLET | Freq: Every day | ORAL | Status: DC
  Administered 2017-11-26 – 2017-11-29 (×4): 20 mg via ORAL
  Filled 2017-11-26 (×4): qty 1

## 2017-11-26 NOTE — Progress Notes (Signed)
PHARMACIST - PHYSICIAN ORDER COMMUNICATION  CONCERNING: P&T Medication Policy on Herbal Medications  DESCRIPTION:  This patient's order for:  Melatonin  has been noted.  This product(s) is classified as an "herbal" or natural product. Due to a lack of definitive safety studies or FDA approval, nonstandard manufacturing practices, plus the potential risk of unknown drug-drug interactions while on inpatient medications, the Pharmacy and Therapeutics Committee does not permit the use of "herbal" or natural products of this type within [facility].   ACTION TAKEN: The pharmacy department is unable to verify this order at this time and your patient has been informed of this safety policy. Please reevaluate patient's clinical condition at discharge and address if the herbal or natural product(s) should be resumed at that time.  Medha Pippen, PharmD  

## 2017-11-26 NOTE — ED Notes (Signed)
Patient wanded by security and belongings secured in nurses station.

## 2017-11-26 NOTE — BH Assessment (Signed)
Assessment Note  Nicholas Price is an 46 y.o. male [facility] with complaints of depression and alcohol. Patient report he has been experiencing depression and self medicating himself with alcohol for years. Patient report three days ago his wife kicked him out of their home and he has been living in his car for the past 3 days. Patient report he drinks 1/2 gallon Vodka daily. Report he has been dealing with depression for years, when asked  what triggered his depression patient stated, "I do not want to answer that question." Patient report he has attended 5 treatment facility in the past for either depression or substance abuse (alcohol). Patient report he has a diagnoses of Depression and Anxiety and is prescribed Lexapro by his family doctor. Patient denies suicidal and homicidal ideations. Patient depressive symptoms insomnia, digestive problems, lack of appetite, isolation, inability to meet obligations, need for alcohol, excessive crying, sad, feeling helplessness and hopelessness. Patient present with high risk for suicide as evidenced by history of depression and current depressive with symptoms, recent separation, homeless, and inability to discontinue to crying. Patient presents with other heath concerns. Patient report auditory and visual hallucinations. Patient stated as he cryed,"I see things that I know is not there such as zombies, skeletons, just evil and bad things. I hear things such as bate wings flying in my ear. Patient report a history of alcoholism starting at 46 year old. Patient state, "alcohol has ruined my life but I cannot stop drinking." Patient refused to provide detail of his triggers to start drinking so many years ago and continuation to drink daily. Patient report physical complications when he does not drink such as tremors, vomiting, hot/cold sweats, and dry halves.   Patient denies accept to weapons, legal complications or outpatient services. Patient present tearful and  crying. Patient was dressed appropriately. Patient speech was slow but logical / coherent. Eye contact poor, affect flat and depressed. Patient oriented x4        Diagnosis:  F33.2   Major depressive disorder, Recurrent episode, Severe                     F10.20  Alcohol use disorder, Severe  Disposition:   Per Hillery Jacksanika Lewis, NP, patient recommended inpatient  Past Medical History:  Past Medical History:  Diagnosis Date  . Alcohol abuse   . Depression   . Hypertension     No past surgical history on file.  Family History:  Family History  Problem Relation Age of Onset  . Anxiety disorder Brother   . Cancer Neg Hx   . Diabetes Neg Hx   . Stroke Neg Hx   . Heart disease Neg Hx     Social History:  reports that he has quit smoking. His smoking use included cigarettes. he has never used smokeless tobacco. He reports that he drinks alcohol. He reports that he does not use drugs.  Additional Social History:  Alcohol / Drug Use Pain Medications: see MAR Prescriptions: see MAR Over the Counter: see MAR History of alcohol / drug use?: Yes Substance #1 Name of Substance 1: Alcohol  1 - Age of First Use: 20 1 - Amount (size/oz): 1/2 gallon per day  1 - Frequency: daily  1 - Duration: ongoing 1 - Last Use / Amount: 11/26/2017  CIWA:   COWS:    Allergies: No Known Allergies  Home Medications:  (Not in a hospital admission)  OB/GYN Status:  No LMP for male patient.  General Assessment  Data Location of Assessment: [facility] Assessment Services TTS Assessment: In system Is this a Tele or Face-to-Face Assessment?: Face-to-Face Is this an Initial Assessment or a Re-assessment for this encounter?: Initial Assessment Marital status: Separated Maiden name: n/a Is patient pregnant?: No Pregnancy Status: No Living Arrangements: Other (Comment)(homeless/report wife kicked him out of the house 3 days ago) Can pt return to current living arrangement?: Yes Admission Status:  Voluntary Is patient capable of signing voluntary admission?: Yes Referral Source: Self/Family/Friend Insurance type: Scientist, research (physical sciences) Exam [facility] Walk-in ONLY) Medical Exam completed: Yes  Crisis Care Plan Living Arrangements: Other (Comment)(homeless/report wife kicked him out of the house 3 days ago) Name of Psychiatrist: denies(report meds mgt through primary doctor) Name of Therapist: none report  Education Status Is patient currently in school?: No Highest grade of school patient has completed: Some College  Risk to self with the past 6 months Suicidal Ideation: No Has patient been a risk to self within the past 6 months prior to admission? : Yes(Pt high risk, depressive symtoms and heavy alcohol usage) Suicidal Intent: No Has patient had any suicidal intent within the past 6 months prior to admission? : No Is patient at risk for suicide?: Yes(depressive symptoms, heavy alcohol usage) Suicidal Plan?: No Has patient had any suicidal plan within the past 6 months prior to admission? : No Access to Means: No What has been your use of drugs/alcohol within the last 12 months?: alcohol  Previous Attempts/Gestures: No How many times?: 0 Other Self Harm Risks: none report Intentional Self Injurious Behavior: None Family Suicide History: No Recent stressful life event(s): Conflict (Comment)(wife kicked patient out of the home 3 days ago) Persecutory voices/beliefs?: Yes Depression: Yes Depression Symptoms: Insomnia, Tearfulness, Isolating, Guilt, Loss of interest in usual pleasures, Feeling worthless/self pity Substance abuse history and/or treatment for substance abuse?: Yes Suicide prevention information given to non-admitted patients: Not applicable  Risk to Others within the past 6 months Homicidal Ideation: No Does patient have any lifetime risk of violence toward others beyond the six months prior to admission? : No Thoughts of Harm to Others: No Current Homicidal  Intent: No Current Homicidal Plan: No Access to Homicidal Means: No Identified Victim: n/a History of harm to others?: No Assessment of Violence: None Noted Violent Behavior Description: none noted Does patient have access to weapons?: No Criminal Charges Pending?: No Does patient have a court date: No Is patient on probation?: No  Psychosis Hallucinations: Auditory, Visual(visual-zombies, bates flying/auditory) Delusions: None noted  Mental Status Report Appearance/Hygiene: Unremarkable Eye Contact: Fair Motor Activity: Freedom of movement Speech: Logical/coherent Level of Consciousness: Crying Mood: Depressed, Sad, Worthless, low self-esteem Affect: Depressed, Sad Anxiety Level: Severe Thought Processes: Thought Blocking(severaly depressed, depressive thoughts ) Judgement: Partial Orientation: Person, Place, Time, Situation Obsessive Compulsive Thoughts/Behaviors: None  Cognitive Functioning Concentration: Poor Memory: Recent Intact, Remote Intact IQ: Average Insight: see judgement above Impulse Control: Fair Appetite: Poor Sleep: Decreased Total Hours of Sleep: 3(patient report 3 hours sleep per night) Vegetative Symptoms: None  ADLScreening [facility] Assessment Services) Patient's cognitive ability adequate to safely complete daily activities?: Yes Patient able to express need for assistance with ADLs?: Yes Independently performs ADLs?: Yes (appropriate for developmental age)  Prior Inpatient Therapy Prior Inpatient Therapy: Yes(Report over 5 inpatient hospitalizations) Reason for Treatment: depression / substance abuse  Prior Outpatient Therapy Prior Outpatient Therapy: Yes Prior Therapy Dates: years ago Does patient have an ACCT team?: No Does patient have Intensive In-House Services?  : No Does patient have  [facility] services? : No Does patient have P4CC services?: No  ADL Screening (condition at time of admission) Patient's cognitive ability adequate to  safely complete daily activities?: Yes Is the patient deaf or have difficulty hearing?: No Does the patient have difficulty seeing, even when wearing glasses/contacts?: No Does the patient have difficulty concentrating, remembering, or making decisions?: No Patient able to express need for assistance with ADLs?: Yes Does the patient have difficulty dressing or bathing?: No Independently performs ADLs?: Yes (appropriate for developmental age) Does the patient have difficulty walking or climbing stairs?: No       Abuse/Neglect Assessment (Assessment to be complete while patient is alone) Abuse/Neglect Assessment Can Be Completed: Yes Physical Abuse: Denies Verbal Abuse: Denies Sexual Abuse: Denies Exploitation of patient/patient's resources: Denies Self-Neglect: Denies     Merchant navy officerAdvance Directives (For Healthcare) Does Patient Have a Medical Advance Directive?: No Would patient like information on creating a medical advance directive?: No - Patient declined    Additional Information 1:1 In Past 12 Months?: No CIRT Risk: No Elopement Risk: No Does patient have medical clearance?: No([facility])     Disposition:   Per Hillery Jacksanika Lewis, NP, patient recommended inpatient Disposition Initial Assessment Completed for this Encounter: Yes Disposition of Patient: Inpatient treatment program Type of inpatient treatment program: Adult  On Site Evaluation by:   Reviewed with Physician:    Dian Situelvondria Jamey Harman 11/26/2017 3:36 PM

## 2017-11-26 NOTE — ED Notes (Signed)
Delay due to needing EKG.

## 2017-11-26 NOTE — ED Provider Notes (Addendum)
[facility]-EMERGENCY DEPT Provider Note   CSN: [id number] Arrival date & time: 11/26/17  1552     History   Chief Complaint Chief Complaint  Patient presents with  . Medical Clearance  . Alcohol Intoxication    HPI Nicholas Price is a 46 y.o. male.  HPI Patient presents with alcohol withdrawal.  Over the last 3 days he has drank around 1/2 gallon of vodka a day.  Over the last 2 years since his longest sobriety has been around 30 days.  History of alcohol use and depression.  States he is sad but not sure that he is strong enough to stop right now.  Not suicidal.  Has had some hallucinations.  States that his auditory at this time but will probably go to visual.  Has not had seizures in the past.  Last drink was earlier this morning.  Was seen at behavioral health and sent here for right upper quadrant abdominal pain. Past Medical History:  Diagnosis Date  . Alcohol abuse   . Depression   . Hypertension     Patient Active Problem List   Diagnosis Date Noted  . 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 Insomnia 07/07/2015  . Anxiety and depression   . Hypertension 07/10/2012    History reviewed. No pertinent surgical history.     Home Medications    Prior to Admission medications   Medication Sig Start Date End Date Taking? Authorizing Provider  busPIRone (BUSPAR) 10 MG tablet Take 1 tablet (10 mg total) by mouth 2 (two) times daily. 09/27/17  Yes Baity, Salvadore Oxford, NP  lisinopril (PRINIVIL,ZESTRIL) 20 MG tablet Take 1 tablet (20 mg total) by mouth daily. Patient taking differently: Take 20 mg by mouth at bedtime.  07/01/17  Yes Lorre Munroe, NP  Multiple Vitamin (MULTIVITAMIN WITH MINERALS) TABS tablet Take 1 tablet by mouth daily.    Yes [provider]  traZODone (DESYREL) 50 MG tablet Take 0.5-1 tablets (25-50 mg total) by mouth at bedtime as needed for sleep. Patient taking differently: Take 50 mg by mouth at bedtime.  09/03/17  Yes  Lorre Munroe, NP  chlordiazePOXIDE (LIBRIUM) 25 MG capsule 50mg  PO TID x 1D, then 25-50mg  PO BID X 1D, then 25-50mg  PO QD X 1D Patient not taking: Reported on 11/26/2017 09/20/17   Gerhard Munch, MD  diphenhydrAMINE (BENADRYL) 25 MG tablet Take 1-3 mg by mouth at bedtime as needed for sleep.    [provider]  escitalopram (LEXAPRO) 20 MG tablet Take 1 tablet (20 mg total) by mouth daily. Patient not taking: Reported on 11/26/2017 09/03/17   Lorre Munroe, NP  ibuprofen (ADVIL,MOTRIN) 200 MG tablet Take 400 mg by mouth every 6 (six) hours as needed for headache.    [provider]  Melatonin 5 MG TABS Take 2.5 mg by mouth at bedtime as needed.    [provider]    Family History Family History  Problem Relation Age of Onset  . Anxiety disorder Brother   . Cancer Neg Hx   . Diabetes Neg Hx   . Stroke Neg Hx   . Heart disease Neg Hx     Social History Social History   Tobacco Use  . Smoking status: Former Smoker    Types: Cigarettes  . Smokeless tobacco: Never Used  . Tobacco comment: quit 2013  Substance Use Topics  . Alcohol use: Yes    Alcohol/week: 0.0 oz    Comment: Pt states he  drinks about 1 beer to a 1/5th of liquor per day,  and his last drink  last nite at 2am  . Drug use: No     Allergies   Patient has no known allergies.   Review of Systems Review of Systems  Constitutional: Negative for fever.  HENT: Negative for congestion.   Respiratory: Negative for shortness of breath.   Cardiovascular: Negative for chest pain.  Gastrointestinal: Positive for abdominal pain.  Genitourinary: Negative for frequency.  Musculoskeletal: Negative for back pain.  Skin: Negative for rash.  Neurological: Negative for tremors.  Psychiatric/Behavioral: Positive for dysphoric mood and hallucinations.     Physical Exam Updated Vital Signs BP (!) 145/112 (BP Location: Left Arm)   Pulse (!) 118   Temp 99.4 F (37.4 C) (Oral)   Resp 16   Ht  6\' 2"  (1.88 m)   Wt 135.2 kg (298 lb)   SpO2 92%   BMI 38.26 kg/m   Physical Exam  Constitutional: He appears well-developed.  HENT:  Head: Atraumatic.  Neck: Neck supple.  Cardiovascular:  Mild tachycardia  Pulmonary/Chest: Effort normal.  Abdominal: Soft. There is no tenderness.  Musculoskeletal: He exhibits no edema.  Neurological: He is alert.  Skin: Skin is warm. Capillary refill takes less than 2 seconds.  Psychiatric:  Patient is somewhat tearful     ED Treatments / Results  Labs (all labs ordered are listed, but only abnormal results are displayed) Labs Reviewed  COMPREHENSIVE METABOLIC PANEL - Abnormal; Notable for the following components:      Result Value   Glucose, Bld 101 (*)    Total Protein 8.2 (*)    AST 133 (*)    ALT 146 (*)    Total Bilirubin 1.4 (*)    All other components within normal limits  ETHANOL - Abnormal; Notable for the following components:   Alcohol, Ethyl (B) 275 (*)    All other components within normal limits  CBC WITH DIFFERENTIAL/PLATELET - Abnormal; Notable for the following components:   Hemoglobin 19.0 (*)    HCT 52.2 (*)    MCH 34.3 (*)    MCHC 36.4 (*)    All other components within normal limits  URINALYSIS, ROUTINE W REFLEX MICROSCOPIC - Abnormal; Notable for the following components:   Hgb urine dipstick LARGE (*)    Protein, ur 30 (*)    All other components within normal limits  RAPID URINE DRUG SCREEN, HOSP PERFORMED  LIPASE, BLOOD    EKG  EKG Interpretation None       Radiology No results found.  Procedures Procedures (including critical care time)  Medications Ordered in ED Medications  thiamine (B-1) injection 100 mg (not administered)  sodium chloride 0.9 % bolus 500 mL (0 mLs Intravenous Stopped 11/26/17 1952)  LORazepam (ATIVAN) injection 2 mg (2 mg Intravenous Given 11/26/17 1852)     Initial Impression / Assessment and Plan / ED Course  I have reviewed the triage vital signs and the  nursing notes.  Pertinent labs & imaging results that were available during my care of the patient were reviewed by me and considered in my medical decision making (see chart for details).     Patient is an alcoholic who presents for treatment.  Last drink around 9 hours ago.  Alcohol level is still 270, but he is Artie began to have some hallucinations.  Some tachycardia and hypertension 2.  No previous seizures with withdrawal but I think patient would benefit from medical admission for  closer monitoring.  Will admit to hospitalist. CRITICAL CARE Performed by: Benjiman CoreNathan Suheyb Raucci Total critical care time: 30 minutes Critical care time was exclusive of separately billable procedures and treating other patients. Critical care was necessary to treat or prevent imminent or life-threatening deterioration. Critical care was time spent personally by me on the following activities: development of treatment plan with patient and/or surrogate as well as nursing, discussions with consultants, evaluation of patient's response to treatment, examination of patient, obtaining history from patient or surrogate, ordering and performing treatments and interventions, ordering and review of laboratory studies, ordering and review of radiographic studies, pulse oximetry and re-evaluation of patient's condition.   Final Clinical Impressions(s) / ED Diagnoses   Final diagnoses:  Alcohol withdrawal syndrome with perceptual disturbance [facility](HCC)    ED Discharge Orders    None       Benjiman CorePickering, Kert Shackett, MD 11/26/17 2012    Benjiman CorePickering, Oluwasemilore Pascuzzi, MD 11/26/17 2012

## 2017-11-26 NOTE — ED Notes (Signed)
ED TO INPATIENT HANDOFF REPORT  Name/Age/Gender Nicholas Price 46 y.o. male  Code Status    Code Status Orders  (From admission, onward)        Start     Ordered   11/26/17 2050  Full code  Continuous     11/26/17 2050    Code Status History    Date Active Date Inactive Code Status Order ID Comments User Context   05/20/2015 02:13 05/21/2015 13:40 Full Code [id number]  Ivor Costa, MD ED   07/09/2012 22:36 07/10/2012 22:33 Full Code [id number]  Hosmer, Violet Baldy, MD ED      Home/SNF/Other Home  Chief Complaint med clearance  Level of Care/Admitting Diagnosis ED Disposition    ED Disposition Condition [place]: [facility] [[id number]]  Level of Care: Telemetry [5]  Admit to tele based on following criteria: Other see comments  Comments: tachycardia  Diagnosis: Alcohol withdrawal ([facility]) [291.81.ICD-9-CM]  Admitting Physician: Ivor Costa [[id number]]  Attending Physician: Ivor Costa [phone]  Estimated length of stay: past midnight tomorrow  Certification:: I certify this patient will need inpatient services for at least 2 midnights  PT Class (Do Not Modify): Inpatient [101]  PT Acc Code (Do Not Modify): Private [1]       Medical History Past Medical History:  Diagnosis Date  . Alcohol abuse   . Depression   . Hypertension     Allergies No Known Allergies  IV Location/Drains/Wounds Patient Lines/Drains/Airways Status   Active Line/Drains/Airways    Name:   Placement date:   Placement time:   Site:   Days:   Peripheral IV 11/26/17 Left Forearm   11/26/17    1854    Forearm   less than 1          Labs/Imaging Results for orders placed or performed during the hospital encounter of 11/26/17 (from the past 48 hour(s))  Rapid urine drug screen (hospital performed)     Status: None   Collection Time: 11/26/17  4:32 PM  Result Value Ref Range   Opiates NONE DETECTED NONE DETECTED   Cocaine NONE DETECTED NONE DETECTED    Benzodiazepines NONE DETECTED NONE DETECTED   Amphetamines NONE DETECTED NONE DETECTED   Tetrahydrocannabinol NONE DETECTED NONE DETECTED   Barbiturates NONE DETECTED NONE DETECTED    Comment:        DRUG SCREEN FOR MEDICAL PURPOSES ONLY.  IF CONFIRMATION IS NEEDED FOR ANY PURPOSE, NOTIFY LAB WITHIN 5 DAYS.        LOWEST DETECTABLE LIMITS FOR URINE DRUG SCREEN Drug Class       Cutoff (ng/mL) Amphetamine      1000 Barbiturate      200 Benzodiazepine   643 Tricyclics       329 Opiates          300 Cocaine          300 THC              50   Urinalysis, Routine w reflex microscopic     Status: Abnormal   Collection Time: 11/26/17  4:37 PM  Result Value Ref Range   Color, Urine YELLOW YELLOW   APPearance CLEAR CLEAR   Specific Gravity, Urine 1.009 1.005 - 1.030   pH 6.0 5.0 - 8.0   Glucose, UA NEGATIVE NEGATIVE mg/dL   Hgb urine dipstick LARGE (A) NEGATIVE   Bilirubin Urine NEGATIVE NEGATIVE   Ketones, ur NEGATIVE NEGATIVE mg/dL   Protein, ur  30 (A) NEGATIVE mg/dL   Nitrite NEGATIVE NEGATIVE   Leukocytes, UA NEGATIVE NEGATIVE   RBC / HPF 6-30 0 - 5 RBC/hpf   WBC, UA 0-5 0 - 5 WBC/hpf   Bacteria, UA NONE SEEN NONE SEEN   Squamous Epithelial / LPF NONE SEEN NONE SEEN   Mucus PRESENT    Hyaline Casts, UA PRESENT    Granular Casts, UA PRESENT   Comprehensive metabolic panel     Status: Abnormal   Collection Time: 11/26/17  5:19 PM  Result Value Ref Range   Sodium 140 135 - 145 mmol/L   Potassium 3.6 3.5 - 5.1 mmol/L   Chloride 101 101 - 111 mmol/L   CO2 25 22 - 32 mmol/L   Glucose, Bld 101 (H) 65 - 99 mg/dL   BUN 6 6 - 20 mg/dL   Creatinine, Ser 0.83 0.61 - 1.24 mg/dL   Calcium 9.2 8.9 - 10.3 mg/dL   Total Protein 8.2 (H) 6.5 - 8.1 g/dL   Albumin 4.4 3.5 - 5.0 g/dL   AST 133 (H) 15 - 41 U/L   ALT 146 (H) 17 - 63 U/L   Alkaline Phosphatase 78 38 - 126 U/L   Total Bilirubin 1.4 (H) 0.3 - 1.2 mg/dL   GFR calc non Af Amer >60 >60 mL/min   GFR calc Af Amer >60 >60 mL/min     Comment: (NOTE) The eGFR has been calculated using the CKD EPI equation. This calculation has not been validated in all clinical situations. eGFR's persistently <60 mL/min signify possible Chronic Kidney Disease.    Anion gap 14 5 - 15  Ethanol     Status: Abnormal   Collection Time: 11/26/17  5:19 PM  Result Value Ref Range   Alcohol, Ethyl (B) 275 (H) <10 mg/dL    Comment:        LOWEST DETECTABLE LIMIT FOR SERUM ALCOHOL IS 10 mg/dL FOR MEDICAL PURPOSES ONLY   CBC with Differential     Status: Abnormal   Collection Time: 11/26/17  5:19 PM  Result Value Ref Range   WBC 7.5 4.0 - 10.5 K/uL   RBC 5.54 4.22 - 5.81 MIL/uL   Hemoglobin 19.0 (H) 13.0 - 17.0 g/dL   HCT 52.2 (H) 39.0 - 52.0 %   MCV 94.2 78.0 - 100.0 fL   MCH 34.3 (H) 26.0 - 34.0 pg   MCHC 36.4 (H) 30.0 - 36.0 g/dL   RDW 12.2 11.5 - 15.5 %   Platelets 169 150 - 400 K/uL   Neutrophils Relative % 59 %   Neutro Abs 4.4 1.7 - 7.7 K/uL   Lymphocytes Relative 33 %   Lymphs Abs 2.5 0.7 - 4.0 K/uL   Monocytes Relative 7 %   Monocytes Absolute 0.5 0.1 - 1.0 K/uL   Eosinophils Relative 1 %   Eosinophils Absolute 0.1 0.0 - 0.7 K/uL   Basophils Relative 0 %   Basophils Absolute 0.0 0.0 - 0.1 K/uL  Lipase, blood     Status: None   Collection Time: 11/26/17  5:19 PM  Result Value Ref Range   Lipase 32 11 - 51 U/L   No results found.  Pending Labs Unresulted Labs (From admission, onward)   Start     Ordered   11/26/17 2050  HIV antibody (Routine Testing)  Once,   R     11/26/17 2050   11/26/17 2048  Hepatitis panel, acute  Once,   R     11/26/17 2047  Vitals/Pain Today's Vitals   11/26/17 1626 11/26/17 1627 11/26/17 1858 11/26/17 2002  BP: (!) 161/110  (!) 141/103 (!) 145/112  Pulse: (!) 118  (!) 102 (!) 118  Resp: '18  16 16  '$ Temp: 99.4 F (37.4 C)     TempSrc: Oral     SpO2: 95%  93% 92%  Weight:  298 lb (135.2 kg)    Height:  '6\' 2"'$  (1.88 m)      Isolation Precautions No active  isolations  Medications Medications  sodium chloride 0.9 % bolus 2,000 mL (2,000 mLs Intravenous New Bag/Given 11/26/17 2132)  0.9 %  sodium chloride infusion (not administered)  chlordiazePOXIDE (LIBRIUM) capsule 10 mg (not administered)  busPIRone (BUSPAR) tablet 10 mg (not administered)  lisinopril (PRINIVIL,ZESTRIL) tablet 20 mg (20 mg Oral Given 11/26/17 2131)  multivitamin with minerals tablet 1 tablet (not administered)  traZODone (DESYREL) tablet 25-50 mg (not administered)  LORazepam (ATIVAN) tablet 1 mg (not administered)    Or  LORazepam (ATIVAN) injection 1 mg (not administered)  thiamine (VITAMIN B-1) tablet 100 mg ( Oral See Alternative 11/26/17 2132)    Or  thiamine (B-1) injection 100 mg (100 mg Intravenous Given 81/8/40 3754)  folic acid (FOLVITE) tablet 1 mg (not administered)  LORazepam (ATIVAN) injection 0-4 mg (not administered)    Followed by  LORazepam (ATIVAN) injection 0-4 mg (not administered)  famotidine (PEPCID) IVPB 20 mg premix (not administered)  ondansetron (ZOFRAN) injection 4 mg (not administered)  hydrALAZINE (APRESOLINE) injection 5 mg (not administered)  oxyCODONE (Oxy IR/ROXICODONE) immediate release tablet 5 mg (not administered)  enoxaparin (LOVENOX) injection 40 mg (not administered)  sodium chloride 0.9 % bolus 500 mL (0 mLs Intravenous Stopped 11/26/17 1952)  LORazepam (ATIVAN) injection 2 mg (2 mg Intravenous Given 11/26/17 1852)  LORazepam (ATIVAN) injection 2 mg (2 mg Intravenous Given 11/26/17 2131)    Mobility walks

## 2017-11-26 NOTE — ED Notes (Signed)
[facility]-states BP 160/119-history of ETOH and substance abuse

## 2017-11-26 NOTE — ED Notes (Signed)
Bed: WLPT3 Expected date:  Expected time:  Means of arrival:  Comments: 

## 2017-11-26 NOTE — ED Notes (Signed)
Bed: WA26 Expected date:  Expected time:  Means of arrival:  Comments: 

## 2017-11-26 NOTE — ED Triage Notes (Signed)
[facility] Pt states when they pressed on he is having severe tenderness to RUQ. Pt reports last drink 10:30 this am having about 4-5 shots of vodka. Pt reports feeling anxious and states he is starting to withdrawal. Denies SI/HI.

## 2017-11-26 NOTE — H&P (Signed)
Behavioral Health Medical Screening Exam  Nicholas Price is an 46 y.o. male.  Total Time spent with patient: 20 minutes  Psychiatric Specialty Exam: Physical Exam  Vitals reviewed. Constitutional: He is oriented to person, place, and time.  Neurological: He is alert and oriented to person, place, and time.  Psychiatric: He has a normal mood and affect.    Review of Systems  Gastrointestinal: Positive for abdominal pain.  Psychiatric/Behavioral: Positive for depression and substance abuse. The patient is nervous/anxious and has insomnia.     There were no vitals taken for this visit.There is no height or weight on file to calculate BMI.  General Appearance: Casual  Eye Contact:  Fair  Speech:  Clear and Coherent  Volume:  Normal  Mood:  Anxious and Depressed  Affect:  Depressed and Flat  Thought Process:  Coherent  Orientation:  Full (Time, Place, and Person)  Thought Content:  Hallucinations: None  Suicidal Thoughts:  No  Homicidal Thoughts:  No  Memory:  Immediate;   Fair Recent;   Fair Remote;   Fair  Judgement:  Fair  Insight:  Fair  Psychomotor Activity:  Restlessness  Concentration: Concentration: Fair  Recall:  [employer]Fair  Fund of Knowledge:Fair  Language: Good  Akathisia:  No  Handed:  Right  AIMS (if indicated):     Assets:  Communication Skills Desire for Improvement Physical Health Resilience Social Support  Sleep:       Musculoskeletal: Strength & Muscle Tone: within normal limits Gait & Station: normal Patient leans: N/A  There were no vitals taken for this visit. B/P 160/109 HR 88 RR 18 RM 97%  TEMP- Recommendations:  Based on my evaluation the patient appears to have an emergency medical condition for which I recommend the patient be transferred to the emergency department for further evaluation.   [facility]    Nicholas N Lewis, NP 11/26/2017, 3:02 PM

## 2017-11-26 NOTE — ED Notes (Signed)
Patient dressed in purple scrubs 

## 2017-11-26 NOTE — H&P (Signed)
History and Physical    Nicholas Price JYN:[id number] DOB: September 14, 1971 DOA: 11/26/2017  Referring MD/NP/PA:   PCP: Lorre MunroeBaity, Regina W, NP   Patient coming from:  The patient is coming from home.  At baseline, pt is independent for most of ADL.   Chief Complaint: alcohol intoxication and medical clearance,   HPI: Nicholas Sensingric Blau is a 46 y.o. male with medical history significant of hypertension, GERD, depression, alcohol abuse, who presents with alcohol intoxication and medical clearance.  [facility] He was sent to ED for medical clearance. Pt states that over the last 3 days he has drank around 1/2 gallon of vodka a day. He states that he wants to quit drinking, but not sure that he is strong enough to stop right now. He has auditory of hallucination, "hear bat wing flapping". Patient denies suicidal or homicidal ideations. Pt was seen at behavioral health and sent here for right upper quadrant abdominal pain.  Obviously [facility], but he denies any abdominal pain to me now, even after I repeatedly asked whether he has any abdominal pain. He also denies nausea, vomiting, diarrhea, symptoms of UTI. No unilateral weakness.  ED Course: pt was found to have alcohol level 275, WBC 7.5, negative UDS, lipase is 32, negative urinalysis, electrolytes renal function okay, tachycardia, no tachypnea, oxygen saturation 92% on room air, temp 99.4. Patient is admitted to telemetry bed as inpatient.  Review of Systems:   General: no fevers, chills, no body weight gain, has fatigue HEENT: no blurry vision, hearing changes or sore throat Respiratory: no dyspnea, coughing, wheezing CV: no chest pain, no palpitations GI: no nausea, vomiting, abdominal pain, diarrhea, constipation GU: no dysuria, burning on urination, increased urinary frequency, hematuria  Ext: no leg edema Neuro: no unilateral weakness, numbness, or tingling, no vision change or hearing  loss. Has auditory hallucination. Skin: no rash, no skin tear. MSK: No muscle spasm, no deformity, no limitation of range of movement in spin Heme: No easy bruising.  Travel history: No recent long distant travel. I  Allergy: No Known Allergies  Past Medical History:  Diagnosis Date  . Alcohol abuse   . Depression   . Hypertension     History reviewed. No pertinent surgical history.  Social History:  reports that he has quit smoking. His smoking use included cigarettes. he has never used smokeless tobacco. He reports that he drinks alcohol. He reports that he does not use drugs.  Family History:  Family History  Problem Relation Age of Onset  . Anxiety disorder Brother   . Cancer Neg Hx   . Diabetes Neg Hx   . Stroke Neg Hx   . Heart disease Neg Hx      Prior to Admission medications   Medication Sig Start Date End Date Taking? Authorizing Provider  busPIRone (BUSPAR) 10 MG tablet Take 1 tablet (10 mg total) by mouth 2 (two) times daily. 09/27/17  Yes Baity, Salvadore Oxfordegina W, NP  lisinopril (PRINIVIL,ZESTRIL) 20 MG tablet Take 1 tablet (20 mg total) by mouth daily. Patient taking differently: Take 20 mg by mouth at bedtime.  07/01/17  Yes Lorre MunroeBaity, Regina W, NP  Multiple Vitamin (MULTIVITAMIN WITH MINERALS) TABS tablet Take 1 tablet by mouth daily.    Yes [provider]  traZODone (DESYREL) 50 MG tablet Take 0.5-1 tablets (25-50 mg total) by mouth at bedtime as needed for sleep. Patient taking differently: Take 50 mg by mouth at bedtime.  09/03/17  Yes Lorre MunroeBaity, Regina W, NP  chlordiazePOXIDE (LIBRIUM) 25 MG capsule 50mg  PO TID x 1D, then 25-50mg  PO BID X 1D, then 25-50mg  PO QD X 1D Patient not taking: Reported on 11/26/2017 09/20/17   Gerhard MunchLockwood, Robert, MD  diphenhydrAMINE (BENADRYL) 25 MG tablet Take 1-3 mg by mouth at bedtime as needed for sleep.    [provider]  escitalopram (LEXAPRO) 20 MG tablet Take 1 tablet (20 mg total) by mouth daily. Patient not taking: Reported  on 11/26/2017 09/03/17   Lorre MunroeBaity, Regina W, NP  ibuprofen (ADVIL,MOTRIN) 200 MG tablet Take 400 mg by mouth every 6 (six) hours as needed for headache.    [provider]  Melatonin 5 MG TABS Take 2.5 mg by mouth at bedtime as needed.    [provider]    Physical Exam: Vitals:   11/26/17 1627 11/26/17 1858 11/26/17 2002 11/26/17 2250  BP:  (!) 141/103 (!) 145/112 137/86  Pulse:  (!) 102 (!) 118 90  Resp:  16 16 14   Temp:    98.9 F (37.2 C)  TempSrc:    Oral  SpO2:  93% 92% 92%  Weight: 135.2 kg (298 lb)     Height: 6\' 2"  (1.88 m)      General: Not in acute distress. Pt is mildly tremulous. HEENT:       Eyes: PERRL, EOMI, no scleral icterus.       ENT: No discharge from the ears and nose, no pharynx injection, no tonsillar enlargement.        Neck: No JVD, no bruit, no mass felt. Heme: No neck lymph node enlargement. Cardiac: S1/S2, RRR, No murmurs, No gallops or rubs. Respiratory: No rales, wheezing, rhonchi or rubs. GI: Soft, nondistended, nontender, no rebound pain, no organomegaly, BS present. GU: No hematuria Ext: No pitting leg edema bilaterally. 2+DP/PT pulse bilaterally. Musculoskeletal: No joint deformities, No joint redness or warmth, no limitation of ROM in spin. Skin: No rashes.  Neuro: Alert, oriented X3, cranial nerves II-XII grossly intact, moves all extremities normally.  Psych: Patient is not psychotic, no suicidal or hemocidal ideation.  Labs on Admission: I have personally reviewed following labs and imaging studies  CBC: Recent Labs  Lab 11/26/17 1719  WBC 7.5  NEUTROABS 4.4  HGB 19.0*  HCT 52.2*  MCV 94.2  PLT 169   Basic Metabolic Panel: Recent Labs  Lab 11/26/17 1719  NA 140  K 3.6  CL 101  CO2 25  GLUCOSE 101*  BUN 6  CREATININE 0.83  CALCIUM 9.2   GFR: Estimated Creatinine Clearance: 164.4 mL/min (by C-G formula based on SCr of 0.83 mg/dL). Liver Function Tests: Recent Labs  Lab 11/26/17 1719  AST 133*  ALT  146*  ALKPHOS 78  BILITOT 1.4*  PROT 8.2*  ALBUMIN 4.4   Recent Labs  Lab 11/26/17 1719  LIPASE 32   No results for input(s): AMMONIA in the last 168 hours. Coagulation Profile: Recent Labs  Lab 11/26/17 2307  INR 1.25   Cardiac Enzymes: No results for input(s): CKTOTAL, CKMB, CKMBINDEX, TROPONINI in the last 168 hours. BNP (last 3 results) No results for input(s): PROBNP in the last 8760 hours. HbA1C: No results for input(s): HGBA1C in the last 72 hours. CBG: No results for input(s): GLUCAP in the last 168 hours. Lipid Profile: No results for input(s): CHOL, HDL, LDLCALC, TRIG, CHOLHDL, LDLDIRECT in the last 72 hours. Thyroid Function Tests: No results for input(s): TSH, T4TOTAL, FREET4, T3FREE, THYROIDAB in the  last 72 hours. Anemia Panel: No results for input(s): VITAMINB12, FOLATE, FERRITIN, TIBC, IRON, RETICCTPCT in the last 72 hours. Urine analysis:    Component Value Date/Time   COLORURINE YELLOW 11/26/2017 1637   APPEARANCEUR CLEAR 11/26/2017 1637   LABSPEC 1.009 11/26/2017 1637   PHURINE 6.0 11/26/2017 1637   GLUCOSEU NEGATIVE 11/26/2017 1637   HGBUR LARGE (A) 11/26/2017 1637   BILIRUBINUR NEGATIVE 11/26/2017 1637   KETONESUR NEGATIVE 11/26/2017 1637   PROTEINUR 30 (A) 11/26/2017 1637   NITRITE NEGATIVE 11/26/2017 1637   LEUKOCYTESUR NEGATIVE 11/26/2017 1637   Sepsis Labs: @LABRCNTIP (procalcitonin:4,lacticidven:4) )No results found for this or any previous visit (from the past 240 hour(s)).   Radiological Exams on Admission: No results found.   EKG:   Not done in ED, will get one.   Assessment/Plan Principal Problem:   Alcohol withdrawal (HCC) Active Problems:   Hypertension   Anxiety and depression   GERD (gastroesophageal reflux disease)   Abnormal LFTs   Abdominal pain   Alcohol withdrawal (HCC): alcohol level 275, has auditory hallucination, at risk of developing DVT. No history of seizure.   - Will admit to telemetry as inpt -  Continue CIWA protocol  - start Librium 10 mg 3 times a day - Thiamine & Folate - Zofran for N/V  - IVF: 2.5 L NS and then 125 cc/hr NS  Essential hypertension: -IV Hydralazine prn -Continue home medications: Lisinopril  Anxiety and depression: NO SI or HI. -continue BuSpar  Abnormal LFTs: AST 133, ALT 146, ALP 78, total bilirubin 1.4. Alcohol abuse may have partially contributed, but the ratio of AST/ALT is not completely consistent with alcohol abuse -check hepatitis panel and HIV antibody -avoid Tylenol  Abdominal pain: currently denies abdominal pain. Lipase normal. Patient may have alcoholic gastritis -Pepcid IV -hold Ibuprofen   DVT ppx: SQ Lovenox Code Status: Full code Family Communication: None at bed side. Disposition Plan:  Anticipate discharge back to previous home environment Consults called:  none Admission status:  Inpatient/tele    Date of Service 11/26/2017    Lorretta Harp [facility] Pager [phone]  If 7PM-7AM, please contact night-coverage [email or web address] Password [email or web address] 11/26/2017, 11:51 PM

## 2017-11-27 DIAGNOSIS — I10 Essential (primary) hypertension: Secondary | ICD-10-CM

## 2017-11-27 LAB — GLUCOSE, CAPILLARY: Glucose-Capillary: 92 mg/dL (ref 65–99)

## 2017-11-27 LAB — HIV ANTIBODY (ROUTINE TESTING W REFLEX): HIV Screen 4th Generation wRfx: NONREACTIVE

## 2017-11-27 NOTE — Care Management Note (Signed)
Case Management Note  Patient Details  Name: Nicholas Price MRN: [id number] Date of Birth: 03-Nov-1971  Subjective/Objective:                  etoh withdrawal  Action/Plan: Date: November 27, 2017 Marcelle SmilingRhonda Davis, BSN, [place], [place] [phone] Chart and notes review for patient progress and needs. Will follow for case management and discharge needs. Next review date: [id number]  Expected Discharge Date:  (unknown)               Expected Discharge Plan:  Home/Self Care  In-House Referral:  Clinical Social Work  Discharge planning Services  CM Consult  Post Acute Care Choice:    Choice offered to:     DME Arranged:    DME Agency:     HH Arranged:    HH Agency:     Status of Service:  In process, will continue to follow  If discussed at Long Length of Stay Meetings, dates discussed:    Additional Comments:  Golda AcreDavis, Rhonda Lynn, RN 11/27/2017, 9:29 AM

## 2017-11-27 NOTE — Clinical Social Work Note (Signed)
Clinical Social Work Assessment  Patient Details  Name: Nicholas Price MRN: [id number] Date of Birth: 12-Mar-1971  Date of referral:  11/27/17               Reason for consult:  Housing Concerns/Homelessness                Permission sought to share information with:  Case Freight forwarder, Chartered certified accountant granted to share information::     Name::        Agency::     Relationship::     Contact Information:     Housing/Transportation Living arrangements for the past 2 months:  Single Family Home Source of Information:  Patient Patient Interpreter Needed:  None Criminal Activity/Legal Involvement Pertinent to Current Situation/Hospitalization:  No - Comment as needed Significant Relationships:  Parents, Other Family Members Lives with:  Parents Do you feel safe going back to the place where you live?  Yes Need for family participation in patient care:  Yes (Comment)  Care giving concerns:  Patient recently kicked out of his home by his wife. Patient has been living with his mother a few days before hospital admission. Patient is concerned he may not have a place to go back to at [place].    Social Worker assessment / plan:  LCSW consulted for [employer].   Patient was admitted to the hospital for intoxication and medical clearance.   Patient is a 46 y.o. male with medical history significant of hypertension, GERD, depression, alcohol abuse.  LCSW met with patient at bedside. No family present at the time of assessment.  Patient reports that he was recently kicked out of his home by his wife for his alcohol use. He is currently living with his mother. He has concerns that she may not allow him back after his recent intoxication and hospital stay.    Patient reports that his family is local including father, grandparents and sibling. He reports that all are supportive of him and knowledgeable of his past substance abuse history.   Patient currently works full  time and attends [employer]. He has noticed that some peers in his AA group have been successful in [place] and he is interested in that option.   PLAN: LCSW provided patient with a list of local [employer]. Patient will follow up with resources.   LCSW signing off. No further CSW needs at this time.     Employment status:  [employer] information:  Managed Care PT Recommendations:  Not assessed at this time Information / Referral to community resources:  Other (Comment Required)([facility])  Patient/Family's Response to care:  Patient was appreciative of LCSW visit. LCSW answered patient questions.   Patient/Family's Understanding of and Emotional Response to Diagnosis, Current Treatment, and Prognosis:  Patient is understanding of his current diagnosis and is agreeable to current treatment plan. Patient reports that he is aware that he needs a higher level of support than he is currently receiving from weekly AA meetings.   Emotional Assessment Appearance:  Appears stated age Attitude/Demeanor/Rapport:    Affect (typically observed):  Pleasant, Quiet, Calm Orientation:  Oriented to Self, Oriented to Place, Oriented to  Time, Oriented to Situation Alcohol / Substance use:  Alcohol Use Psych involvement (Current and /or in the community):  No (Comment)  Discharge Needs  Concerns to be addressed:  No discharge needs identified Readmission within the last 30 days:    Current discharge risk:  Substance Abuse Barriers to  Discharge:  Continued Medical Work up   [employer], LCSW 11/27/2017, 9:53 AM

## 2017-11-27 NOTE — Progress Notes (Signed)
PROGRESS NOTE    Nicholas Price  [id number] DOB: March 25, 1971 DOA: 11/26/2017 PCP: Lorre MunroeBaity, Regina W, NP   Brief Narrative:   46 year old male with history of hypertension, alcohol abuse, depression, GERD comes to the hospital for evaluation of alcohol intoxication.  Patient was admitted as he was showing some signs of alcohol withdrawal including tremors and hallucinations.  He has been drinking alcohol for over 20 years and has tried to quit couple of times but relapsed soon after.  Denies any stress causing a relapse but thinks if he starts drinking he can only drink little amount but it always turns into abuse according to him.  Assessment & Plan:   Principal Problem:   Alcohol withdrawal (HCC) Active Problems:   Hypertension   Anxiety and depression   GERD (gastroesophageal reflux disease)   Abnormal LFTs   Abdominal pain  Alcohol abuse Active alcohol withdrawal with some auditory hallucinations -Currently patient is on CIWA protocol -Continue Librium 3 times daily along with Ativan per CIWAl protocol as needed -Continue thiamine folate, anti-emetics as needed - IV fluids, diet as tolerated, PPI/Pepcid as needed  Essential hypertension -Continue home medication lisinopril, IV hydralazine as needed  Transaminitis -They appear to be somewhat stable at this time, continue to trend - HIV and hepatitis panel sent-pending at this time  History of depression -Continue BuSpar 10 mg twice daily    DVT prophylaxis: Lovenox Code Status: Full code Family Communication: None at bedside Disposition Plan: Likely discharge in next 24-48 hours once he is out of high risk delirium tremens window.    Consultants:   None  Procedures:   None  Antimicrobials:   None   Subjective: States he feels a little better after getting Ativan and Librium.  Reports his auditory hallucinations have improved a little bit.   Objective: Vitals:   11/26/17 1858 11/26/17 2002 11/26/17  2250 11/27/17 0612  BP: (!) 141/103 (!) 145/112 137/86 129/86  Pulse: (!) 102 (!) 118 90 93  Resp: 16 16 14 15   Temp:   98.9 F (37.2 C) 98.3 F (36.8 C)  TempSrc:   Oral Oral  SpO2: 93% 92% 92% 98%  Weight:      Height:        Intake/Output Summary (Last 24 hours) at 11/27/2017 1253 Last data filed at 11/27/2017 1110 Gross per 24 hour  Intake 1456.25 ml  Output 400 ml  Net 1056.25 ml   Filed Weights   11/26/17 1627  Weight: 135.2 kg (298 lb)    Examination:  General exam: Appears calm and comfortable; facial flushing, slight tremors b/l UE Respiratory system: Clear to auscultation. Respiratory effort normal. Cardiovascular system: S1 & S2 heard, RRR. No JVD, murmurs, rubs, gallops or clicks. No pedal edema. Gastrointestinal system: Abdomen is nondistended, soft and nontender. No organomegaly or masses felt. Normal bowel sounds heard. Central nervous system: Alert and oriented. No focal neurological deficits. Extremities: Symmetric 5 x 5 power. Skin: No rashes, lesions or ulcers Psychiatry: Auditory hallucination    Data Reviewed:   CBC: Recent Labs  Lab 11/26/17 1719  WBC 7.5  NEUTROABS 4.4  HGB 19.0*  HCT 52.2*  MCV 94.2  PLT 169   Basic Metabolic Panel: Recent Labs  Lab 11/26/17 1719  NA 140  K 3.6  CL 101  CO2 25  GLUCOSE 101*  BUN 6  CREATININE 0.83  CALCIUM 9.2   GFR: Estimated Creatinine Clearance: 164.4 mL/min (by C-G formula based on SCr of 0.83 mg/dL). Liver Function Tests:  Recent Labs  Lab 11/26/17 1719  AST 133*  ALT 146*  ALKPHOS 78  BILITOT 1.4*  PROT 8.2*  ALBUMIN 4.4   Recent Labs  Lab 11/26/17 1719  LIPASE 32   No results for input(s): AMMONIA in the last 168 hours. Coagulation Profile: Recent Labs  Lab 11/26/17 2307  INR 1.25   Cardiac Enzymes: No results for input(s): CKTOTAL, CKMB, CKMBINDEX, TROPONINI in the last 168 hours. BNP (last 3 results) No results for input(s): PROBNP in the last 8760  hours. HbA1C: No results for input(s): HGBA1C in the last 72 hours. CBG: Recent Labs  Lab 11/27/17 0753  GLUCAP 92   Lipid Profile: No results for input(s): CHOL, HDL, LDLCALC, TRIG, CHOLHDL, LDLDIRECT in the last 72 hours. Thyroid Function Tests: No results for input(s): TSH, T4TOTAL, FREET4, T3FREE, THYROIDAB in the last 72 hours. Anemia Panel: No results for input(s): VITAMINB12, FOLATE, FERRITIN, TIBC, IRON, RETICCTPCT in the last 72 hours. Sepsis Labs: No results for input(s): PROCALCITON, LATICACIDVEN in the last 168 hours.  No results found for this or any previous visit (from the past 240 hour(s)).       Radiology Studies: No results found.      Scheduled Meds: . busPIRone  10 mg Oral BID  . chlordiazePOXIDE  10 mg Oral TID  . enoxaparin (LOVENOX) injection  40 mg Subcutaneous Q24H  . folic acid  1 mg Oral Daily  . lisinopril  20 mg Oral Daily  . LORazepam  0-4 mg Intravenous Q6H   Followed by  . [START ON 11/28/2017] LORazepam  0-4 mg Intravenous Q12H  . multivitamin with minerals  1 tablet Oral Daily  . thiamine  100 mg Oral Daily   Or  . thiamine  100 mg Intravenous Daily   Continuous Infusions: . sodium chloride 125 mL/hr at 11/27/17 0623  . famotidine (PEPCID) IV Stopped (11/27/17 1213)     LOS: 1 day    Time spent: 30 mins    Gusta Marksberry Joline Maxcyhirag Jacquelynn Friend, MD [facility] Pager [phone]   If 7PM-7AM, please contact night-coverage [email or web address] Password [email or web address] 11/27/2017, 12:53 PM

## 2017-11-28 LAB — HEPATITIS PANEL, ACUTE
HEP B C IGM: NEGATIVE
HEP B S AG: NEGATIVE
Hep A IgM: NEGATIVE

## 2017-11-28 LAB — GLUCOSE, CAPILLARY: GLUCOSE-CAPILLARY: 158 mg/dL — AB (ref 65–99)

## 2017-11-28 MED ORDER — LIP MEDEX EX OINT
TOPICAL_OINTMENT | CUTANEOUS | Status: DC | PRN
  Filled 2017-11-28: qty 7

## 2017-11-28 MED ORDER — FAMOTIDINE 20 MG PO TABS
20.0000 mg | ORAL_TABLET | Freq: Two times a day (BID) | ORAL | Status: DC
  Administered 2017-11-28 – 2017-11-29 (×2): 20 mg via ORAL
  Filled 2017-11-28 (×2): qty 1

## 2017-11-28 NOTE — Progress Notes (Signed)
PROGRESS NOTE    Nicholas Price  ZOX:[id number] DOB: 01-Jan-1971 DOA: 11/26/2017 PCP: Lorre MunroeBaity, Regina W, NP (Confirm with patient/family/NH records and if not entered, this HAS to be entered at [facility] point [facility]. "No PCP" if truly none.)   Brief Narrative: (Start on day 1 of progress note - keep it brief and live) 46 year old male with history of hypertension, alcohol abuse, depression, GERD comes to the hospital for evaluation of alcohol intoxication.  Patient was admitted as he was showing some signs of alcohol withdrawal including tremors and hallucinations.  He has been drinking alcohol for over 20 years and has tried to quit couple of times but relapsed soon after.  Denies any stress causing a relapse but thinks if he starts drinking he can only drink little amount but it always turns into abuse according to him.     Assessment & Plan:   Principal Problem:   Alcohol withdrawal (HCC) Active Problems:   Hypertension   Anxiety and depression   GERD (gastroesophageal reflux disease)   Abnormal LFTs   Abdominal pain   Alcohol abuse Active alcohol withdrawal with some auditory hallucinations - Last drink Tuesday around 10, if he continues to do well without scoring or needing supplemental benzo, maybe able to d/c with librium taper tomorrow - Currently patient is on CIWA protocol (last dose of ativan yesterday 12/5 PM with score of 11, since then doing ok) -Continue Librium 3 times daily along with Ativan per CIWAl protocol as needed -Continue thiamine folate, anti-emetics as needed - IV fluids, diet as tolerated, PPI/Pepcid as needed - discussed meds like natrexone and antabuse which he's tried before, but may be worth trying again with PCP  Essential hypertension -Continue home medication lisinopril, IV hydralazine as needed  Transaminitis -They appear to be somewhat stable at this time, continue to trend - HIV and hepatitis panel sent negative  History of  depression -Continue BuSpar 10 mg twice daily  DVT prophylaxis: lovenox Code Status: full  Family Communication: none at bedside Disposition Plan: pending improvement, hopefully if does well over next 24 hours   Consultants:   none  Procedures: (Don't include imaging studies which can be auto populated. Include things that cannot be auto populated i.e. Echo, Carotid and venous dopplers, Foley, Bipap, HD, tubes/drains, wound vac, central lines etc)  none  Antimicrobials: (specify start and planned stop date. Auto populated tables are space occupying and do not give end dates)  none    Subjective: Feeling ok.   No complaints. No hx seizures, but was having auditory hallucinations.  These have stopped. No hx he know of DT/s  Objective: Vitals:   11/27/17 2128 11/28/17 0653 11/28/17 1507 11/28/17 2033  BP: (!) 155/102 (!) 155/102 (!) 149/99 (!) 179/112  Pulse: 71 98 88 93  Resp: 16 16 16 18   Temp: 98.5 F (36.9 C) 98.5 F (36.9 C) 98.4 F (36.9 C) 98.5 F (36.9 C)  TempSrc: Oral Oral Oral Oral  SpO2: 95% 100% 97% 100%  Weight:      Height:        Intake/Output Summary (Last 24 hours) at 11/28/2017 2120 Last data filed at 11/28/2017 1508 Gross per 24 hour  Intake 480 ml  Output -  Net 480 ml   Filed Weights   11/26/17 1627  Weight: 135.2 kg (298 lb)    Examination:  General exam: Appears calm and comfortable  Respiratory system: Clear to auscultation. Respiratory effort normal. Cardiovascular system: S1 & S2 heard, RRR. No  JVD, murmurs, rubs, gallops or clicks. No pedal edema. Gastrointestinal system: Abdomen is nondistended, soft and nontender. No organomegaly or masses felt. Normal bowel sounds heard. Central nervous system: Alert and oriented. No focal neurological deficits.  Mild tremor Extremities: Symmetric 5 x 5 power. Skin: No rashes, lesions or ulcers Psychiatry: Judgement and insight appear normal. Mood & affect appropriate.     Data Reviewed:  I have personally reviewed following labs and imaging studies  CBC: Recent Labs  Lab 11/26/17 1719  WBC 7.5  NEUTROABS 4.4  HGB 19.0*  HCT 52.2*  MCV 94.2  PLT 169   Basic Metabolic Panel: Recent Labs  Lab 11/26/17 1719  NA 140  K 3.6  CL 101  CO2 25  GLUCOSE 101*  BUN 6  CREATININE 0.83  CALCIUM 9.2   GFR: Estimated Creatinine Clearance: 164.4 mL/min (by C-G formula based on SCr of 0.83 mg/dL). Liver Function Tests: Recent Labs  Lab 11/26/17 1719  AST 133*  ALT 146*  ALKPHOS 78  BILITOT 1.4*  PROT 8.2*  ALBUMIN 4.4   Recent Labs  Lab 11/26/17 1719  LIPASE 32   No results for input(s): AMMONIA in the last 168 hours. Coagulation Profile: Recent Labs  Lab 11/26/17 2307  INR 1.25   Cardiac Enzymes: No results for input(s): CKTOTAL, CKMB, CKMBINDEX, TROPONINI in the last 168 hours. BNP (last 3 results) No results for input(s): PROBNP in the last 8760 hours. HbA1C: No results for input(s): HGBA1C in the last 72 hours. CBG: Recent Labs  Lab 11/27/17 0753 11/28/17 0723  GLUCAP 92 158*   Lipid Profile: No results for input(s): CHOL, HDL, LDLCALC, TRIG, CHOLHDL, LDLDIRECT in the last 72 hours. Thyroid Function Tests: No results for input(s): TSH, T4TOTAL, FREET4, T3FREE, THYROIDAB in the last 72 hours. Anemia Panel: No results for input(s): VITAMINB12, FOLATE, FERRITIN, TIBC, IRON, RETICCTPCT in the last 72 hours. Sepsis Labs: No results for input(s): PROCALCITON, LATICACIDVEN in the last 168 hours.  No results found for this or any previous visit (from the past 240 hour(s)).       Radiology Studies: No results found.      Scheduled Meds: . busPIRone  10 mg Oral BID  . chlordiazePOXIDE  10 mg Oral TID  . enoxaparin (LOVENOX) injection  40 mg Subcutaneous Q24H  . famotidine  20 mg Oral BID  . folic acid  1 mg Oral Daily  . lisinopril  20 mg Oral Daily  . LORazepam  0-4 mg Intravenous Q6H   Followed by  . LORazepam  0-4 mg  Intravenous Q12H  . multivitamin with minerals  1 tablet Oral Daily  . thiamine  100 mg Oral Daily   Or  . thiamine  100 mg Intravenous Daily   Continuous Infusions: . sodium chloride 125 mL/hr at 11/28/17 0611     LOS: 2 days    Time spent: over 30 min    Lacretia Nicksaldwell Powell, MD [facility] Pager [phone]   If 7PM-7AM, please contact night-coverage [email or web address] Password [email or web address] 11/28/2017, 9:20 PM

## 2017-11-29 ENCOUNTER — Other Ambulatory Visit: Payer: Self-pay

## 2017-11-29 LAB — COMPREHENSIVE METABOLIC PANEL
ALT: 99 U/L — AB (ref 17–63)
AST: 82 U/L — ABNORMAL HIGH (ref 15–41)
Albumin: 3.7 g/dL (ref 3.5–5.0)
Alkaline Phosphatase: 67 U/L (ref 38–126)
Anion gap: 9 (ref 5–15)
BUN: 8 mg/dL (ref 6–20)
CHLORIDE: 105 mmol/L (ref 101–111)
CO2: 23 mmol/L (ref 22–32)
Calcium: 9.1 mg/dL (ref 8.9–10.3)
Creatinine, Ser: 0.61 mg/dL (ref 0.61–1.24)
Glucose, Bld: 93 mg/dL (ref 65–99)
POTASSIUM: 3.9 mmol/L (ref 3.5–5.1)
SODIUM: 137 mmol/L (ref 135–145)
Total Bilirubin: 1.7 mg/dL — ABNORMAL HIGH (ref 0.3–1.2)
Total Protein: 6.7 g/dL (ref 6.5–8.1)

## 2017-11-29 LAB — CBC
HCT: 45.5 % (ref 39.0–52.0)
Hemoglobin: 16.3 g/dL (ref 13.0–17.0)
MCH: 33.7 pg (ref 26.0–34.0)
MCHC: 35.8 g/dL (ref 30.0–36.0)
MCV: 94.2 fL (ref 78.0–100.0)
PLATELETS: 78 10*3/uL — AB (ref 150–400)
RBC: 4.83 MIL/uL (ref 4.22–5.81)
RDW: 12 % (ref 11.5–15.5)
WBC: 3.6 10*3/uL — AB (ref 4.0–10.5)

## 2017-11-29 LAB — GLUCOSE, CAPILLARY: GLUCOSE-CAPILLARY: 113 mg/dL — AB (ref 65–99)

## 2017-11-29 MED ORDER — THIAMINE HCL 100 MG PO TABS: 100.0000 mg | ORAL_TABLET | Freq: Every day | ORAL | 0 refills | Status: DC

## 2017-11-29 MED ORDER — CHLORDIAZEPOXIDE HCL 10 MG PO CAPS: ORAL_CAPSULE | ORAL | 0 refills | Status: DC

## 2017-11-29 MED ORDER — BUSPIRONE HCL 10 MG PO TABS: 10.0000 mg | ORAL_TABLET | Freq: Two times a day (BID) | ORAL | 0 refills | Status: DC

## 2017-11-29 MED ORDER — FOLIC ACID 1 MG PO TABS: 1.0000 mg | ORAL_TABLET | Freq: Every day | ORAL | 0 refills | Status: DC

## 2017-11-29 MED ORDER — LISINOPRIL 20 MG PO TABS: 20.0000 mg | ORAL_TABLET | Freq: Every day | ORAL | 0 refills | Status: DC

## 2017-11-29 NOTE — Discharge Summary (Addendum)
Discharge Summary  Nicholas Price [id number] DOB: 1971/07/17  PCP: Lorre MunroeBaity, Regina W, NP  Admit date: 11/26/2017 Discharge date: 11/29/2017  Time spent: <9630mins  Recommendations for Outpatient Follow-up:  1. F/u with PMD within a week  for hospital discharge follow up, repeat cbc/cmp at follow up. pmd to release patient back to work.    Discharge Diagnoses:  Active Hospital Problems   Diagnosis Date Noted  . Alcohol withdrawal (HCC) 11/26/2017  . GERD (gastroesophageal reflux disease) 11/26/2017  . Abnormal LFTs 11/26/2017  . Abdominal pain 11/26/2017  . Anxiety and depression   . Hypertension 07/10/2012    Resolved Hospital Problems  No resolved problems to display.    Discharge Condition: stable  Diet recommendation: heart healthy  Filed Weights   11/26/17 1627  Weight: 135.2 kg (298 lb)    History of present illness:  PCP: Lorre MunroeBaity, Regina W, NP   Patient coming from:  The patient is coming from home.  At baseline, pt is independent for most of ADL.   Chief Complaint: alcohol intoxication and medical clearance,   HPI: Nicholas Sensingric Howze is a 46 y.o. male with medical history significant of hypertension, GERD, depression, alcohol abuse, who presents with alcohol intoxication and medical clearance.  [facility]. He was sent to ED for medical clearance. Pt states that over the last 3 days he has drank around 1/2 gallon of vodka a day. He states that he wants to quit drinking, but not sure that he is strong enough to stop right now. He has auditory of hallucination, "hear bat wing flapping". Patient denies suicidal or homicidal ideations. Pt was seen at behavioral health and sent here for right upper quadrant abdominal pain.  Obviously [facility], but he denies any abdominal pain to me now, even after I repeatedly asked whether he has any abdominal pain. He also denies nausea, vomiting, diarrhea, symptoms of UTI. No  unilateral weakness.  ED Course: pt was found to have alcohol level 275, WBC 7.5, negative UDS, lipase is 32, negative urinalysis, electrolytes renal function okay, tachycardia, no tachypnea, oxygen saturation 92% on room air, temp 99.4. Patient is admitted to telemetry bed as inpatient    Hospital Course:  Principal Problem:   Alcohol withdrawal (HCC) Active Problems:   Hypertension   Anxiety and depression   GERD (gastroesophageal reflux disease)   Abnormal LFTs   Abdominal pain   Alcohol abuse Active alcohol withdrawal with some auditory hallucinations - Last drink Tuesday around 10, he is started on ciwa protocol with librium and prn ativan -he received ivf,  Thiamine, folate,  -he improved, last dose of ativan yesterday 12/5 PM with score of 11, since then doing ok,  - d/c with librium taper  - discussed meds like natrexone and antabuse which he's tried before, but may be worth trying again with PCP  Essential hypertension -Continue home medication lisinopril, IV hydralazine as needed  Transaminitis -improving, pmd follow up and repeat labs - HIV and hepatitis panel sent negative  Thrombocytopenia:  likely from alcohol Encourage Alcohol abstinence pmd follow up to repeat labs  History of depression -Continue BuSpar 10 mg twice daily  DVT prophylaxis while in the hospital: lovenox Code Status: full  Family Communication: none at bedside Disposition Plan: d/c home   Consultants:   none   Procedures:  none  Consultations:  none  Discharge Exam: BP (!) 171/109 (BP Location: Left Leg)   Pulse 99   Temp  97.9 F (36.6 C) (Oral)   Resp 20   Ht 6\' 2"  (1.88 m)   Wt 135.2 kg (298 lb)   SpO2 99%   BMI 38.26 kg/m   General: NAD Cardiovascular: RRR Respiratory: CTABL  Discharge Instructions You were cared for by a hospitalist during your hospital stay. If you have any questions about your discharge medications or the care you received while  you were in the hospital after you are discharged, you can call the unit and asked to speak with the hospitalist on call if the hospitalist that took care of you is not available. Once you are discharged, your primary care physician will handle any further medical issues. Please note that NO REFILLS for any discharge medications will be authorized once you are discharged, as it is imperative that you return to your primary care physician (or establish a relationship with a primary care physician if you do not have one) for your aftercare needs so that they can reassess your need for medications and monitor your lab values.  Discharge Instructions    Diet - low sodium heart healthy   Complete by:  As directed    Discharge instructions   Complete by:  As directed    Please consider stop drinking alcohol.   Increase activity slowly   Complete by:  As directed      Allergies as of 11/29/2017   No Known Allergies     Medication List    STOP taking these medications   escitalopram 20 MG tablet Commonly known as:  LEXAPRO     TAKE these medications   busPIRone 10 MG tablet Commonly known as:  BUSPAR Take 1 tablet (10 mg total) by mouth 2 (two) times daily.   chlordiazePOXIDE 10 MG capsule Commonly known as:  LIBRIUM Take 10mg  twice a day on 12/8, take 10mg  once a day on 12/9, take 5mg  once a day on 12/10, then stop. Do not drive while taking librium What changed:    medication strength  additional instructions   diphenhydrAMINE 25 MG tablet Commonly known as:  BENADRYL Take 1-3 mg by mouth at bedtime as needed for sleep.   folic acid 1 MG tablet Commonly known as:  FOLVITE Take 1 tablet (1 mg total) by mouth daily. Start taking on:  11/30/2017   ibuprofen 200 MG tablet Commonly known as:  ADVIL,MOTRIN Take 400 mg by mouth every 6 (six) hours as needed for headache.   lisinopril 20 MG tablet Commonly known as:  PRINIVIL,ZESTRIL Take 1 tablet (20 mg total) by mouth at  bedtime.   Melatonin 5 MG Tabs Take 2.5 mg by mouth at bedtime as needed.   multivitamin with minerals Tabs tablet Take 1 tablet by mouth daily.   thiamine 100 MG tablet Take 1 tablet (100 mg total) by mouth daily. Start taking on:  11/30/2017   traZODone 50 MG tablet Commonly known as:  DESYREL Take 0.5-1 tablets (25-50 mg total) by mouth at bedtime as needed for sleep. What changed:    how much to take  when to take this      No Known Allergies Follow-up Information    Lorre MunroeBaity, Regina W, NP Follow up on 12/02/2017.   Specialty:  Internal Medicine Why:  hospital discharge follow up, repeat cbc/cmp at follow up. pmd to release you go back to work after hospital discharge follow up. Contact information: [address] [place] [address] [phone]  The results of significant diagnostics from this hospitalization (including imaging, microbiology, ancillary and laboratory) are listed below for reference.    Significant Diagnostic Studies: No results found.  Microbiology: No results found for this or any previous visit (from the past 240 hour(s)).   Labs: Basic Metabolic Panel: Recent Labs  Lab 11/26/17 1719 11/29/17 0534  NA 140 137  K 3.6 3.9  CL 101 105  CO2 25 23  GLUCOSE 101* 93  BUN 6 8  CREATININE 0.83 0.61  CALCIUM 9.2 9.1   Liver Function Tests: Recent Labs  Lab 11/26/17 1719 11/29/17 0534  AST 133* 82*  ALT 146* 99*  ALKPHOS 78 67  BILITOT 1.4* 1.7*  PROT 8.2* 6.7  ALBUMIN 4.4 3.7   Recent Labs  Lab 11/26/17 1719  LIPASE 32   No results for input(s): AMMONIA in the last 168 hours. CBC: Recent Labs  Lab 11/26/17 1719 11/29/17 0534  WBC 7.5 3.6*  NEUTROABS 4.4  --   HGB 19.0* 16.3  HCT 52.2* 45.5  MCV 94.2 94.2  PLT 169 78*   Cardiac Enzymes: No results for input(s): CKTOTAL, CKMB, CKMBINDEX, TROPONINI in the last 168 hours. BNP: BNP (last 3 results) No results for input(s): BNP in the last 8760  hours.  ProBNP (last 3 results) No results for input(s): PROBNP in the last 8760 hours.  CBG: Recent Labs  Lab 11/27/17 0753 11/28/17 0723 11/29/17 0746  GLUCAP 92 158* 113*       Signed:  Albertine Grates MD, PhD  [facility] 11/29/2017, 11:00 AM

## 2017-11-29 NOTE — Progress Notes (Signed)
IV removed, DC instructions reviewed with patient. Patient expresses understanding. Paper prescriptions provided. Patient escorted to main entrance where he will call a [employer].

## 2017-12-04 ENCOUNTER — Telehealth: Payer: Self-pay | Admitting: *Deleted

## 2017-12-04 NOTE — Telephone Encounter (Signed)
Lm requesting return call to complete TCM and confirm hosp f/u appt  

## 2017-12-10 ENCOUNTER — Encounter (HOSPITAL_COMMUNITY): Payer: Self-pay | Admitting: Emergency Medicine

## 2017-12-10 ENCOUNTER — Emergency Department (HOSPITAL_COMMUNITY)
Admission: EM | Admit: 2017-12-10 | Discharge: 2017-12-11 | Disposition: A | Discharge status: Home / Self Care | Payer: Self-pay | Attending: Emergency Medicine | Admitting: Emergency Medicine

## 2017-12-10 DIAGNOSIS — Y908 Blood alcohol level of 240 mg/100 ml or more: Secondary | ICD-10-CM | POA: Insufficient documentation

## 2017-12-10 DIAGNOSIS — Z79899 Other long term (current) drug therapy: Secondary | ICD-10-CM | POA: Insufficient documentation

## 2017-12-10 DIAGNOSIS — F1010 Alcohol abuse, uncomplicated: Secondary | ICD-10-CM

## 2017-12-10 DIAGNOSIS — I10 Essential (primary) hypertension: Secondary | ICD-10-CM | POA: Insufficient documentation

## 2017-12-10 DIAGNOSIS — F1014 Alcohol abuse with alcohol-induced mood disorder: Secondary | ICD-10-CM

## 2017-12-10 DIAGNOSIS — F10230 Alcohol dependence with withdrawal, uncomplicated: Secondary | ICD-10-CM | POA: Insufficient documentation

## 2017-12-10 DIAGNOSIS — F10930 Alcohol use, unspecified with withdrawal, uncomplicated: Secondary | ICD-10-CM

## 2017-12-10 LAB — COMPREHENSIVE METABOLIC PANEL
ALBUMIN: 3.9 g/dL (ref 3.5–5.0)
ALK PHOS: 104 U/L (ref 38–126)
ALT: 172 U/L — ABNORMAL HIGH (ref 17–63)
ANION GAP: 10 (ref 5–15)
AST: 168 U/L — ABNORMAL HIGH (ref 15–41)
BUN: 9 mg/dL (ref 6–20)
CHLORIDE: 108 mmol/L (ref 101–111)
CO2: 24 mmol/L (ref 22–32)
Calcium: 8.6 mg/dL — ABNORMAL LOW (ref 8.9–10.3)
Creatinine, Ser: 0.75 mg/dL (ref 0.61–1.24)
GFR calc non Af Amer: >60 mL/min (ref >60)
GLUCOSE: 131 mg/dL — AB (ref 65–99)
Potassium: 3.9 mmol/L (ref 3.5–5.1)
SODIUM: 142 mmol/L (ref 135–145)
Total Bilirubin: 0.6 mg/dL (ref 0.3–1.2)
Total Protein: 7.6 g/dL (ref 6.5–8.1)

## 2017-12-10 LAB — RAPID URINE DRUG SCREEN, HOSP PERFORMED
AMPHETAMINES: NOT DETECTED
BARBITURATES: NOT DETECTED
BENZODIAZEPINES: POSITIVE — AB
Cocaine: NOT DETECTED
Opiates: NOT DETECTED
TETRAHYDROCANNABINOL: NOT DETECTED

## 2017-12-10 LAB — CBC
HEMATOCRIT: 47.3 % (ref 39.0–52.0)
HEMOGLOBIN: 17 g/dL (ref 13.0–17.0)
MCH: 34.6 pg — AB (ref 26.0–34.0)
MCHC: 35.9 g/dL (ref 30.0–36.0)
MCV: 96.3 fL (ref 78.0–100.0)
Platelets: 125 10*3/uL — ABNORMAL LOW (ref 150–400)
RBC: 4.91 MIL/uL (ref 4.22–5.81)
RDW: 13.2 % (ref 11.5–15.5)
WBC: 6 10*3/uL (ref 4.0–10.5)

## 2017-12-10 LAB — LIPASE, BLOOD: LIPASE: 43 U/L (ref 11–51)

## 2017-12-10 LAB — ETHANOL: Alcohol, Ethyl (B): 295 mg/dL — ABNORMAL HIGH (ref <10)

## 2017-12-10 MED ORDER — ALUM & MAG HYDROXIDE-SIMETH 200-200-20 MG/5ML PO SUSP: 30.0000 mL | Freq: Four times a day (QID) | ORAL | Status: DC | PRN

## 2017-12-10 MED ORDER — LORAZEPAM 2 MG/ML IJ SOLN: 0.0000 mg | Freq: Two times a day (BID) | INTRAMUSCULAR | Status: DC

## 2017-12-10 MED ORDER — THIAMINE HCL 100 MG/ML IJ SOLN: 100.0000 mg | Freq: Every day | INTRAMUSCULAR | Status: DC

## 2017-12-10 MED ORDER — FOLIC ACID 1 MG PO TABS
1.0000 mg | ORAL_TABLET | Freq: Once | ORAL | Status: AC
  Administered 2017-12-10: 1 mg via ORAL
  Filled 2017-12-10: qty 1

## 2017-12-10 MED ORDER — TRAZODONE HCL 50 MG PO TABS: 25.0000 mg | ORAL_TABLET | Freq: Every evening | ORAL | Status: DC | PRN

## 2017-12-10 MED ORDER — ZOLPIDEM TARTRATE 5 MG PO TABS: 5.0000 mg | ORAL_TABLET | Freq: Every evening | ORAL | Status: DC | PRN

## 2017-12-10 MED ORDER — NICOTINE 21 MG/24HR TD PT24
21.0000 mg | MEDICATED_PATCH | Freq: Every day | TRANSDERMAL | Status: DC
  Administered 2017-12-10 – 2017-12-11 (×2): 21 mg via TRANSDERMAL
  Filled 2017-12-10 (×2): qty 1

## 2017-12-10 MED ORDER — LORAZEPAM 2 MG/ML IJ SOLN
0.0000 mg | Freq: Four times a day (QID) | INTRAMUSCULAR | Status: DC
  Administered 2017-12-10 – 2017-12-11 (×3): 2 mg via INTRAVENOUS
  Filled 2017-12-10 (×3): qty 1

## 2017-12-10 MED ORDER — ONDANSETRON HCL 4 MG PO TABS: 4.0000 mg | ORAL_TABLET | Freq: Three times a day (TID) | ORAL | Status: DC | PRN

## 2017-12-10 MED ORDER — SODIUM CHLORIDE 0.9 % IV BOLUS (SEPSIS)
1000.0000 mL | Freq: Once | INTRAVENOUS | Status: AC
  Administered 2017-12-10: 1000 mL via INTRAVENOUS

## 2017-12-10 MED ORDER — VITAMIN B-1 100 MG PO TABS
100.0000 mg | ORAL_TABLET | Freq: Every day | ORAL | Status: DC
  Administered 2017-12-10 – 2017-12-11 (×2): 100 mg via ORAL
  Filled 2017-12-10 (×2): qty 1

## 2017-12-10 MED ORDER — LORAZEPAM 1 MG PO TABS
0.0000 mg | ORAL_TABLET | Freq: Four times a day (QID) | ORAL | Status: DC
  Administered 2017-12-11: 1 mg via ORAL
  Filled 2017-12-10: qty 1

## 2017-12-10 MED ORDER — MELATONIN 5 MG PO TABS: 2.5000 mg | ORAL_TABLET | Freq: Every evening | ORAL | Status: DC | PRN

## 2017-12-10 MED ORDER — LISINOPRIL 20 MG PO TABS
20.0000 mg | ORAL_TABLET | Freq: Every day | ORAL | Status: DC
  Administered 2017-12-10: 20 mg via ORAL
  Filled 2017-12-10 (×2): qty 1

## 2017-12-10 MED ORDER — LORAZEPAM 1 MG PO TABS: 0.0000 mg | ORAL_TABLET | Freq: Two times a day (BID) | ORAL | Status: DC

## 2017-12-10 NOTE — ED Provider Notes (Addendum)
[facility]   CSN: [id number] Arrival date & time: 12/10/17  1257     History   Chief Complaint Chief Complaint  Patient presents with  . Detox    HPI   Blood pressure 117/82, pulse (!) 124, temperature 98 F (36.7 C), temperature source Oral, resp. rate 16, SpO2 96 %.  Nicholas Price is a 46 y.o. male ED with request for alcohol detox.  States he last drank wine approximately 3 hours ago.  He is never had any seizures from alcohol withdrawal but he does have auditory hallucinations.  Was admitted for medical detox less than 3 weeks ago.  He normally drinks half a gallon of vodka per day.  He states that there is nothing in particular that is causing him to want detox at this point other than he cannot continue like this.  He does not use any other drugs, he denies any suicidal ideation homicidal auditory or visual hallucinations active at this time.  Denies any pain including chest pain or shortness of breath.  Past Medical History:  Diagnosis Date  . Alcohol abuse   . Depression   . Hypertension     Patient Active Problem List   Diagnosis Date Noted  . Alcohol abuse 11/26/2017  . Alcohol abuse with intoxication (HCC) 11/26/2017  . Alcohol withdrawal (HCC) 11/26/2017  . GERD (gastroesophageal reflux disease) 11/26/2017  . Abnormal LFTs 11/26/2017  . Abdominal pain 11/26/2017  . Insomnia 07/07/2015  . Anxiety and depression   . Hypertension 07/10/2012    History reviewed. No pertinent surgical history.     Home Medications    Prior to Admission medications   Medication Sig Start Date End Date Taking? Authorizing Provider  diphenhydrAMINE (BENADRYL) 25 MG tablet Take 1-3 mg by mouth at bedtime as needed for sleep.    [provider]  folic acid (FOLVITE) 1 MG tablet Take 1 tablet (1 mg total) by mouth daily. 11/30/17   Albertine Grates, MD  ibuprofen (ADVIL,MOTRIN) 200 MG tablet Take 400 mg by mouth every 6 (six)  hours as needed for headache.    [provider]  lisinopril (PRINIVIL,ZESTRIL) 20 MG tablet Take 1 tablet (20 mg total) by mouth at bedtime. 11/29/17   Albertine Grates, MD  Melatonin 5 MG TABS Take 2.5 mg by mouth at bedtime as needed.    [provider]  Multiple Vitamin (MULTIVITAMIN WITH MINERALS) TABS tablet Take 1 tablet by mouth daily.     [provider]  thiamine 100 MG tablet Take 1 tablet (100 mg total) by mouth daily. 11/30/17   Albertine Grates, MD  traZODone (DESYREL) 50 MG tablet Take 0.5-1 tablets (25-50 mg total) by mouth at bedtime as needed for sleep. Patient taking differently: Take 50 mg by mouth at bedtime.  09/03/17   Lorre Munroe, NP    Family History Family History  Problem Relation Age of Onset  . Anxiety disorder Brother   . Cancer Neg Hx   . Diabetes Neg Hx   . Stroke Neg Hx   . Heart disease Neg Hx     Social History Social History   Tobacco Use  . Smoking status: Former Smoker    Types: Cigarettes  . Smokeless tobacco: Never Used  . Tobacco comment: quit 2013  Substance Use Topics  . Alcohol use: Yes    Alcohol/week: 0.0 oz    Comment: Pt states he drinks about 1 beer to a 1/5th of liquor per  day,  and his last drink  last nite at 2am  . Drug use: No     Allergies   Patient has no known allergies.   Review of Systems Review of Systems  A complete review of systems was obtained and all systems are negative except as noted in the HPI and PMH.   Physical Exam Updated Vital Signs BP 117/82 (BP Location: Left Arm)   Pulse (!) 124   Temp 98 F (36.7 C) (Oral)   Resp 16   SpO2 96%   Physical Exam  Constitutional: He is oriented to person, place, and time. He appears well-developed and well-nourished. No distress.  HENT:  Head: Normocephalic and atraumatic.  Mouth/Throat: Oropharynx is clear and moist.  Eyes: Conjunctivae and EOM are normal. Pupils are equal, round, and reactive to light.  Neck: Normal range of motion.    Cardiovascular: Regular rhythm and intact distal pulses.  Mild tachycardia  Pulmonary/Chest: Effort normal and breath sounds normal.  Abdominal: Soft. There is no tenderness.  Musculoskeletal: Normal range of motion.  Neurological: He is alert and oriented to person, place, and time.  No tongue fasciculations or hand tremors  Skin: He is not diaphoretic.  Psychiatric: He has a normal mood and affect.  Nursing note and vitals reviewed.    ED Treatments / Results  Labs (all labs ordered are listed, but only abnormal results are displayed) Labs Reviewed  COMPREHENSIVE METABOLIC PANEL - Abnormal; Notable for the following components:      Result Value   Glucose, Bld 131 (*)    Calcium 8.6 (*)    AST 168 (*)    ALT 172 (*)    All other components within normal limits  ETHANOL - Abnormal; Notable for the following components:   Alcohol, Ethyl (B) 295 (*)    All other components within normal limits  CBC - Abnormal; Notable for the following components:   MCH 34.6 (*)    Platelets 125 (*)    All other components within normal limits  LIPASE, BLOOD  RAPID URINE DRUG SCREEN, HOSP PERFORMED    EKG  EKG Interpretation None       Radiology No results found.  Procedures Procedures (including critical care time)  Medications Ordered in ED Medications  nicotine (NICODERM CQ - dosed in mg/24 hours) patch 21 mg (not administered)  alum & mag hydroxide-simeth (MAALOX/MYLANTA) 200-200-20 MG/5ML suspension 30 mL (not administered)  ondansetron (ZOFRAN) tablet 4 mg (not administered)  zolpidem (AMBIEN) tablet 5 mg (not administered)  LORazepam (ATIVAN) injection 0-4 mg (not administered)    Or  LORazepam (ATIVAN) tablet 0-4 mg (not administered)  LORazepam (ATIVAN) injection 0-4 mg (not administered)    Or  LORazepam (ATIVAN) tablet 0-4 mg (not administered)  thiamine (VITAMIN B-1) tablet 100 mg (not administered)    Or  thiamine (B-1) injection 100 mg (not administered)   folic acid (FOLVITE) tablet 1 mg (not administered)  lisinopril (PRINIVIL,ZESTRIL) tablet 20 mg (not administered)  Melatonin TABS 2.5 mg (not administered)  traZODone (DESYREL) tablet 25-50 mg (not administered)     Initial Impression / Assessment and Plan / ED Course  I have reviewed the triage vital signs and the nursing notes.  Pertinent labs & imaging results that were available during my care of the patient were reviewed by me and considered in my medical decision making (see chart for details).     Vitals:   12/10/17 1309  BP: 117/82  Pulse: (!) 124  Resp: 16  Temp: 98 F (36.7 C)  TempSrc: Oral  SpO2: 96%    Medications  nicotine (NICODERM CQ - dosed in mg/24 hours) patch 21 mg (not administered)  alum & mag hydroxide-simeth (MAALOX/MYLANTA) 200-200-20 MG/5ML suspension 30 mL (not administered)  ondansetron (ZOFRAN) tablet 4 mg (not administered)  zolpidem (AMBIEN) tablet 5 mg (not administered)  LORazepam (ATIVAN) injection 0-4 mg (not administered)    Or  LORazepam (ATIVAN) tablet 0-4 mg (not administered)  LORazepam (ATIVAN) injection 0-4 mg (not administered)    Or  LORazepam (ATIVAN) tablet 0-4 mg (not administered)  thiamine (VITAMIN B-1) tablet 100 mg (not administered)    Or  thiamine (B-1) injection 100 mg (not administered)  folic acid (FOLVITE) tablet 1 mg (not administered)  lisinopril (PRINIVIL,ZESTRIL) tablet 20 mg (not administered)  Melatonin TABS 2.5 mg (not administered)  traZODone (DESYREL) tablet 25-50 mg (not administered)    Nicholas Price is 46 y.o. male requesting alcohol detox, had a medical detox several weeks ago.  Patient does have DTs with auditory hallucinations, I think that this patient probably needs a supervised detox, no fulminant withdrawal at this time.  Alcohol level 295, mild transaminitis.  Mild tachycardia.  CIWA  protocol initiated and patient can by moved to [facility].   Patient is medically cleared for psychiatric  evaluation will be transferred to the psych ED. TTS consulted, home meds and psych standard holding orders placed.    Final Clinical Impressions(s) / ED Diagnoses   Final diagnoses:  Alcohol withdrawal syndrome without complication [employer](HCC)    ED Discharge Orders    None       Lynetta Mareisciotta, Mardella [place], PA-C 12/10/17 1425    Mancel BaleWentz, Elliott, MD 12/10/17 1553  Informed by TTS that they could not do see what protocol and supervised detox.  This patient with history of auditory hallucinations and recent medical detox may not be appropriate for outpatient Librium taper.  Case signed out to PA up still at shift change: Plan is to recheck vitals, reevaluate the patient and consider medical admission for detox.   Kaylyn Limisciotta, [place], PA-C 12/10/17 1811    Mancel BaleWentz, Elliott, MD 12/11/17 1758

## 2017-12-10 NOTE — Progress Notes (Signed)

## 2017-12-10 NOTE — ED Notes (Signed)
Called for triage x 1 no response

## 2017-12-10 NOTE — BH Assessment (Signed)
[facility] Assessment Progress Note  Case discussed with Donell SievertSpencer Simon, PA who agrees with EDP Elpidio AnisUpstill, Shari, PA-C that pt does not meet inpt criteria. Pt is recommended for a peer support consult in order to assist with being placed in a substance abuse treatment facility.   Princess BruinsAquicha Yarlin Breisch, MSW, LCSW Therapeutic Triage Specialist  [phone]

## 2017-12-10 NOTE — BH Assessment (Addendum)
[facility] Assessment Progress Note  Pt is requesting detox from alcohol. He has denied SI, HI. Endorsed some hallucinations when withdrawing. No active hallucinations reported, at this time. Clinician called Wynetta EmeryNicole Pisciotta, PA-C, requesting provider of TTS consult, to discuss appropriateness or need for consult. After discussion, it was determined that pt is only in need of a medical detox due to his hx of DTs. As such, Joni ReiningNicole, [place], will see about getting pt admitted to medical floor. Peer support offered for assistance but [place]ole, [place] didn't believe they would be of help since a medical detox was needed.   Johny ShockSamantha M. Ladona Ridgelaylor, MS, NCC, LPCA Counselor

## 2017-12-10 NOTE — Progress Notes (Signed)
Per chart, day shift TTS previously discussed with EDP Pisciotta, Joni ReiningNicole, PA-C the need for TTS consult and it was determined (per chart) that pt will need medical detox. Evening EDP Elpidio AnisShari Upstill, PA consulted with current TTS counselor who states the pt is depressed and requesting assistance with SA resources. EDP advised TTS consult needs to be ordered in order for TTS to assess the pt to determine level of care needed. TTS consult ordered at 21:47. EDP has requested the pt receive peer support assistance with being placed in [facility] or another substance abuse treatment center. TTS advised once assessment has been completed, the pt will receive a disposition by the current provider. TTS to follow up.   Nicholas Price, MSW, LCSW Therapeutic Triage Specialist  [phone]
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
## 2017-12-10 NOTE — ED Notes (Signed)
Pt comes in with concerns of alcohol detox.  Was admitted last time he was seen here for the same.  Last drink was an hour ago. Typically drinks about a fifth a day but has not drank that much today yet. No hx of seizure with detox but does state he has had auditory hallucinations before.  Denies any physical pain or hx of pancreatitis.

## 2017-12-10 NOTE — ED Provider Notes (Signed)
H/o detox inpatient etoh 300 No hallucinations currently Last etoh 2 hours pta  Fluids?, banana bag Consider admission for detox/DT's based on history  Patient was monitored for several hours. He has not developed any symptoms of DT's - no tremors, tachycardia, hallucinations, nausea or vomiting. Discussed residential treatment centers that manage detox if needed. TTS consult requested with likely referral to care team in the morning for placement. Will continue to monitor for signs/symptoms of DT's. Will continue CIWA. Patient understands and agrees with care plan.   Elpidio AnisUpstill, Tamerra Merkley, PA-C 12/10/17 [id number]    Mancel BaleWentz, Elliott, MD 12/11/17 [id number] [id number]

## 2017-12-11 ENCOUNTER — Other Ambulatory Visit: Payer: Self-pay

## 2017-12-11 DIAGNOSIS — F1014 Alcohol abuse with alcohol-induced mood disorder: Secondary | ICD-10-CM

## 2017-12-11 DIAGNOSIS — R251 Tremor, unspecified: Secondary | ICD-10-CM

## 2017-12-11 DIAGNOSIS — Z87891 Personal history of nicotine dependence: Secondary | ICD-10-CM

## 2017-12-11 DIAGNOSIS — F10129 Alcohol abuse with intoxication, unspecified: Secondary | ICD-10-CM

## 2017-12-11 DIAGNOSIS — Z818 Family history of other mental and behavioral disorders: Secondary | ICD-10-CM

## 2017-12-11 MED ORDER — BUSPIRONE HCL 10 MG PO TABS
10.0000 mg | ORAL_TABLET | Freq: Two times a day (BID) | ORAL | Status: DC
  Administered 2017-12-11: 10 mg via ORAL
  Filled 2017-12-11: qty 1

## 2017-12-11 MED ORDER — ESCITALOPRAM OXALATE 10 MG PO TABS
20.0000 mg | ORAL_TABLET | Freq: Every day | ORAL | Status: DC
  Administered 2017-12-11: 20 mg via ORAL
  Filled 2017-12-11: qty 2

## 2017-12-11 MED ORDER — TRAZODONE HCL 50 MG PO TABS: 50.0000 mg | ORAL_TABLET | Freq: Every day | ORAL | Status: DC

## 2017-12-11 MED ORDER — ZOLPIDEM TARTRATE 5 MG PO TABS: 5.0000 mg | ORAL_TABLET | Freq: Every evening | ORAL | Status: DC | PRN

## 2017-12-11 MED ORDER — CHLORDIAZEPOXIDE HCL 25 MG PO CAPS: ORAL_CAPSULE | ORAL | 0 refills | Status: DC

## 2017-12-11 NOTE — Patient Outreach (Signed)
CPSS made referral to [facility] for detox and residential substance use treatment for alcohol. CPSS will inform the patient if he is accepted and more details regarding his [employer] insurance or any additional private pay for treatment. Patient stated that his second option for substance use treatment/detox [facility] [place].

## 2017-12-11 NOTE — ED Provider Notes (Signed)
Pt seen and examined CIWA is 4  D/w peer support, they are working on placement Will rx librium taper   Lorre NickAllen, Lyberti Thrush, MD 12/11/17 [phone]

## 2017-12-11 NOTE — Consult Note (Addendum)
Nicholas Price   Reason for Price:  Alcohol intoxication and detox Referring Physician:  EDP Patient Identification: Nicholas Price MRN:  [id number] Principal Diagnosis: Alcohol abuse with alcohol-induced mood disorder [facility]) Diagnosis:   Patient Active Problem List   Diagnosis Date Noted  . Alcohol abuse [F10.10] 11/26/2017  . Alcohol abuse with intoxication ([place]) [F10.129] 11/26/2017  . Alcohol withdrawal ([place]) [F10.239] 11/26/2017  . GERD (gastroesophageal reflux disease) [K21.9] 11/26/2017  . Abnormal LFTs [R94.5] 11/26/2017  . Abdominal pain [R10.9] 11/26/2017  . Insomnia [G47.00] 07/07/2015  . Anxiety and depression [F41.9, F32.9]   . Hypertension [I10] 07/10/2012    Total Time spent with patient: 15 minutes  Subjective:   Nicholas Price is a 46 y.o. male patient admitted with alcohol intoxication with BAL of 295.   HPI:   Nicholas Price reports that he would like detox from alcohol. He drinks a 5th of alcohol daily. He reports a history of heavy use. He has been drinking for 3 years but he was sober for 2 years after completing rehab at [facility]. He denies a history of seizures but does report auditory hallucinations during alcohol withdrawal in the past. He denies SI, HI or AVH.   Past Psychiatric History: Alcohol abuse, depression and anxiety.   Risk to Self: Suicidal Ideation: No Suicidal Intent: No Is patient at risk for suicide?: No Suicidal Plan?: No Access to Means: No What has been your use of drugs/alcohol within the last 12 months?: reports to daily alcohol use  Triggers for Past Attempts: None known Intentional Self Injurious Behavior: None Risk to Others: Homicidal Ideation: No Thoughts of Harm to Others: No Current Homicidal Intent: No Current Homicidal Plan: No Access to Homicidal Means: No History of harm to others?: No Assessment of Violence: None Noted Does patient have access to weapons?: No Criminal Charges Pending?: No Does  patient have a court date: No Prior Inpatient Therapy: Prior Inpatient Therapy: Yes Prior Therapy Dates: 2013 Prior Therapy Facilty/Provider(s): [facility] Reason for Treatment: ALCOHOL Prior Outpatient Therapy: Prior Outpatient Therapy: Yes(PER CHART) Prior Therapy Dates: CURRENT Prior Therapy Facilty/Provider(s): [facility] Reason for Treatment: [facility] Does patient have an ACCT team?: No Does patient have Intensive In-House Services?  : No Does patient have [facility] services? : No Does patient have [facility] services?: No  Past Medical History:  Past Medical History:  Diagnosis Date  . Alcohol abuse   . Depression   . Hypertension    History reviewed. No pertinent surgical history. Family History:  Family History  Problem Relation Age of Onset  . Anxiety disorder Brother   . Cancer Neg Hx   . Diabetes Neg Hx   . Stroke Neg Hx   . Heart disease Neg Hx    Family Psychiatric  History: Unknown  Social History:  Social History   Substance and Sexual Activity  Alcohol Use Yes  . Alcohol/week: 0.0 oz   Comment: Pt states he drinks about 1 beer to a 1/5th of liquor per day,  and his last drink  last nite at 2am     Social History   Substance and Sexual Activity  Drug Use No    Social History   Socioeconomic History  . Marital status: Single    Spouse name: None  . Number of children: None  . Years of education: None  . Highest education level: None  Social Needs  . Financial resource strain: None  . Food insecurity - worry: None  . Food insecurity - inability:  None  . Transportation needs - medical: None  . Transportation needs - non-medical: None  Occupational History  . None  Tobacco Use  . Smoking status: Former Smoker    Types: Cigarettes  . Smokeless tobacco: Never Used  . Tobacco comment: quit 2013  Substance and Sexual Activity  . Alcohol use: Yes    Alcohol/week: 0.0 oz    Comment: Pt states he drinks about 1 beer to a 1/5th of liquor per day,  and  his last drink  last nite at 2am  . Drug use: No  . Sexual activity: Yes  Other Topics Concern  . None  Social History Narrative  . None   Additional Social History: N/A    Allergies:  No Known Allergies  Labs:  Results for orders placed or performed during the hospital encounter of 12/10/17 (from the past 48 hour(s))  Comprehensive metabolic panel     Status: Abnormal   Collection Time: 12/10/17  1:18 PM  Result Value Ref Range   Sodium 142 135 - 145 mmol/L   Potassium 3.9 3.5 - 5.1 mmol/L   Chloride 108 101 - 111 mmol/L   CO2 24 22 - 32 mmol/L   Glucose, Bld 131 (H) 65 - 99 mg/dL   BUN 9 6 - 20 mg/dL   Creatinine, Ser 0.75 0.61 - 1.24 mg/dL   Calcium 8.6 (L) 8.9 - 10.3 mg/dL   Total Protein 7.6 6.5 - 8.1 g/dL   Albumin 3.9 3.5 - 5.0 g/dL   AST 168 (H) 15 - 41 U/L   ALT 172 (H) 17 - 63 U/L   Alkaline Phosphatase 104 38 - 126 U/L   Total Bilirubin 0.6 0.3 - 1.2 mg/dL   GFR calc non Af Amer >60 >60 mL/min   GFR calc Af Amer >60 >60 mL/min    Comment: (NOTE) The eGFR has been calculated using the CKD EPI equation. This calculation has not been validated in all clinical situations. eGFR's persistently <60 mL/min signify possible Chronic Kidney Disease.    Anion gap 10 5 - 15  Ethanol     Status: Abnormal   Collection Time: 12/10/17  1:18 PM  Result Value Ref Range   Alcohol, Ethyl (B) 295 (H) <10 mg/dL    Comment:        LOWEST DETECTABLE LIMIT FOR SERUM ALCOHOL IS 10 mg/dL FOR MEDICAL PURPOSES ONLY   cbc     Status: Abnormal   Collection Time: 12/10/17  1:18 PM  Result Value Ref Range   WBC 6.0 4.0 - 10.5 K/uL   RBC 4.91 4.22 - 5.81 MIL/uL   Hemoglobin 17.0 13.0 - 17.0 g/dL   HCT 47.3 39.0 - 52.0 %   MCV 96.3 78.0 - 100.0 fL   MCH 34.6 (H) 26.0 - 34.0 pg   MCHC 35.9 30.0 - 36.0 g/dL   RDW 13.2 11.5 - 15.5 %   Platelets 125 (L) 150 - 400 K/uL  Lipase, blood     Status: None   Collection Time: 12/10/17  1:18 PM  Result Value Ref Range   Lipase 43 11 - 51  U/L  Rapid urine drug screen (hospital performed)     Status: Abnormal   Collection Time: 12/10/17  9:46 PM  Result Value Ref Range   Opiates NONE DETECTED NONE DETECTED   Cocaine NONE DETECTED NONE DETECTED   Benzodiazepines POSITIVE (A) NONE DETECTED   Amphetamines NONE DETECTED NONE DETECTED   Tetrahydrocannabinol NONE DETECTED NONE DETECTED  Barbiturates NONE DETECTED NONE DETECTED    Comment: (NOTE) DRUG SCREEN FOR MEDICAL PURPOSES ONLY.  IF CONFIRMATION IS NEEDED FOR ANY PURPOSE, NOTIFY LAB WITHIN 5 DAYS. LOWEST DETECTABLE LIMITS FOR URINE DRUG SCREEN Drug Class                     Cutoff (ng/mL) Amphetamine and metabolites    1000 Barbiturate and metabolites    200 Benzodiazepine                 673 Tricyclics and metabolites     300 Opiates and metabolites        300 Cocaine and metabolites        300 THC                            50     Current Facility-Administered Medications  Medication Dose Route Frequency Provider Last Rate Last Dose  . alum & mag hydroxide-simeth (MAALOX/MYLANTA) 200-200-20 MG/5ML suspension 30 mL  30 mL Oral Q6H PRN Pisciotta, Nicole, PA-C      . lisinopril (PRINIVIL,ZESTRIL) tablet 20 mg  20 mg Oral QHS Pisciotta, Nicole, PA-C   20 mg at 12/10/17 2139  . LORazepam (ATIVAN) injection 0-4 mg  0-4 mg Intravenous Q6H Pisciotta, Nicole, PA-C   2 mg at 12/11/17 4193   Or  . LORazepam (ATIVAN) tablet 0-4 mg  0-4 mg Oral Q6H Pisciotta, Nicole, PA-C      . [START ON 12/12/2017] LORazepam (ATIVAN) injection 0-4 mg  0-4 mg Intravenous Q12H Pisciotta, Nicole, PA-C       Or  . Derrill Memo ON 12/12/2017] LORazepam (ATIVAN) tablet 0-4 mg  0-4 mg Oral Q12H Pisciotta, Nicole, PA-C      . nicotine (NICODERM CQ - dosed in mg/24 hours) patch 21 mg  21 mg Transdermal Daily Pisciotta, Nicole, PA-C   21 mg at 12/11/17 0945  . ondansetron (ZOFRAN) tablet 4 mg  4 mg Oral Q8H PRN Pisciotta, Nicole, PA-C      . thiamine (VITAMIN B-1) tablet 100 mg  100 mg Oral Daily  Pisciotta, Elmyra Ricks, PA-C   100 mg at 12/11/17 7902   Or  . thiamine (B-1) injection 100 mg  100 mg Intravenous Daily Pisciotta, Nicole, PA-C      . traZODone (DESYREL) tablet 50 mg  50 mg Oral QHS Pisciotta, Nicole, PA-C      . zolpidem (AMBIEN) tablet 5 mg  5 mg Oral QHS PRN Berton Mount, RPH       Current Outpatient Medications  Medication Sig Dispense Refill  . busPIRone (BUSPAR) 10 MG tablet Take 10 mg by mouth 2 (two) times daily.    . chlordiazePOXIDE (LIBRIUM) 25 MG capsule TAKE 2 CAPS BY MOUTH 3 TIMES A DAY X1DAY, THEN 1-2 CAPS TWICE A DAY X1DAY, THEN 1-2 CAPS X1 DAY  0  . diphenhydrAMINE (BENADRYL) 25 MG tablet Take 1-3 mg by mouth at bedtime as needed for sleep.    Marland Kitchen escitalopram (LEXAPRO) 20 MG tablet Take 20 mg by mouth daily.  2  . folic acid (FOLVITE) 1 MG tablet Take 1 tablet (1 mg total) by mouth daily. 30 tablet 0  . ibuprofen (ADVIL,MOTRIN) 200 MG tablet Take 400 mg by mouth every 6 (six) hours as needed for headache.    . lisinopril (PRINIVIL,ZESTRIL) 20 MG tablet Take 1 tablet (20 mg total) by mouth at bedtime. 30 tablet 0  . Melatonin 5  MG TABS Take 2.5 mg by mouth at bedtime as needed.    . Multiple Vitamin (MULTIVITAMIN WITH MINERALS) TABS tablet Take 1 tablet by mouth daily.     Marland Kitchen thiamine 100 MG tablet Take 1 tablet (100 mg total) by mouth daily. 30 tablet 0  . traZODone (DESYREL) 50 MG tablet Take 0.5-1 tablets (25-50 mg total) by mouth at bedtime as needed for sleep. (Patient taking differently: Take 50 mg by mouth at bedtime. ) 30 tablet 2    Musculoskeletal: Strength & Muscle Tone: within normal limits Gait & Station: normal Patient leans: N/A  Psychiatric Specialty Exam: Physical Exam  Nursing note and vitals reviewed. Constitutional: He is oriented to person, place, and time. He appears well-developed and well-nourished.  HENT:  Head: Normocephalic and atraumatic.  Neck: Normal range of motion.  Respiratory: Effort normal.  Musculoskeletal: Normal  range of motion.  Neurological: He is alert and oriented to person, place, and time.  Skin: No rash noted.  Psychiatric: He has a normal mood and affect. His speech is normal and behavior is normal. Judgment and thought content normal. Cognition and memory are normal.    Review of Systems  Neurological: Positive for tremors.  Psychiatric/Behavioral: Positive for depression and substance abuse. Negative for hallucinations and suicidal ideas.  All other systems reviewed and are negative.   Blood pressure (!) 154/98, pulse 82, temperature 97.8 F (36.6 C), temperature source Oral, resp. rate 20, SpO2 100 %.There is no height or weight on file to calculate BMI.  General Appearance: Well Groomed, middle aged, Caucasian male who is lying in bed. NAD.   Eye Contact:  Good  Speech:  Clear and Coherent and Normal Rate  Volume:  Normal  Mood:  "Okay"  Affect:  Appropriate and Full Range  Thought Process:  Goal Directed and Linear  Orientation:  Full (Time, Place, and Person)  Thought Content:  Logical  Suicidal Thoughts:  No  Homicidal Thoughts:  No  Memory:  Immediate;   Good Recent;   Good Remote;   Good  Judgement:  Good  Insight:  Good  Psychomotor Activity:  Normal  Concentration:  Concentration: Good and Attention Span: Good  Recall:  Good  Fund of Knowledge:  Good  Language:  Good  Akathisia:  No  Handed:  Right  AIMS (if indicated):   N/A  Assets:  Communication Skills Desire for Improvement Financial Resources/Insurance Social Support  ADL's:  Intact  Cognition:  WNL  Sleep:   N/A   Assessment: Ashkan Chamberland is a 46 y.o. male who was admitted with alcohol intoxication and desire for detox. Peer support was consulted to provide resources for detox. If a bed cannot be found then he will likely need to be admitted to the medical floor for detox as he does not meet criteria for inpatient psychiatric hospitalization. He denies SI, HI and AVH.   Treatment Plan  Summary: -Patient is psychiatrically cleared. Peer support will provide resources for detox and substance abuse treatment. He will likely need to be admitted to the medical floor if there are no beds available for outside facility detox.   Disposition: No evidence of imminent risk to self or others at present.   Patient does not meet criteria for psychiatric inpatient admission.  Faythe Dingwall, DO 12/11/2017 10:24 AM

## 2017-12-11 NOTE — ED Notes (Signed)
Per Nicholas Price the patient is cleared psych wise and should be admitted under EDP for medical needs.

## 2017-12-11 NOTE — Patient Outreach (Signed)
CPSS spoke with patient and plans to follow up with him in the community in regards to getting him into residential substance use treatment/detox. Patient plans to stay at his mom's house and will keep in contact with him after he is discharged. CPSS gave him both CPSS contact information and encouraged the patient to follow up with CPSS at any time for substance use recovery support.

## 2017-12-11 NOTE — ED Notes (Signed)
Pt mother is visiting with patient

## 2017-12-11 NOTE — Patient Outreach (Signed)
ED Peer Support Specialist Patient Intake (Complete at intake & 30-60 Day Follow-up)  Name: Nicholas Price  MRN: [id number]  Age: 46 y.o.   Date of Admission: 12/11/2017  Intake: Initial Comments:      Primary Reason Admitted: alcohol use, patient requesting assistence for alcohol withdrawal/detox  Lab values: Alcohol/ETOH:   Positive UDS?   Amphetamines:   Barbiturates:   Benzodiazepines:   Cocaine:   Opiates:   Cannabinoids:    Demographic information: Gender: Male Ethnicity: White Marital Status: Married Community education officernsurance Status: Private Insurance(BCBS) Control and instrumentation engineereceives non-medical governmental assistance (Work Engineer, agriculturalirst/Welfare, Sales executivefood stamps, etc.: No Lives with: Partner/Spouse, Child under 18 years(Wife and children) Living situation: House/Apartment  Reported Patient History: Patient reported health conditions: Depression, Diabetes, Heart disease, Anxiety disorders(Diabetes) Patient aware of HIV and hepatitis status: Yes (comment)(Negative)  In past year, has patient visited ED for any reason? Yes  Number of ED visits: 1  Reason(s) for visit: detox for alcohol  In past year, has patient been hospitalized for any reason? Yes  Number of hospitalizations: 1  Reason(s) for hospitalization: detox for alcohol  In past year, has patient been arrested? No  Number of arrests:    Reason(s) for arrest:    In past year, has patient been incarcerated? No  Number of incarcerations:    Reason(s) for incarceration:    In past year, has patient received medication-assisted treatment? No  In past year, patient received the following treatments: Peer support group(AA meetings)  In past year, has patient received any harm reduction services? No  Did this include any of the following?    In past year, has patient received care from a mental health provider for diagnosis other than SUD? No  In past year, is this first time patient has overdosed? (has not overdosed)  Number of past overdoses:     In past year, is this first time patient has been hospitalized for an overdose? (has not overdosed)  Number of hospitalizations for overdose(s):    Is patient currently receiving treatment for a mental health diagnosis? No  Patient reports experiencing difficulty participating in SUD treatment: No    Most important reason(s) for this difficulty?    Has patient received prior services for treatment? Yes([facility])  In past, patient has received services from following agencies: Other (comment)  Plan of Care:  Suggested follow up at these agencies/treatment centers:    Other information: CPSS spoke with the patient and he stated that he is interested in detox/substance use treatment. Patient is going to weigh options for substance use treatment/detox and talk to his wife/family members. [facility] [address] [address] [facility] [facility]. CPSS will meet with the patient again in one hour to discuss which one he is interested in most and make the referral.    Bartholomew BoardsJohn Rayson Rando, CPSS  12/11/2017 1:27 PM

## 2017-12-15 ENCOUNTER — Ambulatory Visit (HOSPITAL_COMMUNITY): Payer: Self-pay

## 2017-12-15 NOTE — Patient Outreach (Unsigned)
Patient informed CPSS with a phone encounter yesterday Saturday 12/14/17 [facility]. CPSS has been helping with placement for inpatient substance use treatment since Wednesday 12/11/17. CPSS informed the patient to feel free to contact CPSS at anytime for substance use recovery support.

## 2017-12-18 ENCOUNTER — Telehealth (HOSPITAL_COMMUNITY): Payer: Self-pay

## 2018-01-03 ENCOUNTER — Telehealth: Payer: Self-pay

## 2018-01-03 NOTE — Telephone Encounter (Signed)
Left message on voicemail.

## 2018-01-03 NOTE — Telephone Encounter (Signed)
Can we call him. Last time I saw him, he was not taking Buspar, and I started him on Lexapro. Is he taking Buspar and when was he started on this?

## 2018-01-03 NOTE — Telephone Encounter (Signed)
Copied from CRM [phone]#[id number]. Topic: General - Other >> Jan 03, 2018 10:09 AM Gerrianne ScalePayne, Angela L wrote: Reason for CRM:  [facility] [id number] - [place] [place] - [address] [phone] (Phone) [phone])  calling stating that they have busPIRone (BUSPAR) 10 MG tablet on back order but they will be able to give patient the  Buspirone 5mg  30mg  or 7.5 mg they do have that at the pharmacy and wanted to know if the RX can be changed to one of those dosages

## 2018-01-20 ENCOUNTER — Other Ambulatory Visit: Payer: Self-pay

## 2018-01-20 ENCOUNTER — Emergency Department (HOSPITAL_COMMUNITY)
Admission: EM | Admit: 2018-01-20 | Discharge: 2018-01-21 | Disposition: A | Discharge status: Home / Self Care | Payer: Self-pay | Attending: Emergency Medicine | Admitting: Emergency Medicine

## 2018-01-20 ENCOUNTER — Encounter (HOSPITAL_COMMUNITY): Payer: Self-pay

## 2018-01-20 DIAGNOSIS — F10120 Alcohol abuse with intoxication, uncomplicated: Secondary | ICD-10-CM | POA: Insufficient documentation

## 2018-01-20 DIAGNOSIS — F32A Depression, unspecified: Secondary | ICD-10-CM

## 2018-01-20 DIAGNOSIS — I10 Essential (primary) hypertension: Secondary | ICD-10-CM | POA: Insufficient documentation

## 2018-01-20 DIAGNOSIS — R45851 Suicidal ideations: Secondary | ICD-10-CM | POA: Insufficient documentation

## 2018-01-20 DIAGNOSIS — Y907 Blood alcohol level of 200-239 mg/100 ml: Secondary | ICD-10-CM | POA: Insufficient documentation

## 2018-01-20 DIAGNOSIS — Z87891 Personal history of nicotine dependence: Secondary | ICD-10-CM | POA: Insufficient documentation

## 2018-01-20 DIAGNOSIS — F329 Major depressive disorder, single episode, unspecified: Secondary | ICD-10-CM | POA: Insufficient documentation

## 2018-01-20 DIAGNOSIS — F1010 Alcohol abuse, uncomplicated: Secondary | ICD-10-CM

## 2018-01-20 DIAGNOSIS — Z79899 Other long term (current) drug therapy: Secondary | ICD-10-CM | POA: Insufficient documentation

## 2018-01-20 LAB — CBC
HEMATOCRIT: 50.4 % (ref 39.0–52.0)
Hemoglobin: 17.7 g/dL — ABNORMAL HIGH (ref 13.0–17.0)
MCH: 33.8 pg (ref 26.0–34.0)
MCHC: 35.1 g/dL (ref 30.0–36.0)
MCV: 96.2 fL (ref 78.0–100.0)
PLATELETS: 112 10*3/uL — AB (ref 150–400)
RBC: 5.24 MIL/uL (ref 4.22–5.81)
RDW: 13.2 % (ref 11.5–15.5)
WBC: 5.5 10*3/uL (ref 4.0–10.5)

## 2018-01-20 LAB — COMPREHENSIVE METABOLIC PANEL
ALBUMIN: 3.7 g/dL (ref 3.5–5.0)
ALK PHOS: 81 U/L (ref 38–126)
ALT: 151 U/L — ABNORMAL HIGH (ref 17–63)
AST: 132 U/L — ABNORMAL HIGH (ref 15–41)
Anion gap: 13 (ref 5–15)
BILIRUBIN TOTAL: 0.9 mg/dL (ref 0.3–1.2)
BUN: 10 mg/dL (ref 6–20)
CALCIUM: 9.4 mg/dL (ref 8.9–10.3)
CO2: 24 mmol/L (ref 22–32)
Chloride: 105 mmol/L (ref 101–111)
Creatinine, Ser: 1.01 mg/dL (ref 0.61–1.24)
GFR calc Af Amer: >60 mL/min (ref >60)
GLUCOSE: 117 mg/dL — AB (ref 65–99)
Potassium: 3.8 mmol/L (ref 3.5–5.1)
Sodium: 142 mmol/L (ref 135–145)
TOTAL PROTEIN: 7.3 g/dL (ref 6.5–8.1)

## 2018-01-20 LAB — SALICYLATE LEVEL: Salicylate Lvl: <7 mg/dL (ref 2.8–30.0)

## 2018-01-20 LAB — ACETAMINOPHEN LEVEL: Acetaminophen (Tylenol), Serum: <10 ug/mL — ABNORMAL LOW (ref 10–30)

## 2018-01-20 LAB — ETHANOL: ALCOHOL ETHYL (B): 217 mg/dL — AB (ref <10)

## 2018-01-20 MED ORDER — LORAZEPAM 2 MG/ML IJ SOLN: 0.0000 mg | Freq: Two times a day (BID) | INTRAMUSCULAR | Status: DC

## 2018-01-20 MED ORDER — VITAMIN B-1 100 MG PO TABS
100.0000 mg | ORAL_TABLET | Freq: Every day | ORAL | Status: DC
  Administered 2018-01-20 – 2018-01-21 (×2): 100 mg via ORAL
  Filled 2018-01-20 (×2): qty 1

## 2018-01-20 MED ORDER — LORAZEPAM 2 MG/ML IJ SOLN: 0.0000 mg | Freq: Four times a day (QID) | INTRAMUSCULAR | Status: DC

## 2018-01-20 MED ORDER — LORAZEPAM 1 MG PO TABS: 0.0000 mg | ORAL_TABLET | Freq: Two times a day (BID) | ORAL | Status: DC

## 2018-01-20 MED ORDER — LORAZEPAM 1 MG PO TABS
0.0000 mg | ORAL_TABLET | Freq: Four times a day (QID) | ORAL | Status: DC
  Administered 2018-01-21: 2 mg via ORAL
  Filled 2018-01-20: qty 2

## 2018-01-20 MED ORDER — THIAMINE HCL 100 MG/ML IJ SOLN: 100.0000 mg | Freq: Every day | INTRAMUSCULAR | Status: DC

## 2018-01-20 NOTE — ED Triage Notes (Signed)
Pt states he has hx of alcohol abuse X25 years. Pt states over the last 2 weeks he has been having thoughts of harming himself. Pt denies a plan. He is pleasant and cooperative in triage. Last drink this morning around 1130.

## 2018-01-20 NOTE — ED Notes (Signed)
[facility], with sitter. Belongings (2 labeled bags) found in Triage area and given to BrookportMonique, Charity fundraiserN.   Dinner tray ordered and given to pt upon arrival in pod F.
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
## 2018-01-20 NOTE — ED Notes (Signed)
TTS in process-Monique,RN  

## 2018-01-20 NOTE — ED Provider Notes (Signed)
[facility] EMERGENCY DEPARTMENT Provider Note   CSN: [id number] Arrival date & time: 01/20/18  1527     History   Chief Complaint Chief Complaint  Patient presents with  . Alcohol Problem  . Suicidal    HPI Nicholas Price is a 47 y.o. male.  Patient presents with etoh abuse. States was in rehab/detox at [facility], was released 2-3 weeks ago, and immediately resumed drinking etoh. Drinks daily. Last drank today. Denies hx seizures or dts. States now very depressed, mainly as a result of drinking. +suicidal thoughts. Denies plan or attempt to harm self.  States was on lexapro, but was switched to celexa at [facility].  Has been eating and drinking fine. Some trouble sleeping at night.    The history is provided by the patient.  Alcohol Problem  Pertinent negatives include no chest pain, no abdominal pain, no headaches and no shortness of breath.    Past Medical History:  Diagnosis Date  . Alcohol abuse   . Depression   . Hypertension     Patient Active Problem List   Diagnosis Date Noted  . Alcohol abuse with alcohol-induced mood disorder (HCC) 12/11/2017  . Alcohol abuse 11/26/2017  . Alcohol abuse with intoxication (HCC) 11/26/2017  . Alcohol withdrawal (HCC) 11/26/2017  . GERD (gastroesophageal reflux disease) 11/26/2017  . Abnormal LFTs 11/26/2017  . Abdominal pain 11/26/2017  . Insomnia 07/07/2015  . Anxiety and depression   . Hypertension 07/10/2012    History reviewed. No pertinent surgical history.     Home Medications    Prior to Admission medications   Medication Sig Start Date End Date Taking? Authorizing Provider  busPIRone (BUSPAR) 10 MG tablet Take 10 mg by mouth 2 (two) times daily.    [provider]  chlordiazePOXIDE (LIBRIUM) 25 MG capsule TAKE 2 CAPS BY MOUTH 3 TIMES A DAY X1DAY, THEN 1-2 CAPS TWICE A DAY X1DAY, THEN 1-2 CAPS X1 DAY 09/20/17   [provider]  chlordiazePOXIDE (LIBRIUM) 25 MG capsule 50mg  PO TID x 1D,  then 25-50mg  PO BID X 1D, then 25-50mg  PO QD X 1D 12/11/17   [name], MD  diphenhydrAMINE (BENADRYL) 25 MG tablet Take 1-3 mg by mouth at bedtime as needed for sleep.    [provider]  escitalopram (LEXAPRO) 20 MG tablet Take 20 mg by mouth daily. 11/05/17   [provider]  folic acid (FOLVITE) 1 MG tablet Take 1 tablet (1 mg total) by mouth daily. 11/30/17   Albertine Grates, MD  ibuprofen (ADVIL,MOTRIN) 200 MG tablet Take 400 mg by mouth every 6 (six) hours as needed for headache.    [provider]  lisinopril (PRINIVIL,ZESTRIL) 20 MG tablet Take 1 tablet (20 mg total) by mouth at bedtime. 11/29/17   Albertine Grates, MD  Melatonin 5 MG TABS Take 2.5 mg by mouth at bedtime as needed.    [provider]  Multiple Vitamin (MULTIVITAMIN WITH MINERALS) TABS tablet Take 1 tablet by mouth daily.     [provider]  thiamine 100 MG tablet Take 1 tablet (100 mg total) by mouth daily. 11/30/17   Albertine Grates, MD  traZODone (DESYREL) 50 MG tablet Take 0.5-1 tablets (25-50 mg total) by mouth at bedtime as needed for sleep. Patient taking differently: Take 50 mg by mouth at bedtime.  09/03/17   Lorre Munroe, NP    Family History Family History  Problem Relation Age of Onset  . Anxiety disorder Brother   . Cancer  Neg Hx   . Diabetes Neg Hx   . Stroke Neg Hx   . Heart disease Neg Hx     Social History Social History   Tobacco Use  . Smoking status: Former Smoker    Types: Cigarettes  . Smokeless tobacco: Never Used  . Tobacco comment: quit 2013  Substance Use Topics  . Alcohol use: Yes    Alcohol/week: 0.0 oz    Comment: Pt states he drinks about 1 beer to a 1/5th of liquor per day,  and his last drink  last nite at 2am  . Drug use: No     Allergies   Patient has no known allergies.   Review of Systems Review of Systems  Constitutional: Negative for fever.  HENT: Negative for sore throat.   Eyes: Negative for redness.  Respiratory: Negative for  shortness of breath.   Cardiovascular: Negative for chest pain.  Gastrointestinal: Negative for abdominal pain and vomiting.  Genitourinary: Negative for flank pain.  Musculoskeletal: Negative for back pain and neck pain.  Skin: Negative for rash.  Neurological: Negative for headaches.  Hematological: Does not bruise/bleed easily.  Psychiatric/Behavioral: Positive for dysphoric mood.     Physical Exam Updated Vital Signs BP 113/79 (BP Location: Right Arm)   Pulse (!) 122   Temp 98.4 F (36.9 C) (Oral)   Resp 16   SpO2 95%   Physical Exam  Constitutional: He appears well-developed and well-nourished. No distress.  HENT:  Head: Atraumatic.  Mouth/Throat: Oropharynx is clear and moist.  Eyes: Conjunctivae are normal.  Neck: Neck supple. No tracheal deviation present.  Cardiovascular: Normal rate, regular rhythm, normal heart sounds and intact distal pulses.  Pulmonary/Chest: Effort normal and breath sounds normal. No accessory muscle usage. No respiratory distress.  Abdominal: Soft. He exhibits no distension. There is no tenderness.  Musculoskeletal: He exhibits no edema.  Neurological: He is alert.  Speech clear/fluent. Steady gait.   Skin: Skin is warm and dry. He is not diaphoretic.  Psychiatric: He has a normal mood and affect.  Nursing note and vitals reviewed.    ED Treatments / Results  Labs (all labs ordered are listed, but only abnormal results are displayed)  Results for orders placed or performed during the hospital encounter of 01/20/18  Comprehensive metabolic panel  Result Value Ref Range   Sodium 142 135 - 145 mmol/L   Potassium 3.8 3.5 - 5.1 mmol/L   Chloride 105 101 - 111 mmol/L   CO2 24 22 - 32 mmol/L   Glucose, Bld 117 (H) 65 - 99 mg/dL   BUN 10 6 - 20 mg/dL   Creatinine, Ser 1.61 0.61 - 1.24 mg/dL   Calcium 9.4 8.9 - 09.6 mg/dL   Total Protein 7.3 6.5 - 8.1 g/dL   Albumin 3.7 3.5 - 5.0 g/dL   AST 045 (H) 15 - 41 U/L   ALT 151 (H) 17 - 63 U/L     Alkaline Phosphatase 81 38 - 126 U/L   Total Bilirubin 0.9 0.3 - 1.2 mg/dL   GFR calc non Af Amer >60 >60 mL/min   GFR calc Af Amer >60 >60 mL/min   Anion gap 13 5 - 15  Ethanol  Result Value Ref Range   Alcohol, Ethyl (B) 217 (H) <10 mg/dL  Salicylate level  Result Value Ref Range   Salicylate Lvl <7.0 2.8 - 30.0 mg/dL  Acetaminophen level  Result Value Ref Range   Acetaminophen (Tylenol), Serum <10 (L) 10 - 30 ug/mL  cbc  Result Value Ref Range   WBC 5.5 4.0 - 10.5 K/uL   RBC 5.24 4.22 - 5.81 MIL/uL   Hemoglobin 17.7 (H) 13.0 - 17.0 g/dL   HCT 16.150.4 09.639.0 - 04.552.0 %   MCV 96.2 78.0 - 100.0 fL   MCH 33.8 26.0 - 34.0 pg   MCHC 35.1 30.0 - 36.0 g/dL   RDW 40.913.2 81.111.5 - 91.415.5 %   Platelets PENDING 150 - 400 K/uL    EKG  EKG Interpretation None       Radiology No results found.  Procedures Procedures (including critical care time)  Medications Ordered in ED Medications - No data to display   Initial Impression / Assessment and Plan / ED Course  I have reviewed the triage vital signs and the nursing notes.  Pertinent labs & imaging results that were available during my care of the patient were reviewed by me and considered in my medical decision making (see chart for details).  Labs sent.   BH team consulted re depression/SI.  Reviewed nursing notes and prior charts for additional history.   Psych holding orders placed.  [facility]     Final Clinical Impressions(s) / ED Diagnoses   Final diagnoses:  None    ED Discharge Orders    None       Cathren LaineSteinl, Carnell Beavers, MD 01/20/18 [phone]

## 2018-01-21 DIAGNOSIS — Z62811 Personal history of psychological abuse in childhood: Secondary | ICD-10-CM

## 2018-01-21 DIAGNOSIS — Z818 Family history of other mental and behavioral disorders: Secondary | ICD-10-CM

## 2018-01-21 DIAGNOSIS — F1010 Alcohol abuse, uncomplicated: Secondary | ICD-10-CM

## 2018-01-21 DIAGNOSIS — Z87891 Personal history of nicotine dependence: Secondary | ICD-10-CM

## 2018-01-21 DIAGNOSIS — Z6379 Other stressful life events affecting family and household: Secondary | ICD-10-CM

## 2018-01-21 DIAGNOSIS — Z62810 Personal history of physical and sexual abuse in childhood: Secondary | ICD-10-CM

## 2018-01-21 DIAGNOSIS — R450 Nervousness: Secondary | ICD-10-CM

## 2018-01-21 DIAGNOSIS — F332 Major depressive disorder, recurrent severe without psychotic features: Secondary | ICD-10-CM

## 2018-01-21 DIAGNOSIS — F419 Anxiety disorder, unspecified: Secondary | ICD-10-CM

## 2018-01-21 DIAGNOSIS — G4700 Insomnia, unspecified: Secondary | ICD-10-CM

## 2018-01-21 LAB — RAPID URINE DRUG SCREEN, HOSP PERFORMED
Amphetamines: NOT DETECTED
BENZODIAZEPINES: POSITIVE — AB
Barbiturates: NOT DETECTED
Cocaine: NOT DETECTED
OPIATES: NOT DETECTED
Tetrahydrocannabinol: NOT DETECTED

## 2018-01-21 MED ORDER — TRAZODONE HCL 50 MG PO TABS: 50.0000 mg | ORAL_TABLET | Freq: Every day | ORAL | Status: DC

## 2018-01-21 MED ORDER — CITALOPRAM HYDROBROMIDE 10 MG PO TABS
20.0000 mg | ORAL_TABLET | Freq: Every day | ORAL | Status: DC
  Administered 2018-01-21: 20 mg via ORAL
  Filled 2018-01-21: qty 2

## 2018-01-21 MED ORDER — FOLIC ACID 1 MG PO TABS: 1.0000 mg | ORAL_TABLET | Freq: Every day | ORAL | Status: DC

## 2018-01-21 MED ORDER — CHLORDIAZEPOXIDE HCL 25 MG PO CAPS: ORAL_CAPSULE | ORAL | 0 refills | Status: DC

## 2018-01-21 MED ORDER — THIAMINE HCL 100 MG PO TABS: 100.0000 mg | ORAL_TABLET | Freq: Every day | ORAL | Status: DC

## 2018-01-21 MED ORDER — LISINOPRIL 20 MG PO TABS: 20.0000 mg | ORAL_TABLET | Freq: Every day | ORAL | Status: DC

## 2018-01-21 NOTE — ED Provider Notes (Signed)
I spoke with TTS.  They feel Nicholas Price does not need inpatient treatment.  I spoke to Nicholas Price.  He states he is not suicidal or homicidal.  Nicholas Price reports he has some shakes but ativan helps.  Nicholas Price is in agreement with out patinet treatment.    I will give Nicholas Price Librium taper.  He reports this has been helpful in the past.  He will return if any problems. Nicholas Price given referral by TTS   An After Visit Summary was printed and given to the patient.   Elson AreasSofia, Griffey Nicasio K, [place]-C 01/21/18 1003    Alvira MondaySchlossman, Erin, MD 01/21/18 [id number]

## 2018-01-21 NOTE — ED Notes (Signed)
Resources given for tx and shelters - pt voiced understanding to follow up w/[facility] or any other facility he chooses.

## 2018-01-21 NOTE — Discharge Instructions (Signed)
Follow up as instructed by Behavioral Health

## 2018-01-21 NOTE — ED Notes (Signed)
States he will be driving himself home - Recommended for pt to call someone to pick him up d/t being given Ativan - Voiced understanding and stated he will call an [place]. Voiced understanding of no driving x 4-6 hours after taking Ativan.
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
## 2018-01-21 NOTE — Progress Notes (Signed)
[facility] Kriste BasqueBecky, and notified that pt was assessed this morning and discharge is recommended with referrals to MH/SA Outpatient treatment and Housing.  CSW faxed referrals,  Timmothy EulerJean T. Kaylyn LimSutter, MSW, LCSWA Disposition Clinical Social Work [phone] (cell) [phone] (office)

## 2018-02-21 ENCOUNTER — Telehealth (HOSPITAL_COMMUNITY): Payer: Self-pay

## 2018-03-03 ENCOUNTER — Encounter: Payer: Self-pay | Admitting: Internal Medicine

## 2018-03-25 ENCOUNTER — Other Ambulatory Visit: Payer: Self-pay

## 2018-03-25 ENCOUNTER — Emergency Department (HOSPITAL_COMMUNITY)
Admission: EM | Admit: 2018-03-25 | Discharge: 2018-03-26 | Disposition: A | Discharge status: Home / Self Care | Payer: Self-pay | Attending: Emergency Medicine | Admitting: Emergency Medicine

## 2018-03-25 DIAGNOSIS — I10 Essential (primary) hypertension: Secondary | ICD-10-CM | POA: Insufficient documentation

## 2018-03-25 DIAGNOSIS — R45851 Suicidal ideations: Secondary | ICD-10-CM | POA: Insufficient documentation

## 2018-03-25 DIAGNOSIS — F332 Major depressive disorder, recurrent severe without psychotic features: Secondary | ICD-10-CM | POA: Insufficient documentation

## 2018-03-25 DIAGNOSIS — Y908 Blood alcohol level of 240 mg/100 ml or more: Secondary | ICD-10-CM | POA: Insufficient documentation

## 2018-03-25 DIAGNOSIS — F1014 Alcohol abuse with alcohol-induced mood disorder: Secondary | ICD-10-CM | POA: Insufficient documentation

## 2018-03-25 LAB — RAPID URINE DRUG SCREEN, HOSP PERFORMED
AMPHETAMINES: NOT DETECTED
BENZODIAZEPINES: POSITIVE — AB
Barbiturates: NOT DETECTED
COCAINE: NOT DETECTED
OPIATES: NOT DETECTED
Tetrahydrocannabinol: NOT DETECTED

## 2018-03-25 LAB — COMPREHENSIVE METABOLIC PANEL
ALK PHOS: 87 U/L (ref 38–126)
ALT: 97 U/L — ABNORMAL HIGH (ref 17–63)
ANION GAP: 13 (ref 5–15)
AST: 104 U/L — ABNORMAL HIGH (ref 15–41)
Albumin: 3.6 g/dL (ref 3.5–5.0)
BILIRUBIN TOTAL: 0.9 mg/dL (ref 0.3–1.2)
BUN: 11 mg/dL (ref 6–20)
CALCIUM: 9 mg/dL (ref 8.9–10.3)
CO2: 26 mmol/L (ref 22–32)
Chloride: 109 mmol/L (ref 101–111)
Creatinine, Ser: 0.73 mg/dL (ref 0.61–1.24)
GFR calc Af Amer: >60 mL/min (ref >60)
Glucose, Bld: 155 mg/dL — ABNORMAL HIGH (ref 65–99)
POTASSIUM: 3.9 mmol/L (ref 3.5–5.1)
Sodium: 148 mmol/L — ABNORMAL HIGH (ref 135–145)
TOTAL PROTEIN: 7.1 g/dL (ref 6.5–8.1)

## 2018-03-25 LAB — CBC WITH DIFFERENTIAL/PLATELET
Basophils Absolute: 0 10*3/uL (ref 0.0–0.1)
Basophils Relative: 0 %
Eosinophils Absolute: 0.2 10*3/uL (ref 0.0–0.7)
Eosinophils Relative: 3 %
HEMATOCRIT: 53.5 % — AB (ref 39.0–52.0)
Hemoglobin: 18.8 g/dL — ABNORMAL HIGH (ref 13.0–17.0)
LYMPHS ABS: 2.8 10*3/uL (ref 0.7–4.0)
LYMPHS PCT: 44 %
MCH: 34.5 pg — ABNORMAL HIGH (ref 26.0–34.0)
MCHC: 35.1 g/dL (ref 30.0–36.0)
MCV: 98.2 fL (ref 78.0–100.0)
MONO ABS: 0.3 10*3/uL (ref 0.1–1.0)
MONOS PCT: 5 %
NEUTROS ABS: 3.2 10*3/uL (ref 1.7–7.7)
Neutrophils Relative %: 48 %
Platelets: 156 10*3/uL (ref 150–400)
RBC: 5.45 MIL/uL (ref 4.22–5.81)
RDW: 13.9 % (ref 11.5–15.5)
WBC: 6.5 10*3/uL (ref 4.0–10.5)

## 2018-03-25 LAB — ETHANOL: ALCOHOL ETHYL (B): 318 mg/dL — AB (ref <10)

## 2018-03-25 LAB — ACETAMINOPHEN LEVEL: Acetaminophen (Tylenol), Serum: <10 ug/mL — ABNORMAL LOW (ref 10–30)

## 2018-03-25 LAB — SALICYLATE LEVEL: Salicylate Lvl: <7 mg/dL (ref 2.8–30.0)

## 2018-03-25 MED ORDER — VITAMIN B-1 100 MG PO TABS
100.0000 mg | ORAL_TABLET | Freq: Every day | ORAL | Status: DC
  Administered 2018-03-25 – 2018-03-26 (×2): 100 mg via ORAL
  Filled 2018-03-25 (×2): qty 1

## 2018-03-25 MED ORDER — ALUM & MAG HYDROXIDE-SIMETH 200-200-20 MG/5ML PO SUSP: 30.0000 mL | Freq: Four times a day (QID) | ORAL | Status: DC | PRN

## 2018-03-25 MED ORDER — ONDANSETRON HCL 4 MG PO TABS
4.0000 mg | ORAL_TABLET | Freq: Three times a day (TID) | ORAL | Status: DC | PRN
  Administered 2018-03-25: 4 mg via ORAL
  Filled 2018-03-25: qty 1

## 2018-03-25 MED ORDER — LORAZEPAM 1 MG PO TABS
0.0000 mg | ORAL_TABLET | Freq: Two times a day (BID) | ORAL | Status: DC
Start: 2018-03-28 — End: 2018-03-26

## 2018-03-25 MED ORDER — THIAMINE HCL 100 MG/ML IJ SOLN: 100.0000 mg | Freq: Every day | INTRAMUSCULAR | Status: DC

## 2018-03-25 MED ORDER — IBUPROFEN 200 MG PO TABS: 600.0000 mg | ORAL_TABLET | Freq: Three times a day (TID) | ORAL | Status: DC | PRN

## 2018-03-25 MED ORDER — LORAZEPAM 1 MG PO TABS
0.0000 mg | ORAL_TABLET | Freq: Four times a day (QID) | ORAL | Status: DC
  Administered 2018-03-25: 1 mg via ORAL
  Administered 2018-03-26: 2 mg via ORAL
  Administered 2018-03-26: 1 mg via ORAL
  Filled 2018-03-25: qty 2
  Filled 2018-03-25 (×2): qty 1

## 2018-03-25 MED ORDER — LORAZEPAM 2 MG/ML IJ SOLN: 0.0000 mg | Freq: Two times a day (BID) | INTRAMUSCULAR | Status: DC

## 2018-03-25 MED ORDER — LISINOPRIL 20 MG PO TABS
20.0000 mg | ORAL_TABLET | Freq: Every day | ORAL | Status: DC
  Administered 2018-03-25: 20 mg via ORAL
  Filled 2018-03-25: qty 1

## 2018-03-25 MED ORDER — LORAZEPAM 2 MG/ML IJ SOLN
0.0000 mg | Freq: Four times a day (QID) | INTRAMUSCULAR | Status: DC
Start: 2018-03-25 — End: 2018-03-26
  Filled 2018-03-25: qty 1

## 2018-03-25 NOTE — ED Notes (Signed)
Pt alert x4 no distress noted, wants to try and get some sleep. I will continue to assess.

## 2018-03-25 NOTE — ED Triage Notes (Signed)
Pt is here d/t depression and SI with no plan.  Pt reports hx of depression and alcohol abuse.  Pt is crying in triage.  He reports drinking a fifth of liquor daily.  He states his wife wants to leave him and he do not want her to.  Pt is cooperative.  Denies any pain. He is hypertensive-hx of HTN and does not remember when was the last time he took his lisinopril.

## 2018-03-25 NOTE — ED Notes (Signed)
Pt. In burgundy scrubs. Pt. Has 1 belongings bag. Pt. Has 1 wallet, cell phone and 1 wedding ring. Pt. Belongings locked up in TCU locker #29.

## 2018-03-25 NOTE — ED Notes (Signed)
EKG given to EDP,Allen,MD., for review. 
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
## 2018-03-25 NOTE — ED Notes (Signed)
Bed: [facility] Expected date:  Expected time:  Means of arrival:  Comments: 

## 2018-03-25 NOTE — ED Provider Notes (Signed)
[facility]-EMERGENCY DEPT Provider Note   CSN: [id number] Arrival date & time: 03/25/18  1949     History   Chief Complaint No chief complaint on file.   HPI Nicholas Price is a 47 y.o. male.  47 year old male with history of alcohol abuse presents with suicidal ideations as well as request for detox from alcohol.  States that he normally drinks about 1/5 of alcohol a day and his last drink was this morning.  Denies any illicit drug use.  Has not been compliant with his medications for his psychiatric condition.  Has not had any withdrawal symptoms at this time.  Does not have a specific plan of suicide but states that he has been more depressed due to history of that his wife is going to leave him.  Denies any intentional ingestions at this time.  Has not been responding to internal stimuli.  Symptoms have been worse and he makes them better.     Past Medical History:  Diagnosis Date  . Alcohol abuse   . Depression   . Hypertension     Patient Active Problem List   Diagnosis Date Noted  . Alcohol abuse with alcohol-induced mood disorder (HCC) 12/11/2017  . Alcohol abuse 11/26/2017  . Alcohol abuse with intoxication (HCC) 11/26/2017  . Alcohol withdrawal (HCC) 11/26/2017  . GERD (gastroesophageal reflux disease) 11/26/2017  . Abnormal LFTs 11/26/2017  . Abdominal pain 11/26/2017  . Insomnia 07/07/2015  . Anxiety and depression   . Hypertension 07/10/2012    No past surgical history on file.      Home Medications    Prior to Admission medications   Medication Sig Start Date End Date Taking? Authorizing Provider  chlordiazePOXIDE (LIBRIUM) 25 MG capsule 50mg  PO TID x 1D, then 25-50mg  PO BID X 1D, then 25-50mg  PO QD X 1D 01/21/18   Elson Areas, PA-C  citalopram (CELEXA) 20 MG tablet Take 20 mg by mouth daily.    [provider]  folic acid (FOLVITE) 1 MG tablet Take 1 tablet (1 mg total) by mouth daily. Patient taking differently: Take  1 mg by mouth at bedtime.  11/30/17   Albertine Grates, MD  ibuprofen (ADVIL,MOTRIN) 200 MG tablet Take 400 mg by mouth every 6 (six) hours as needed for headache.    [provider]  lisinopril (PRINIVIL,ZESTRIL) 20 MG tablet Take 1 tablet (20 mg total) by mouth at bedtime. 11/29/17   Albertine Grates, MD  thiamine 100 MG tablet Take 1 tablet (100 mg total) by mouth daily. Patient not taking: Reported on 01/20/2018 11/30/17   Albertine Grates, MD  traZODone (DESYREL) 100 MG tablet Take 50-100 mg by mouth at bedtime.    [provider]  traZODone (DESYREL) 50 MG tablet Take 0.5-1 tablets (25-50 mg total) by mouth at bedtime as needed for sleep. Patient not taking: Reported on 01/20/2018 09/03/17   Lorre Munroe, NP    Family History Family History  Problem Relation Age of Onset  . Anxiety disorder Brother   . Cancer Neg Hx   . Diabetes Neg Hx   . Stroke Neg Hx   . Heart disease Neg Hx     Social History Social History   Tobacco Use  . Smoking status: Former Smoker    Types: Cigarettes  . Smokeless tobacco: Never Used  . Tobacco comment: quit 2013  Substance Use Topics  . Alcohol use: Yes    Alcohol/week: 0.0 oz    Comment: Pt states he  drinks about 1 beer to a 1/5th of liquor per day,  and his last drink  last nite at 2am  . Drug use: No     Allergies   Patient has no known allergies.   Review of Systems Review of Systems  All other systems reviewed and are negative.    Physical Exam Updated Vital Signs There were no vitals taken for this visit.  Physical Exam  Constitutional: He is oriented to person, place, and time. He appears well-developed and well-nourished.  Non-toxic appearance. No distress.  HENT:  Head: Normocephalic and atraumatic.  Eyes: Pupils are equal, round, and reactive to light. Conjunctivae, EOM and lids are normal.  Neck: Normal range of motion. Neck supple. No tracheal deviation present. No thyroid mass present.  Cardiovascular: Regular rhythm and  normal heart sounds. Tachycardia present. Exam reveals no gallop.  No murmur heard. Pulmonary/Chest: Effort normal and breath sounds normal. No stridor. No respiratory distress. He has no decreased breath sounds. He has no wheezes. He has no rhonchi. He has no rales.  Abdominal: Soft. Normal appearance and bowel sounds are normal. He exhibits no distension. There is no tenderness. There is no rebound and no CVA tenderness.  Musculoskeletal: Normal range of motion. He exhibits no edema or tenderness.  Neurological: He is alert and oriented to person, place, and time. He has normal strength. No cranial nerve deficit or sensory deficit. GCS eye subscore is 4. GCS verbal subscore is 5. GCS motor subscore is 6.  Skin: Skin is warm and dry. No abrasion and no rash noted.  Psychiatric: His speech is delayed. He is withdrawn. He is not actively hallucinating. He exhibits a depressed mood. He expresses suicidal ideation. He expresses no suicidal plans.  Nursing note and vitals reviewed.    ED Treatments / Results  Labs (all labs ordered are listed, but only abnormal results are displayed) Labs Reviewed  ETHANOL  RAPID URINE DRUG SCREEN, HOSP PERFORMED  SALICYLATE LEVEL  ACETAMINOPHEN LEVEL  CBC WITH DIFFERENTIAL/PLATELET  COMPREHENSIVE METABOLIC PANEL    EKG EKG Interpretation  Date/Time:  Tuesday March 25 2018 21:15:28 EDT Ventricular Rate:  126 PR Interval:    QRS Duration: 102 QT Interval:  311 QTC Calculation: 451 R Axis:   -60 Text Interpretation:  Sinus tachycardia Left anterior fascicular block Abnormal R-wave progression, late transition Baseline wander in lead(s) V3 Confirmed by [name] ([id number]) on 03/25/2018 9:25:03 PM   Radiology No results found.  Procedures Procedures (including critical care time)  Medications Ordered in ED Medications  LORazepam (ATIVAN) injection 0-4 mg (has no administration in time range)    Or  LORazepam (ATIVAN) tablet 0-4 mg (has no  administration in time range)  LORazepam (ATIVAN) injection 0-4 mg (has no administration in time range)    Or  LORazepam (ATIVAN) tablet 0-4 mg (has no administration in time range)  thiamine (VITAMIN B-1) tablet 100 mg (has no administration in time range)    Or  thiamine (B-1) injection 100 mg (has no administration in time range)  alum & mag hydroxide-simeth (MAALOX/MYLANTA) 200-200-20 MG/5ML suspension 30 mL (has no administration in time range)  ondansetron (ZOFRAN) tablet 4 mg (has no administration in time range)  ibuprofen (ADVIL,MOTRIN) tablet 600 mg (has no administration in time range)  lisinopril (PRINIVIL,ZESTRIL) tablet 20 mg (has no administration in time range)     Initial Impression / Assessment and Plan / ED Course  I have reviewed the triage vital signs and the nursing notes.  Pertinent labs & imaging results that were available during my care of the patient were reviewed by me and considered in my medical decision making (see chart for details).     Patient has sinus tachycardia on his EKG likely from his acute alcohol intoxication.  Will monitor.  Patient is able to carry on a conversation at this time.  Alcohol level 318.  Behavioral health service has been consulted for admission  Final Clinical Impressions(s) / ED Diagnoses   Final diagnoses:  None    ED Discharge Orders    None       Lorre NickAllen, Vicent Febles, MD 03/25/18 2216

## 2018-03-26 DIAGNOSIS — F10129 Alcohol abuse with intoxication, unspecified: Secondary | ICD-10-CM

## 2018-03-26 DIAGNOSIS — F419 Anxiety disorder, unspecified: Secondary | ICD-10-CM

## 2018-03-26 DIAGNOSIS — R450 Nervousness: Secondary | ICD-10-CM

## 2018-03-26 DIAGNOSIS — F329 Major depressive disorder, single episode, unspecified: Secondary | ICD-10-CM

## 2018-03-26 DIAGNOSIS — F1390 Sedative, hypnotic, or anxiolytic use, unspecified, uncomplicated: Secondary | ICD-10-CM

## 2018-03-26 DIAGNOSIS — Y908 Blood alcohol level of 240 mg/100 ml or more: Secondary | ICD-10-CM

## 2018-03-26 DIAGNOSIS — Z87891 Personal history of nicotine dependence: Secondary | ICD-10-CM

## 2018-03-26 DIAGNOSIS — F1014 Alcohol abuse with alcohol-induced mood disorder: Secondary | ICD-10-CM

## 2018-03-26 MED ORDER — LORAZEPAM 1 MG PO TABS
1.0000 mg | ORAL_TABLET | Freq: Once | ORAL | Status: AC
  Administered 2018-03-26: 1 mg via ORAL
  Filled 2018-03-26: qty 1

## 2018-03-26 MED ORDER — GABAPENTIN 300 MG PO CAPS
300.0000 mg | ORAL_CAPSULE | Freq: Three times a day (TID) | ORAL | Status: DC
  Administered 2018-03-26: 300 mg via ORAL
  Filled 2018-03-26: qty 1

## 2018-03-26 MED ORDER — GABAPENTIN 300 MG PO CAPS: 300.0000 mg | ORAL_CAPSULE | Freq: Three times a day (TID) | ORAL | 0 refills | Status: DC

## 2018-03-26 NOTE — Consult Note (Addendum)
[place] [facility] Consult   Reason for Consult:  Alcohol intoxication Referring Physician:  EDP Patient Identification: Coyt Govoni MRN:  [id number] Principal Diagnosis: Alcohol abuse with alcohol-induced mood disorder [facility]) Diagnosis:   Patient Active Problem List   Diagnosis Date Noted  . Alcohol abuse with alcohol-induced mood disorder ([place]) [F10.14] 12/11/2017  . Alcohol abuse [F10.10] 11/26/2017  . Alcohol abuse with intoxication ([place]) [F10.129] 11/26/2017  . Alcohol withdrawal ([place]) [F10.239] 11/26/2017  . GERD (gastroesophageal reflux disease) [K21.9] 11/26/2017  . Abnormal LFTs [R94.5] 11/26/2017  . Abdominal pain [R10.9] 11/26/2017  . Insomnia [G47.00] 07/07/2015  . Anxiety and depression [F41.9, F32.9]   . Hypertension [I10] 07/10/2012    Total Time spent with patient: 45 minutes  Subjective:   Makayla Confer is a 47 y.o. male patient admitted with alcohol intoxication and depression.  HPI:  Pt was seen and chart reviewed with treatment team and Dr Mariea Clonts. Pt denies suicidal/homicidal ideation, denies auditory/visual hallucinations and does not appear to be responding to internal stimuli. Pt stated he has a problem with alcohol and started drinking at age 66. Pt's BAL 318, UDS positive for benzos. Pt stated he has been to rehab, last time was in Feb 2019 at [facility]. Pt does not believe returning to rehab is the answer and wishes to speak to Peer Support for additional resources for substance abuse treatment. Pt stated he just started seeing a therapist and his wife is going to [facility]. Pt has good insight and wants to seek help for his addiction. 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 Pt stated he attends church. Pt denies withdrawal symptoms such as DT's and seizures but is experiencing some sweating and hand tremors. Pt was placed ojn a CIWA protocol on admission and will be given a script for Gabapentin on discharge to help with alcohol withdrawal. Pt appears  sad and depressed. Pt is psychiatrically clear for discharge.   Past Psychiatric History: As above  Risk to Self: None Risk to Others: None Prior Inpatient Therapy: Prior Inpatient Therapy: Yes Prior Therapy Dates: multple most recent 02/12/2018 Prior Therapy Facilty/Provider(s): [facility] Reason for Treatment: alcohol use and depression Prior Outpatient Therapy: Prior Outpatient Therapy: No Does patient have an ACCT team?: No Does patient have Intensive In-House Services?  : No Does patient have [facility] services? : No Does patient have P4CC services?: No  Past Medical History:  Past Medical History:  Diagnosis Date  . Alcohol abuse   . Depression   . Hypertension    No past surgical history on file. Family History:  Family History  Problem Relation Age of Onset  . Anxiety disorder Brother   . Cancer Neg Hx   . Diabetes Neg Hx   . Stroke Neg Hx   . Heart disease Neg Hx    Family Psychiatric  History: Unknown Social History:  Social History   Substance and Sexual Activity  Alcohol Use Yes  . Alcohol/week: 0.0 oz   Comment: Pt states he drinks about 1 beer to a 1/5th of liquor per day,  and his last drink  last nite at 2am     Social History   Substance and Sexual Activity  Drug Use No    Social History   Socioeconomic History  . Marital status: Married    Spouse name: Not on file  . Number of children: Not on file  . Years of education: Not on file  . Highest education level: Not on file  Occupational History  . Not on file  Social Needs  . Financial resource strain: Not on file  . Food insecurity:    Worry: Not on file    Inability: Not on file  . Transportation needs:    Medical: Not on file    Non-medical: Not on file  Tobacco Use  . Smoking status: Former Smoker    Types: Cigarettes  . Smokeless tobacco: Never Used  . Tobacco comment: quit 2013  Substance and Sexual Activity  . Alcohol use: Yes    Alcohol/week: 0.0 oz    Comment: Pt  states he drinks about 1 beer to a 1/5th of liquor per day,  and his last drink  last nite at 2am  . Drug use: No  . Sexual activity: Yes  Lifestyle  . Physical activity:    Days per week: Not on file    Minutes per session: Not on file  . Stress: Not on file  Relationships  . Social connections:    Talks on phone: Not on file    Gets together: Not on file    Attends religious service: Not on file    Active member of club or organization: Not on file    Attends meetings of clubs or organizations: Not on file    Relationship status: Not on file  Other Topics Concern  . Not on file  Social History Narrative  . Not on file   Additional Social History: N/A    Allergies:  No Known Allergies  Labs:  Results for orders placed or performed during the hospital encounter of 03/25/18 (from the past 48 hour(s))  Ethanol     Status: Abnormal   Collection Time: 03/25/18  8:30 PM  Result Value Ref Range   Alcohol, Ethyl (B) 318 (HH) <10 mg/dL    Comment:        LOWEST DETECTABLE LIMIT FOR SERUM ALCOHOL IS 10 mg/dL FOR MEDICAL PURPOSES ONLY CRITICAL RESULT CALLED TO, READ BACK BY AND VERIFIED WITH: MILNER,Q. RN _0  ON 04.02.19 BY COHEN,K Performed at [facility] [address] [place], [place] [address]   Rapid urine drug screen (hospital performed)     Status: Abnormal   Collection Time: 03/25/18  8:30 PM  Result Value Ref Range   Opiates NONE DETECTED NONE DETECTED   Cocaine NONE DETECTED NONE DETECTED   Benzodiazepines POSITIVE (A) NONE DETECTED   Amphetamines NONE DETECTED NONE DETECTED   Tetrahydrocannabinol NONE DETECTED NONE DETECTED   Barbiturates NONE DETECTED NONE DETECTED    Comment: (NOTE) DRUG SCREEN FOR MEDICAL PURPOSES ONLY.  IF CONFIRMATION IS NEEDED FOR ANY PURPOSE, NOTIFY LAB WITHIN 5 DAYS. LOWEST DETECTABLE LIMITS FOR URINE DRUG SCREEN Drug Class                     Cutoff (ng/mL) Amphetamine and metabolites    1000 Barbiturate and  metabolites    200 Benzodiazepine                 528 Tricyclics and metabolites     300 Opiates and metabolites        300 Cocaine and metabolites        300 THC                            50 Performed at [facility], [place] [address] [place], [place] [address]   Salicylate level  Status: None   Collection Time: 03/25/18  8:30 PM  Result Value Ref Range   Salicylate Lvl <0.7 2.8 - 30.0 mg/dL    Comment: Performed at [facility], [place] [address], [place], [place] [address]  Acetaminophen level     Status: Abnormal   Collection Time: 03/25/18  8:30 PM  Result Value Ref Range   Acetaminophen (Tylenol), Serum <10 (L) 10 - 30 ug/mL    Comment:        THERAPEUTIC CONCENTRATIONS VARY SIGNIFICANTLY. A RANGE OF 10-30 ug/mL MAY BE AN EFFECTIVE CONCENTRATION FOR MANY PATIENTS. HOWEVER, SOME ARE BEST TREATED AT CONCENTRATIONS OUTSIDE THIS RANGE. ACETAMINOPHEN CONCENTRATIONS >150 ug/mL AT 4 HOURS AFTER INGESTION AND >50 ug/mL AT 12 HOURS AFTER INGESTION ARE OFTEN ASSOCIATED WITH TOXIC REACTIONS. Performed at [facility], [place] [address] [place], [place] [address]   CBC with Differential/Platelet     Status: Abnormal   Collection Time: 03/25/18  8:30 PM  Result Value Ref Range   WBC 6.5 4.0 - 10.5 K/uL   RBC 5.45 4.22 - 5.81 MIL/uL   Hemoglobin 18.8 (H) 13.0 - 17.0 g/dL   HCT 53.5 (H) 39.0 - 52.0 %   MCV 98.2 78.0 - 100.0 fL   MCH 34.5 (H) 26.0 - 34.0 pg   MCHC 35.1 30.0 - 36.0 g/dL   RDW 13.9 11.5 - 15.5 %   Platelets 156 150 - 400 K/uL   Neutrophils Relative % 48 %   Neutro Abs 3.2 1.7 - 7.7 K/uL   Lymphocytes Relative 44 %   Lymphs Abs 2.8 0.7 - 4.0 K/uL   Monocytes Relative 5 %   Monocytes Absolute 0.3 0.1 - 1.0 K/uL   Eosinophils Relative 3 %   Eosinophils Absolute 0.2 0.0 - 0.7 K/uL   Basophils Relative 0 %   Basophils Absolute 0.0 0.0 - 0.1 K/uL    Comment: Performed at [facility], [place] [address] [place], [place] [address]  Comprehensive metabolic panel     Status: Abnormal   Collection Time: 03/25/18  8:30 PM  Result Value Ref Range   Sodium 148 (H) 135 - 145 mmol/L   Potassium 3.9 3.5 - 5.1 mmol/L   Chloride 109 101 - 111 mmol/L   CO2 26 22 - 32 mmol/L   Glucose, Bld 155 (H) 65 - 99 mg/dL   BUN 11 6 - 20 mg/dL   Creatinine, Ser 0.73 0.61 - 1.24 mg/dL   Calcium 9.0 8.9 - 10.3 mg/dL   Total Protein 7.1 6.5 - 8.1 g/dL   Albumin 3.6 3.5 - 5.0 g/dL   AST 104 (H) 15 - 41 U/L   ALT 97 (H) 17 - 63 U/L   Alkaline Phosphatase 87 38 - 126 U/L   Total Bilirubin 0.9 0.3 - 1.2 mg/dL   GFR calc non Af Amer >60 >60 mL/min   GFR calc Af Amer >60 >60 mL/min    Comment: (NOTE) The eGFR has been calculated using the CKD EPI equation. This calculation has not been validated in all clinical situations. eGFR's persistently <60 mL/min signify possible Chronic Kidney Disease.    Anion gap 13 5 - 15    Comment: Performed at [facility], [place] [address], [place], [place] [address]    Current Facility-Administered Medications  Medication Dose Route Frequency Provider Last Rate Last Dose  . alum & mag hydroxide-simeth (MAALOX/MYLANTA) 200-200-20 MG/5ML suspension 30 mL  30 mL Oral Q6H PRN Lacretia Leigh,  MD      . gabapentin (NEURONTIN) capsule 300 mg  300 mg Oral TID Buford Dresser J, DO   300 mg at 03/26/18 1147  . ibuprofen (ADVIL,MOTRIN) tablet 600 mg  600 mg Oral Q8H PRN Lacretia Leigh, MD      . lisinopril (PRINIVIL,ZESTRIL) tablet 20 mg  20 mg Oral QHS Lacretia Leigh, MD   20 mg at 03/25/18 2236  . LORazepam (ATIVAN) injection 0-4 mg  0-4 mg Intravenous Q6H Lacretia Leigh, MD       Or  . LORazepam (ATIVAN) tablet 0-4 mg  0-4 mg Oral Q6H Lacretia Leigh, MD   1 mg at 03/26/18 1610  . [START ON 03/28/2018] LORazepam (ATIVAN) injection 0-4 mg  0-4 mg Intravenous Q12H Lacretia Leigh, MD       Or  . Derrill Memo ON 03/28/2018] LORazepam (ATIVAN) tablet 0-4 mg  0-4 mg  Oral Q12H Lacretia Leigh, MD      . ondansetron [facility]) tablet 4 mg  4 mg Oral Q8H PRN Lacretia Leigh, MD   4 mg at 03/25/18 2236  . thiamine (VITAMIN B-1) tablet 100 mg  100 mg Oral Daily Lacretia Leigh, MD   100 mg at 03/26/18 9604   Or  . thiamine (B-1) injection 100 mg  100 mg Intravenous Daily Lacretia Leigh, MD       Current Outpatient Medications  Medication Sig Dispense Refill  . citalopram (CELEXA) 20 MG tablet Take 20 mg by mouth daily.    Marland Kitchen ibuprofen (ADVIL,MOTRIN) 200 MG tablet Take 400 mg by mouth every 6 (six) hours as needed for headache.    . lisinopril (PRINIVIL,ZESTRIL) 20 MG tablet Take 1 tablet (20 mg total) by mouth at bedtime. 30 tablet 0  . chlordiazePOXIDE (LIBRIUM) 25 MG capsule 90m PO TID x 1D, then 25-546mPO BID X 1D, then 25-5043mO QD X 1D (Patient not taking: Reported on 03/25/2018) 10 capsule 0  . folic acid (FOLVITE) 1 MG tablet Take 1 tablet (1 mg total) by mouth daily. (Patient not taking: Reported on 03/25/2018) 30 tablet 0  . thiamine 100 MG tablet Take 1 tablet (100 mg total) by mouth daily. (Patient not taking: Reported on 01/20/2018) 30 tablet 0  . traZODone (DESYREL) 50 MG tablet Take 0.5-1 tablets (25-50 mg total) by mouth at bedtime as needed for sleep. (Patient not taking: Reported on 01/20/2018) 30 tablet 2    Musculoskeletal: Strength & Muscle Tone: within normal limits Gait & Station: normal Patient leans: N/A  Psychiatric Specialty Exam: Physical Exam  Nursing note and vitals reviewed. Constitutional: He is oriented to person, place, and time. He appears well-developed and well-nourished.  HENT:  Head: Normocephalic and atraumatic.  Neck: Normal range of motion.  Respiratory: Effort normal.  Neurological: He is alert and oriented to person, place, and time.  Psychiatric: His speech is normal and behavior is normal. Judgment and thought content normal. Cognition and memory are normal. He exhibits a depressed mood.    Review of Systems   Psychiatric/Behavioral: Positive for depression and substance abuse. Negative for hallucinations and suicidal ideas. The patient is nervous/anxious. The patient does not have insomnia.   All other systems reviewed and are negative.   Blood pressure 124/63, pulse 90, temperature 98.2 F (36.8 C), temperature source Oral, resp. rate 16, SpO2 90 %.There is no height or weight on file to calculate BMI.  General Appearance: Casual  Eye Contact:  Good  Speech:  Clear and Coherent  Volume:  Normal  Mood:  Depressed  Affect:  Congruent and Depressed  Thought Process:  Coherent, Goal Directed and Linear  Orientation:  Full (Time, Place, and Person)  Thought Content:  Logical  Suicidal Thoughts:  No  Homicidal Thoughts:  No  Memory:  Immediate;   Good Recent;   Good Remote;   Fair  Judgement:  Fair  Insight:  Fair  Psychomotor Activity:  Normal  Concentration:  Concentration: Good and Attention Span: Good  Recall:  Good  Fund of Knowledge:  Good  Language:  Good  Akathisia:  No  Handed:  Right  AIMS (if indicated):   N/A  Assets:  Agricultural consultant Housing Intimacy Resilience Social Support  ADL's:  Intact  Cognition:  WNL  Sleep:   N/A     Treatment Plan Summary: Plan Alcohol abuse with alcohol-induced mood disorder ([place])  Discharge Home Take all medications as prescribed Avoid the use of alcohol and illicit drugs Follow up with Peer Support in the community.   Disposition: No evidence of imminent risk to self or others at present.   Patient does not meet criteria for psychiatric inpatient admission. Supportive therapy provided about ongoing stressors. Discussed crisis plan, support from social network, calling 911, coming to the Emergency Department, and calling Suicide Hotline.  Ethelene Hal, NP 03/26/2018 11:48 AM   Patient seen face-to-face for psychiatric evaluation, chart reviewed and case discussed with the physician  extender and developed treatment plan. Reviewed the information documented and agree with the treatment plan.  Buford Dresser, DO 03/26/18 7:06 PM

## 2018-03-26 NOTE — Discharge Instructions (Signed)
To help you maintain a sober lifestyle, a substance abuse treatment program may be beneficial to you.  Contact [facility] at your earliest opportunity to ask about enrolling in their program: ° °     [facility] °     [address] °     [place], [place] [address] °     [phone] °     New patients are seen at the walk-in clinic every Tuesday from 9:00 am - 12:00 pm °

## 2018-03-26 NOTE — Patient Outreach (Signed)
CPSS met with the patient and provided substance use recovery support. CPSS met with the patient in the ED on 12/11/18 and has stayed in contact with the patient with community follow ups. Patient currently lives in an [place] in [place] and goes to [employer]. Patient states that he is interested in finding an outpatient substance use treatment center. CPSS will provide outpatient information and CPSS contact information.

## 2018-03-26 NOTE — ED Notes (Signed)
Pt admitted to room #41. Pt behavior cooperative, pleasant on approach. Pt reports passive SI without plan. Pt reports his alcohol abuse is having a negative impact on his marriage. Reports drinking daily. CIWA protocol in place. Pt denies AVH. Pt oriented to unit, encouragement and support provided. Special checks q 15 mins in place for safety, Video monitoring in place. Will continue to monitor.

## 2018-03-26 NOTE — ED Notes (Signed)
Pt wanded and had VS updated, ambulated to room 41. Morrie SheldonAshley RN updated by reviewing chart.

## 2018-03-26 NOTE — BH Assessment (Signed)
[facility] Assessment Progress Note  Per Jacqueline Norman, DO, this pt does not require psychiatric hospitalization at this time.  Pt is to be discharged from [facility] with recommendation to follow up with [facility].  This has been included in pt's discharge instructions.  Pt would also benefit from seeing Peer Support Specialists; they will be asked to speak to pt.  Pt's nurse, Ashley, has been notified.  Nicholas Leer, MA Triage Specialist [phone]     

## 2018-03-26 NOTE — ED Notes (Signed)
Pt d/c home per MD order. Discharge summary reviewed with pt, pt verbalizes understanding. Pt denies SI/HI/AVH. RX provided. Pt signed e-signature. Personal property returned to pt. Pt ambulatory off unit.

## 2018-03-26 NOTE — ED Notes (Signed)
Pt ambulating in room, steady on feet, alert and oriented.

## 2018-03-27 ENCOUNTER — Telehealth: Payer: Self-pay | Admitting: *Deleted

## 2018-03-27 NOTE — Telephone Encounter (Signed)
Lm requesting return call to complete TCM and confirm hosp f/u appt  

## 2018-03-28 NOTE — Telephone Encounter (Signed)
I have not seen him in a few years. If you would like to call him and see if he has not established care anywhere else, if he would like to make a hospital follow up here.

## 2018-03-28 NOTE — Telephone Encounter (Signed)
Nicholas Price,  [facility] due to suicidal ideation and alcohol intoxification.   At this point there are no orders for PCP follow up and appears that he may be going to another office due to cost concerns (per last my chart communication).  Please let me know how you would like to proceed with patient post discharge.    Thanks.

## 2018-04-08 NOTE — Telephone Encounter (Signed)
Spoke with patient earlier today.  He does want to stay with Nicki Reaperegina Baity, NP as PCP.  He is unable to make appointment right now because he needs to coordinate with his boss.  He has asked that we refill his Celexa and Lisinopril as he has very few left.  Nicki Reaperegina Baity, NP has not filled lisinopril in several months and never has r/x'ed the Celexa, patient will need to come in and be seen before r/x's can be given.  LM on patient's cell VM that he will need to make appt before anything can be done.  Patient to call back and set up appointment.

## 2018-04-09 NOTE — Telephone Encounter (Signed)
No refills until he is seen

## 2018-10-28 ENCOUNTER — Encounter (HOSPITAL_COMMUNITY): Payer: Self-pay | Admitting: Emergency Medicine

## 2018-10-28 ENCOUNTER — Emergency Department (HOSPITAL_COMMUNITY)
Admission: EM | Admit: 2018-10-28 | Discharge: 2018-10-28 | Disposition: A | Discharge status: Home / Self Care | Payer: Self-pay | Attending: Emergency Medicine | Admitting: Emergency Medicine

## 2018-10-28 DIAGNOSIS — F1010 Alcohol abuse, uncomplicated: Secondary | ICD-10-CM | POA: Insufficient documentation

## 2018-10-28 DIAGNOSIS — I10 Essential (primary) hypertension: Secondary | ICD-10-CM | POA: Insufficient documentation

## 2018-10-28 DIAGNOSIS — Z87891 Personal history of nicotine dependence: Secondary | ICD-10-CM | POA: Insufficient documentation

## 2018-10-28 DIAGNOSIS — Z79899 Other long term (current) drug therapy: Secondary | ICD-10-CM | POA: Insufficient documentation

## 2018-10-28 MED ORDER — CHLORDIAZEPOXIDE HCL 25 MG PO CAPS: ORAL_CAPSULE | ORAL | 0 refills | Status: DC

## 2018-10-28 NOTE — ED Triage Notes (Signed)
Pt wanting detox from ETOH, last drink was hour ago. Reports drinks 5th liquor per day.

## 2018-10-28 NOTE — ED Provider Notes (Signed)
[facility]-EMERGENCY DEPT Provider Note   CSN: [id number] Arrival date & time: 10/28/18  1211     History   Chief Complaint Chief Complaint  Patient presents with  . detox    HPI Nicholas Price is a 47 y.o. male.  HPI Patient is a 47 year old male with a long-standing history of intermittent alcohol abuse.  He presents the emergency department worsening drinking over the past 6 weeks.  He is requesting alcohol detox.  He is spoke with a halfway house and states he is been accepted there as long as he can "detox".  No suicidal thoughts.  Historically drinks alcohol to deal with anxiety.  He has had successful detox and periods of time where he has not used alcohol.   Past Medical History:  Diagnosis Date  . Alcohol abuse   . Depression   . Hypertension     Patient Active Problem List   Diagnosis Date Noted  . Alcohol abuse with alcohol-induced mood disorder ([facility]) 12/11/2017  . Alcohol abuse 11/26/2017  . Alcohol abuse with intoxication ([facility]) 11/26/2017  . Alcohol withdrawal ([facility]) 11/26/2017  . GERD (gastroesophageal reflux disease) 11/26/2017  . Abnormal LFTs 11/26/2017  . Abdominal pain 11/26/2017  . Insomnia 07/07/2015  . Anxiety and depression   . Hypertension 07/10/2012    History reviewed. No pertinent surgical history.      Home Medications    Prior to Admission medications   Medication Sig Start Date End Date Taking? Authorizing Provider  chlordiazePOXIDE (LIBRIUM) 25 MG capsule 50mg  PO TID x 1D, then 25-50mg  PO BID X 1D, then 25-50mg  PO QD X 1D 10/28/18   Azalia Bilis, MD  citalopram (CELEXA) 20 MG tablet Take 20 mg by mouth daily.    [provider]  folic acid (FOLVITE) 1 MG tablet Take 1 tablet (1 mg total) by mouth daily. Patient not taking: Reported on 03/25/2018 11/30/17   Albertine Grates, MD  gabapentin (NEURONTIN) 300 MG capsule Take 1 capsule (300 mg total) by mouth 3 (three) times daily. 03/26/18   Laveda Abbe, NP    ibuprofen (ADVIL,MOTRIN) 200 MG tablet Take 400 mg by mouth every 6 (six) hours as needed for headache.    [provider]  lisinopril (PRINIVIL,ZESTRIL) 20 MG tablet Take 1 tablet (20 mg total) by mouth at bedtime. 11/29/17   Albertine Grates, MD  thiamine 100 MG tablet Take 1 tablet (100 mg total) by mouth daily. Patient not taking: Reported on 01/20/2018 11/30/17   Albertine Grates, MD  traZODone (DESYREL) 50 MG tablet Take 0.5-1 tablets (25-50 mg total) by mouth at bedtime as needed for sleep. Patient not taking: Reported on 01/20/2018 09/03/17   Lorre Munroe, NP    Family History Family History  Problem Relation Age of Onset  . Anxiety disorder Brother   . Cancer Neg Hx   . Diabetes Neg Hx   . Stroke Neg Hx   . Heart disease Neg Hx     Social History Social History   Tobacco Use  . Smoking status: Former Smoker    Types: Cigarettes  . Smokeless tobacco: Never Used  . Tobacco comment: quit 2013  Substance Use Topics  . Alcohol use: Yes    Comment: Pt states he drinks about 1 beer to a 1/5th of liquor per day,  and his last drink  last nite at 2am  . Drug use: No     Allergies   Patient has no known allergies.   Review  of Systems Review of Systems  All other systems reviewed and are negative.    Physical Exam Updated Vital Signs BP (!) 137/98 (BP Location: Left Arm)   Pulse (!) 115   Temp 98.8 F (37.1 C) (Oral)   Resp 18   SpO2 97%   Physical Exam  Constitutional: He is oriented to person, place, and time. He appears well-developed and well-nourished.  HENT:  Head: Normocephalic and atraumatic.  Eyes: EOM are normal.  Neck: Normal range of motion.  Pulmonary/Chest: Effort normal.  Abdominal: He exhibits no distension.  Musculoskeletal: Normal range of motion.  Neurological: He is alert and oriented to person, place, and time.  Skin: Skin is warm.  Psychiatric: He has a normal mood and affect.  Nursing note and vitals reviewed.    ED Treatments /  Results  Labs (all labs ordered are listed, but only abnormal results are displayed) Labs Reviewed - No data to display  EKG None  Radiology No results found.  Procedures Procedures (including critical care time)  Medications Ordered in ED Medications - No data to display   Initial Impression / Assessment and Plan / ED Course  I have reviewed the triage vital signs and the nursing notes.  Pertinent labs & imaging results that were available during my care of the patient were reviewed by me and considered in my medical decision making (see chart for details).     Outpatient referrals given.  Home with a prescription for Librium for detox at home.  Final Clinical Impressions(s) / ED Diagnoses   Final diagnoses:  Alcohol abuse    ED Discharge Orders         Ordered    chlordiazePOXIDE (LIBRIUM) 25 MG capsule     10/28/18 1432           Azalia Bilis, MD 10/28/18 1433

## 2018-12-01 ENCOUNTER — Emergency Department (HOSPITAL_COMMUNITY)
Admission: EM | Admit: 2018-12-01 | Discharge: 2018-12-02 | Disposition: A | Discharge status: Other Acute Inpatient Hospital | Payer: Medicaid Other | Attending: Emergency Medicine | Admitting: Emergency Medicine

## 2018-12-01 ENCOUNTER — Encounter (HOSPITAL_COMMUNITY): Payer: Self-pay

## 2018-12-01 DIAGNOSIS — R45851 Suicidal ideations: Secondary | ICD-10-CM | POA: Insufficient documentation

## 2018-12-01 DIAGNOSIS — Z87891 Personal history of nicotine dependence: Secondary | ICD-10-CM | POA: Insufficient documentation

## 2018-12-01 DIAGNOSIS — Y906 Blood alcohol level of 120-199 mg/100 ml: Secondary | ICD-10-CM | POA: Diagnosis not present

## 2018-12-01 DIAGNOSIS — F10120 Alcohol abuse with intoxication, uncomplicated: Secondary | ICD-10-CM | POA: Insufficient documentation

## 2018-12-01 DIAGNOSIS — F329 Major depressive disorder, single episode, unspecified: Secondary | ICD-10-CM | POA: Diagnosis not present

## 2018-12-01 DIAGNOSIS — R600 Localized edema: Secondary | ICD-10-CM | POA: Insufficient documentation

## 2018-12-01 DIAGNOSIS — I4891 Unspecified atrial fibrillation: Secondary | ICD-10-CM | POA: Insufficient documentation

## 2018-12-01 DIAGNOSIS — F1014 Alcohol abuse with alcohol-induced mood disorder: Secondary | ICD-10-CM | POA: Diagnosis present

## 2018-12-01 DIAGNOSIS — I10 Essential (primary) hypertension: Secondary | ICD-10-CM | POA: Diagnosis not present

## 2018-12-01 DIAGNOSIS — F1010 Alcohol abuse, uncomplicated: Secondary | ICD-10-CM

## 2018-12-01 HISTORY — DX: Unspecified atrial fibrillation: I48.91

## 2018-12-01 LAB — COMPREHENSIVE METABOLIC PANEL
ALK PHOS: 90 U/L (ref 38–126)
ALT: 117 U/L — AB (ref 0–44)
AST: 97 U/L — AB (ref 15–41)
Albumin: 3.3 g/dL — ABNORMAL LOW (ref 3.5–5.0)
Anion gap: 11 (ref 5–15)
BUN: 6 mg/dL (ref 6–20)
CALCIUM: 9.1 mg/dL (ref 8.9–10.3)
CO2: 28 mmol/L (ref 22–32)
CREATININE: 1.06 mg/dL (ref 0.61–1.24)
Chloride: 100 mmol/L (ref 98–111)
GFR calc non Af Amer: >60 mL/min (ref >60)
GLUCOSE: 121 mg/dL — AB (ref 70–99)
Potassium: 4.9 mmol/L (ref 3.5–5.1)
SODIUM: 139 mmol/L (ref 135–145)
Total Bilirubin: 1.6 mg/dL — ABNORMAL HIGH (ref 0.3–1.2)
Total Protein: 7.5 g/dL (ref 6.5–8.1)

## 2018-12-01 LAB — RAPID URINE DRUG SCREEN, HOSP PERFORMED
Amphetamines: NOT DETECTED
BENZODIAZEPINES: POSITIVE — AB
Barbiturates: NOT DETECTED
Cocaine: NOT DETECTED
Opiates: NOT DETECTED
TETRAHYDROCANNABINOL: NOT DETECTED

## 2018-12-01 LAB — CBC
HEMATOCRIT: 55.9 % — AB (ref 39.0–52.0)
HEMOGLOBIN: 19 g/dL — AB (ref 13.0–17.0)
MCH: 33.6 pg (ref 26.0–34.0)
MCHC: 34 g/dL (ref 30.0–36.0)
MCV: 98.8 fL (ref 80.0–100.0)
NRBC: 0 % (ref 0.0–0.2)
Platelets: 132 10*3/uL — ABNORMAL LOW (ref 150–400)
RBC: 5.66 MIL/uL (ref 4.22–5.81)
RDW: 12.7 % (ref 11.5–15.5)
WBC: 5.7 10*3/uL (ref 4.0–10.5)

## 2018-12-01 LAB — ETHANOL: Alcohol, Ethyl (B): 151 mg/dL — ABNORMAL HIGH (ref <10)

## 2018-12-01 LAB — SALICYLATE LEVEL

## 2018-12-01 LAB — ACETAMINOPHEN LEVEL: Acetaminophen (Tylenol), Serum: <10 ug/mL — ABNORMAL LOW (ref 10–30)

## 2018-12-01 MED ORDER — THIAMINE HCL 100 MG/ML IJ SOLN: 100.0000 mg | Freq: Every day | INTRAMUSCULAR | Status: DC

## 2018-12-01 MED ORDER — CITALOPRAM HYDROBROMIDE 10 MG PO TABS
20.0000 mg | ORAL_TABLET | Freq: Every day | ORAL | Status: DC
  Administered 2018-12-01: 20 mg via ORAL
  Filled 2018-12-01: qty 2

## 2018-12-01 MED ORDER — ADULT MULTIVITAMIN W/MINERALS CH
1.0000 | ORAL_TABLET | Freq: Every day | ORAL | Status: DC
  Administered 2018-12-01: 1 via ORAL
  Filled 2018-12-01: qty 1

## 2018-12-01 MED ORDER — LISINOPRIL 20 MG PO TABS
20.0000 mg | ORAL_TABLET | Freq: Every day | ORAL | Status: DC
  Administered 2018-12-01: 20 mg via ORAL
  Filled 2018-12-01: qty 1

## 2018-12-01 MED ORDER — FOLIC ACID 1 MG PO TABS
1.0000 mg | ORAL_TABLET | Freq: Every day | ORAL | Status: DC
  Administered 2018-12-01: 1 mg via ORAL
  Filled 2018-12-01: qty 1

## 2018-12-01 MED ORDER — LORAZEPAM 2 MG/ML IJ SOLN: 1.0000 mg | Freq: Four times a day (QID) | INTRAMUSCULAR | Status: DC | PRN

## 2018-12-01 MED ORDER — VITAMIN B-1 100 MG PO TABS
100.0000 mg | ORAL_TABLET | Freq: Every day | ORAL | Status: DC
  Administered 2018-12-01: 100 mg via ORAL
  Filled 2018-12-01: qty 1

## 2018-12-01 MED ORDER — GABAPENTIN 300 MG PO CAPS
300.0000 mg | ORAL_CAPSULE | Freq: Three times a day (TID) | ORAL | Status: DC
  Administered 2018-12-01 (×3): 300 mg via ORAL
  Filled 2018-12-01 (×3): qty 1

## 2018-12-01 MED ORDER — LORAZEPAM 1 MG PO TABS: 0.0000 mg | ORAL_TABLET | Freq: Two times a day (BID) | ORAL | Status: DC

## 2018-12-01 MED ORDER — LORAZEPAM 1 MG PO TABS: 0.0000 mg | ORAL_TABLET | Freq: Four times a day (QID) | ORAL | Status: DC

## 2018-12-01 MED ORDER — LORAZEPAM 1 MG PO TABS: 1.0000 mg | ORAL_TABLET | Freq: Four times a day (QID) | ORAL | Status: DC | PRN

## 2018-12-01 NOTE — ED Provider Notes (Signed)
[facility] EMERGENCY DEPARTMENT Provider Note   CSN: [id number] Arrival date & time: 12/01/18  0056     History   Chief Complaint Chief Complaint  Patient presents with  . Suicidal    HPI Nicholas Price is a 47 y.o. male.  Patient with history of alcohol abuse, atrial fibrillation, depression and hypertension presenting with suicidal thoughts for the past several days.  Has a plan to cut his wrists.  Does not want to hurt anyone else.  No hallucinations.  States he drinks 12-24 beers a day, last drink was 3 hours ago.  He denies any withdrawal symptoms at this time but has had shakes in the past but no seizures.  He wants help to stop drinking as well.  States he has not had his blood pressure medications or Eliquis for several months.  Has had some intermittent diarrhea but no nausea, vomiting, abdominal pain, chest pain or shortness of breath.  Has leg swelling which is at baseline and unchanged.  The history is provided by the patient.    Past Medical History:  Diagnosis Date  . A-fib (HCC)   . Alcohol abuse   . Depression   . Hypertension     Patient Active Problem List   Diagnosis Date Noted  . Alcohol abuse with alcohol-induced mood disorder (HCC) 12/11/2017  . Alcohol abuse 11/26/2017  . Alcohol abuse with intoxication (HCC) 11/26/2017  . Alcohol withdrawal (HCC) 11/26/2017  . GERD (gastroesophageal reflux disease) 11/26/2017  . Abnormal LFTs 11/26/2017  . Abdominal pain 11/26/2017  . Insomnia 07/07/2015  . Anxiety and depression   . Hypertension 07/10/2012    History reviewed. No pertinent surgical history.      Home Medications    Prior to Admission medications   Medication Sig Start Date End Date Taking? Authorizing Provider  chlordiazePOXIDE (LIBRIUM) 25 MG capsule 50mg  PO TID x 1D, then 25-50mg  PO BID X 1D, then 25-50mg  PO QD X 1D 10/28/18   Azalia Bilis, MD  citalopram (CELEXA) 20 MG tablet Take 20 mg by mouth daily.    [provider]  folic acid (FOLVITE) 1 MG tablet Take 1 tablet (1 mg total) by mouth daily. Patient not taking: Reported on 03/25/2018 11/30/17   Albertine Grates, MD  gabapentin (NEURONTIN) 300 MG capsule Take 1 capsule (300 mg total) by mouth 3 (three) times daily. 03/26/18   Laveda Abbe, NP  ibuprofen (ADVIL,MOTRIN) 200 MG tablet Take 400 mg by mouth every 6 (six) hours as needed for headache.    [provider]  lisinopril (PRINIVIL,ZESTRIL) 20 MG tablet Take 1 tablet (20 mg total) by mouth at bedtime. 11/29/17   Albertine Grates, MD  thiamine 100 MG tablet Take 1 tablet (100 mg total) by mouth daily. Patient not taking: Reported on 01/20/2018 11/30/17   Albertine Grates, MD  traZODone (DESYREL) 50 MG tablet Take 0.5-1 tablets (25-50 mg total) by mouth at bedtime as needed for sleep. Patient not taking: Reported on 01/20/2018 09/03/17   Lorre Munroe, NP    Family History Family History  Problem Relation Age of Onset  . Anxiety disorder Brother   . Cancer Neg Hx   . Diabetes Neg Hx   . Stroke Neg Hx   . Heart disease Neg Hx     Social History Social History   Tobacco Use  . Smoking status: Former Smoker    Types: Cigarettes  . Smokeless tobacco: Never Used  . Tobacco comment: quit 2013  Substance Use Topics  . Alcohol use: Yes    Comment: Pt states he drinks about 1 beer to a 1/5th of liquor per day,  and his last drink  last nite at 2am  . Drug use: No     Allergies   Patient has no known allergies.   Review of Systems Review of Systems  Constitutional: Positive for activity change, appetite change and fatigue.  HENT: Negative for congestion.   Respiratory: Negative for cough, chest tightness and shortness of breath.   Cardiovascular: Negative for chest pain.  Gastrointestinal: Negative for abdominal pain, nausea and vomiting.  Genitourinary: Negative for dysuria, hematuria and testicular pain.  Musculoskeletal: Negative for arthralgias and myalgias.  Skin: Negative for  rash.  Neurological: Positive for weakness. Negative for dizziness and headaches.  Psychiatric/Behavioral: Positive for decreased concentration, dysphoric mood, self-injury and suicidal ideas. The patient is nervous/anxious.     all other systems are negative except as noted in the HPI and PMH.    Physical Exam Updated Vital Signs BP (!) 150/99   Pulse (!) 111   Temp 98.1 F (36.7 C) (Oral)   Resp 20   SpO2 94%   Physical Exam  Constitutional: He is oriented to person, place, and time. He appears well-developed and well-nourished. No distress.  Flat affect, calm and cooperative  HENT:  Head: Normocephalic and atraumatic.  Mouth/Throat: Oropharynx is clear and moist. No oropharyngeal exudate.  Eyes: Pupils are equal, round, and reactive to light. Conjunctivae and EOM are normal.  Neck: Normal range of motion. Neck supple.  No meningismus.  Cardiovascular: Normal rate, normal heart sounds and intact distal pulses.  No murmur heard. irregular tachycardia around 110  Pulmonary/Chest: Effort normal and breath sounds normal. No respiratory distress.  Abdominal: Soft. There is no tenderness. There is no rebound and no guarding.  Musculoskeletal: Normal range of motion. He exhibits edema. He exhibits no tenderness.  +1 edema bilaterally  Neurological: He is alert and oriented to person, place, and time. No cranial nerve deficit. He exhibits normal muscle tone. Coordination normal.  No ataxia on finger to nose bilaterally. No pronator drift. 5/5 strength throughout. CN 2-12 intact.Equal grip strength. Sensation intact.   Skin: Skin is warm.  Psychiatric: He has a normal mood and affect. His behavior is normal.  Nursing note and vitals reviewed.    ED Treatments / Results  Labs (all labs ordered are listed, but only abnormal results are displayed) Labs Reviewed  COMPREHENSIVE METABOLIC PANEL - Abnormal; Notable for the following components:      Result Value   Glucose, Bld 121 (*)     Albumin 3.3 (*)    AST 97 (*)    ALT 117 (*)    Total Bilirubin 1.6 (*)    All other components within normal limits  ETHANOL - Abnormal; Notable for the following components:   Alcohol, Ethyl (B) 151 (*)    All other components within normal limits  ACETAMINOPHEN LEVEL - Abnormal; Notable for the following components:   Acetaminophen (Tylenol), Serum <10 (*)    All other components within normal limits  CBC - Abnormal; Notable for the following components:   Hemoglobin 19.0 (*)    HCT 55.9 (*)    Platelets 132 (*)    All other components within normal limits  RAPID URINE DRUG SCREEN, HOSP PERFORMED - Abnormal; Notable for the following components:   Benzodiazepines POSITIVE (*)    All other components within normal limits  SALICYLATE LEVEL  EKG EKG Interpretation  Date/Time:  Monday December 01 2018 01:59:07 EST Ventricular Rate:  94 PR Interval:    QRS Duration: 109 QT Interval:  376 QTC Calculation: 471 R Axis:   -30 Text Interpretation:  Sinus rhythm Left axis deviation Rate slower Confirmed by Glynn Octaveancour, Aryahi Denzler [phone]([id number]) on 12/01/2018 2:04:04 AM   Radiology No results found.  Procedures Procedures (including critical care time)  Medications Ordered in ED Medications  LORazepam (ATIVAN) tablet 1 mg (has no administration in time range)    Or  LORazepam (ATIVAN) injection 1 mg (has no administration in time range)  thiamine (VITAMIN B-1) tablet 100 mg (has no administration in time range)    Or  thiamine (B-1) injection 100 mg (has no administration in time range)  folic acid (FOLVITE) tablet 1 mg (has no administration in time range)  multivitamin with minerals tablet 1 tablet (has no administration in time range)  LORazepam (ATIVAN) tablet 0-4 mg (has no administration in time range)    Followed by  LORazepam (ATIVAN) tablet 0-4 mg (has no administration in time range)     Initial Impression / Assessment and Plan / ED Course  I have reviewed the triage  vital signs and the nursing notes.  Pertinent labs & imaging results that were available during my care of the patient were reviewed by me and considered in my medical decision making (see chart for details).    Patient here with alcohol abuse and suicidal thoughts.  He is calm and cooperative.  Labs will be obtained, patient will be started on alcohol withdrawal protocol.  Labs show alcohol intoxication and transaminitis which appears to be chronic.  TTS consult completed.  Patient will be reevaluated in the morning.  Holding orders placed.  Patient continues to remain calm and cooperative.  Continue CIWA protocol.  Final Clinical Impressions(s) / ED Diagnoses   Final diagnoses:  None    ED Discharge Orders    None       Kristan Brummitt, Jeannett SeniorStephen, MD 12/01/18 [phone]

## 2018-12-01 NOTE — ED Notes (Signed)
Staffing called for sitter, pt changing into paper scrubs

## 2018-12-01 NOTE — Progress Notes (Signed)
Pt meets inpatient criteria per Fransisca KaufmannLaura Davis, NP. Referral information has been sent to the following hospitals for review: [facility]  [facility]  [facility]  [facility]  [facility]  [facility]  [facility]  [facility]  [facility]   Disposition will continue to assist with inpatient placement needs.   Wells GuilesSarah Jordyn Hofacker, LCSW, LCAS Disposition CSW [facility]/TTS [phone]

## 2018-12-01 NOTE — ED Notes (Signed)
Lunch tray ordered 

## 2018-12-01 NOTE — ED Triage Notes (Signed)
Pt states that he drinks 12-24 beers a day, sometimes a fifth of vodka a day, last drink 3 hours ago, pt feeling SI due to losing job and recent separation and homelessness. Pt states that he has intentions to cut himself with a razor. Denies HI/AVH.

## 2018-12-01 NOTE — Consult Note (Signed)
                               Tele-Psych Consultation  Patient is seen and chart is reviewed. The patient continues to endorse suicidal plan to cut his wrists and is unable to contract for his safety outside the hospital. He states "I have lost my job. My mother kicked me out. I can't keep a job because of my health. I'm drinking every day. I have never hurt myself before but I did go so far to get some razor blades with plan to cut my wrist. I can't say if I was not here that I might not try it. I have been to [facility] twice this year and [facility]. I'm just not doing well." The case was discussed with TTS team. Patient meets criteria for inpatient psychiatric admission.  Gerome ApleyLaura A. Earlene Plateravis, NP 12/01/2018 6:23 pm

## 2018-12-01 NOTE — ED Notes (Addendum)
Patient wearing maroon paper scrubs , security wanded pt. , personal belongings inventoried and stored at locker#2 at [facility] , valuables at security safe , plan of care explained by EDP with no objections , sitter requested at arrival .
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
## 2018-12-01 NOTE — ED Notes (Signed)
TTS video interview in progress .  

## 2018-12-01 NOTE — ED Notes (Signed)
Delay in lab draw,  Pt not in room at this time. 

## 2018-12-01 NOTE — ED Notes (Signed)
Patient's sitter arrived .  

## 2018-12-01 NOTE — Progress Notes (Signed)
[facility] bed [facility]2 Dr. Maryelizabeth KaufmannMunoz is the attending provider.   Call report to [phone] [facility].    Pt is voluntary and can be [facility]. [facility] as [facility] as transportation can be arranged.  Wells GuilesSarah Shanay Woolman, LCSW, LCAS Disposition CSW [facility] [phone]

## 2018-12-13 DIAGNOSIS — I4891 Unspecified atrial fibrillation: Secondary | ICD-10-CM | POA: Insufficient documentation

## 2018-12-13 DIAGNOSIS — Z590 Homelessness: Secondary | ICD-10-CM | POA: Insufficient documentation

## 2018-12-13 DIAGNOSIS — F10230 Alcohol dependence with withdrawal, uncomplicated: Secondary | ICD-10-CM | POA: Insufficient documentation

## 2018-12-13 DIAGNOSIS — F332 Major depressive disorder, recurrent severe without psychotic features: Secondary | ICD-10-CM | POA: Insufficient documentation

## 2018-12-13 DIAGNOSIS — Y908 Blood alcohol level of 240 mg/100 ml or more: Secondary | ICD-10-CM | POA: Insufficient documentation

## 2018-12-13 DIAGNOSIS — Z87891 Personal history of nicotine dependence: Secondary | ICD-10-CM | POA: Insufficient documentation

## 2018-12-13 DIAGNOSIS — I10 Essential (primary) hypertension: Secondary | ICD-10-CM | POA: Insufficient documentation

## 2018-12-13 DIAGNOSIS — R45851 Suicidal ideations: Secondary | ICD-10-CM | POA: Insufficient documentation

## 2018-12-14 ENCOUNTER — Encounter (HOSPITAL_COMMUNITY): Payer: Self-pay

## 2018-12-14 ENCOUNTER — Other Ambulatory Visit: Payer: Self-pay

## 2018-12-14 ENCOUNTER — Encounter (HOSPITAL_COMMUNITY): Payer: Self-pay | Admitting: Emergency Medicine

## 2018-12-14 ENCOUNTER — Inpatient Hospital Stay (HOSPITAL_COMMUNITY)
Admission: AD | Admit: 2018-12-14 | Discharge: 2018-12-19 | DRG: 885 | Disposition: A | Discharge status: Home / Self Care | Payer: Medicaid Other | Source: Intra-hospital | Attending: Psychiatry | Admitting: Psychiatry

## 2018-12-14 ENCOUNTER — Emergency Department (HOSPITAL_COMMUNITY)
Admission: EM | Admit: 2018-12-14 | Discharge: 2018-12-14 | Disposition: A | Discharge status: Other Acute Inpatient Hospital | Payer: Medicaid Other | Attending: Emergency Medicine | Admitting: Emergency Medicine

## 2018-12-14 DIAGNOSIS — F10920 Alcohol use, unspecified with intoxication, uncomplicated: Secondary | ICD-10-CM

## 2018-12-14 DIAGNOSIS — I10 Essential (primary) hypertension: Secondary | ICD-10-CM | POA: Diagnosis present

## 2018-12-14 DIAGNOSIS — Y908 Blood alcohol level of 240 mg/100 ml or more: Secondary | ICD-10-CM | POA: Diagnosis not present

## 2018-12-14 DIAGNOSIS — I4891 Unspecified atrial fibrillation: Secondary | ICD-10-CM | POA: Diagnosis present

## 2018-12-14 DIAGNOSIS — F332 Major depressive disorder, recurrent severe without psychotic features: Principal | ICD-10-CM | POA: Diagnosis present

## 2018-12-14 DIAGNOSIS — Z87891 Personal history of nicotine dependence: Secondary | ICD-10-CM

## 2018-12-14 DIAGNOSIS — R000 Tachycardia, unspecified: Secondary | ICD-10-CM | POA: Diagnosis present

## 2018-12-14 DIAGNOSIS — Z818 Family history of other mental and behavioral disorders: Secondary | ICD-10-CM | POA: Diagnosis not present

## 2018-12-14 DIAGNOSIS — Z79899 Other long term (current) drug therapy: Secondary | ICD-10-CM

## 2018-12-14 DIAGNOSIS — F1024 Alcohol dependence with alcohol-induced mood disorder: Secondary | ICD-10-CM | POA: Diagnosis present

## 2018-12-14 DIAGNOSIS — F10230 Alcohol dependence with withdrawal, uncomplicated: Secondary | ICD-10-CM | POA: Diagnosis not present

## 2018-12-14 DIAGNOSIS — Z590 Homelessness: Secondary | ICD-10-CM

## 2018-12-14 DIAGNOSIS — R45851 Suicidal ideations: Secondary | ICD-10-CM

## 2018-12-14 DIAGNOSIS — F419 Anxiety disorder, unspecified: Secondary | ICD-10-CM | POA: Diagnosis present

## 2018-12-14 HISTORY — DX: Anxiety disorder, unspecified: F41.9

## 2018-12-14 LAB — RAPID URINE DRUG SCREEN, HOSP PERFORMED
Amphetamines: NOT DETECTED
Barbiturates: NOT DETECTED
Benzodiazepines: NOT DETECTED
Cocaine: NOT DETECTED
Opiates: NOT DETECTED
Tetrahydrocannabinol: NOT DETECTED

## 2018-12-14 LAB — COMPREHENSIVE METABOLIC PANEL
ALT: 105 U/L — ABNORMAL HIGH (ref 0–44)
AST: 91 U/L — ABNORMAL HIGH (ref 15–41)
Albumin: 3.2 g/dL — ABNORMAL LOW (ref 3.5–5.0)
Alkaline Phosphatase: 72 U/L (ref 38–126)
Anion gap: 13 (ref 5–15)
BUN: 6 mg/dL (ref 6–20)
CALCIUM: 8.8 mg/dL — AB (ref 8.9–10.3)
CO2: 26 mmol/L (ref 22–32)
Chloride: 101 mmol/L (ref 98–111)
Creatinine, Ser: 0.88 mg/dL (ref 0.61–1.24)
GFR calc Af Amer: >60 mL/min (ref >60)
Glucose, Bld: 131 mg/dL — ABNORMAL HIGH (ref 70–99)
Potassium: 4.1 mmol/L (ref 3.5–5.1)
Sodium: 140 mmol/L (ref 135–145)
Total Bilirubin: 1 mg/dL (ref 0.3–1.2)
Total Protein: 7.3 g/dL (ref 6.5–8.1)

## 2018-12-14 LAB — ACETAMINOPHEN LEVEL: Acetaminophen (Tylenol), Serum: <10 ug/mL — ABNORMAL LOW (ref 10–30)

## 2018-12-14 LAB — CBC
HEMATOCRIT: 55.5 % — AB (ref 39.0–52.0)
Hemoglobin: 18.9 g/dL — ABNORMAL HIGH (ref 13.0–17.0)
MCH: 33 pg (ref 26.0–34.0)
MCHC: 34.1 g/dL (ref 30.0–36.0)
MCV: 96.9 fL (ref 80.0–100.0)
Platelets: 156 10*3/uL (ref 150–400)
RBC: 5.73 MIL/uL (ref 4.22–5.81)
RDW: 12.3 % (ref 11.5–15.5)
WBC: 6.3 10*3/uL (ref 4.0–10.5)
nRBC: 0 % (ref 0.0–0.2)

## 2018-12-14 LAB — ETHANOL: Alcohol, Ethyl (B): 288 mg/dL — ABNORMAL HIGH (ref <10)

## 2018-12-14 LAB — SALICYLATE LEVEL: Salicylate Lvl: <7 mg/dL (ref 2.8–30.0)

## 2018-12-14 MED ORDER — LORAZEPAM 1 MG PO TABS
1.0000 mg | ORAL_TABLET | Freq: Once | ORAL | Status: AC
  Administered 2018-12-14: 1 mg via ORAL
  Filled 2018-12-14: qty 1

## 2018-12-14 MED ORDER — MAGNESIUM HYDROXIDE 400 MG/5ML PO SUSP: 30.0000 mL | Freq: Every day | ORAL | Status: DC | PRN

## 2018-12-14 MED ORDER — CHLORDIAZEPOXIDE HCL 25 MG PO CAPS
100.0000 mg | ORAL_CAPSULE | Freq: Once | ORAL | Status: AC
  Administered 2018-12-14: 100 mg via ORAL
  Filled 2018-12-14: qty 4

## 2018-12-14 MED ORDER — LORAZEPAM 2 MG/ML IJ SOLN: 0.0000 mg | Freq: Two times a day (BID) | INTRAMUSCULAR | Status: DC

## 2018-12-14 MED ORDER — ONDANSETRON HCL 4 MG PO TABS: 4.0000 mg | ORAL_TABLET | Freq: Three times a day (TID) | ORAL | Status: DC | PRN

## 2018-12-14 MED ORDER — LORAZEPAM 2 MG/ML IJ SOLN: 1.0000 mg | Freq: Once | INTRAMUSCULAR | Status: DC

## 2018-12-14 MED ORDER — THIAMINE HCL 100 MG/ML IJ SOLN: 100.0000 mg | Freq: Every day | INTRAMUSCULAR | Status: DC

## 2018-12-14 MED ORDER — LISINOPRIL 20 MG PO TABS
20.0000 mg | ORAL_TABLET | Freq: Every day | ORAL | Status: DC
  Administered 2018-12-14 – 2018-12-19 (×6): 20 mg via ORAL
  Filled 2018-12-14 (×8): qty 1
  Filled 2018-12-14: qty 7

## 2018-12-14 MED ORDER — IBUPROFEN 400 MG PO TABS: 600.0000 mg | ORAL_TABLET | Freq: Three times a day (TID) | ORAL | Status: DC | PRN

## 2018-12-14 MED ORDER — TRAZODONE HCL 50 MG PO TABS
50.0000 mg | ORAL_TABLET | Freq: Every evening | ORAL | Status: DC | PRN
  Administered 2018-12-14 – 2018-12-18 (×5): 50 mg via ORAL
  Filled 2018-12-14 (×5): qty 1
  Filled 2018-12-14: qty 7

## 2018-12-14 MED ORDER — CHLORDIAZEPOXIDE HCL 25 MG PO CAPS
25.0000 mg | ORAL_CAPSULE | Freq: Four times a day (QID) | ORAL | Status: DC | PRN
  Administered 2018-12-14 – 2018-12-18 (×6): 25 mg via ORAL
  Filled 2018-12-14 (×7): qty 1

## 2018-12-14 MED ORDER — LORAZEPAM 1 MG PO TABS: 0.0000 mg | ORAL_TABLET | Freq: Two times a day (BID) | ORAL | Status: DC

## 2018-12-14 MED ORDER — LORAZEPAM 2 MG/ML IJ SOLN: 0.0000 mg | Freq: Four times a day (QID) | INTRAMUSCULAR | Status: DC

## 2018-12-14 MED ORDER — VITAMIN B-1 100 MG PO TABS
100.0000 mg | ORAL_TABLET | Freq: Every day | ORAL | Status: DC
  Administered 2018-12-15 – 2018-12-19 (×5): 100 mg via ORAL
  Filled 2018-12-14 (×7): qty 1

## 2018-12-14 MED ORDER — VITAMIN B-1 100 MG PO TABS
100.0000 mg | ORAL_TABLET | Freq: Every day | ORAL | Status: DC
  Administered 2018-12-14: 100 mg via ORAL
  Filled 2018-12-14: qty 1

## 2018-12-14 MED ORDER — ACETAMINOPHEN 325 MG PO TABS: 650.0000 mg | ORAL_TABLET | Freq: Four times a day (QID) | ORAL | Status: DC | PRN

## 2018-12-14 MED ORDER — LORAZEPAM 1 MG PO TABS: 0.0000 mg | ORAL_TABLET | Freq: Four times a day (QID) | ORAL | Status: DC

## 2018-12-14 MED ORDER — METOPROLOL TARTRATE 12.5 MG HALF TABLET
12.5000 mg | ORAL_TABLET | Freq: Two times a day (BID) | ORAL | Status: DC
  Administered 2018-12-14: 12.5 mg via ORAL
  Filled 2018-12-14 (×4): qty 1

## 2018-12-14 MED ORDER — HYDROXYZINE HCL 25 MG PO TABS
25.0000 mg | ORAL_TABLET | Freq: Three times a day (TID) | ORAL | Status: DC | PRN
  Administered 2018-12-14: 25 mg via ORAL
  Filled 2018-12-14: qty 10
  Filled 2018-12-14: qty 1

## 2018-12-14 MED ORDER — LORAZEPAM 1 MG PO TABS
0.0000 mg | ORAL_TABLET | Freq: Four times a day (QID) | ORAL | Status: DC
  Administered 2018-12-14: 2 mg via ORAL
  Filled 2018-12-14: qty 2

## 2018-12-14 MED ORDER — LORAZEPAM 1 MG PO TABS
ORAL_TABLET | ORAL | Status: AC
  Administered 2018-12-14: 18:00:00
  Filled 2018-12-14: qty 1

## 2018-12-14 MED ORDER — LORAZEPAM 1 MG PO TABS: 1.0000 mg | ORAL_TABLET | Freq: Once | ORAL | Status: DC

## 2018-12-14 MED ORDER — ALUM & MAG HYDROXIDE-SIMETH 200-200-20 MG/5ML PO SUSP: 30.0000 mL | ORAL | Status: DC | PRN

## 2018-12-14 MED ORDER — IBUPROFEN 600 MG PO TABS: 600.0000 mg | ORAL_TABLET | Freq: Three times a day (TID) | ORAL | Status: DC | PRN

## 2018-12-14 NOTE — ED Triage Notes (Signed)
Pt reports drinking 12-24 beers/day, last drink x 2 hours ago. Requesting detox. Endorses SI with plan to cut himself with razors.

## 2018-12-14 NOTE — [facility] Suicide Risk Assessment (Signed)
[facility]   Nursing information obtained from:    Demographic factors:    Current Mental Status:    Loss Factors:    Historical Factors:    Risk Reduction Factors:     Total Time spent with patient: 45 minutes Principal Problem: <principal problem not specified> Diagnosis:  Active Problems:   Major depressive disorder, recurrent severe without psychotic features (HCC)  Subjective Data: Patient is seen and examined.  Patient is a 47 year old male with a past psychiatric history significant for alcohol dependence, alcohol induced mood disorder, hypertension, history of atrial fibrillation with anticoagulation who presented to the [facility] emergency department on 12/22 with suicidal ideation.  Patient stated he was going to cut his wrist with razors.  He stated that he has multiple stressors including being separated from his wife for the last 2 years and homeless.  The patient has had multiple hospitalizations for detox, and has been in alcohol habilitation facilities on multiple occasions.  His last psychiatric hospitalization here was in December 2018.  His most recent detox and psychiatric hospitalization was at [facility] [place]  He was admitted there approximately 4 weeks ago.  He remained there for 2 weeks, and [facility] last 2 to 3 weeks.  He had to leave their facility this week because he was unable to make payments for his rent.  Review of his electronic medical record revealed that when he was [facility] during the last year he was diagnosed with atrial fibrillation.  He was placed on Eliquis at that time.  After he was discharged he did not have enough resources to be able to pay for the Eliquis, and has not been on anticoagulation.  Physical examination today revealed that he was tachycardic, but it did sound like sinus tachycardia.  We are awaiting his EKG to determine the need  for anticoagulation.  He admitted to helplessness, hopelessness and worthlessness.  He was admitted to the hospital for evaluation and stabilization.  Continued Clinical Symptoms:  Alcohol Use Disorder Identification Test Final Score (AUDIT): 39 The "Alcohol Use Disorders Identification Test", Guidelines for Use in Primary Care, Second Edition.  World Science writerHealth Organization [facility](WHO). Score between 0-7:  no or low risk or alcohol related problems. Score between 8-15:  moderate risk of alcohol related problems. Score between 16-19:  high risk of alcohol related problems. Score 20 or above:  warrants further diagnostic evaluation for alcohol dependence and treatment.   CLINICAL FACTORS:   Depression:   Anhedonia Comorbid alcohol abuse/dependence Hopelessness Impulsivity Insomnia Alcohol/Substance Abuse/Dependencies Previous Psychiatric Diagnoses and Treatments   Musculoskeletal: Strength & Muscle Tone: within normal limits Gait & Station: normal Patient leans: N/A  Psychiatric Specialty Exam: Physical Exam  Nursing note and vitals reviewed. Constitutional: He is oriented to person, place, and time. He appears well-developed and well-nourished.  HENT:  Head: Normocephalic and atraumatic.  Respiratory: Effort normal.  Neurological: He is alert and oriented to person, place, and time.    ROS  Blood pressure (!) 160/140, pulse (!) 124, temperature 98.7 F (37.1 C), temperature source Oral, resp. rate 18, height 6\' 3"  (1.905 m), weight (!) 145.2 kg, SpO2 100 %.Body mass index is 40 kg/m.  General Appearance: Disheveled  Eye Contact:  Fair  Speech:  Normal Rate  Volume:  Decreased  Mood:  Depressed  Affect:  Congruent  Thought Process:  Coherent and Descriptions of Associations: Intact  Orientation:  Full (Time, Place, and Person)  Thought Content:  Logical  Suicidal Thoughts:  Yes.  without intent/plan  Homicidal Thoughts:  No  Memory:  Immediate;   Fair Recent;   Fair Remote;    Fair  Judgement:  Impaired  Insight:  Lacking  Psychomotor Activity:  Decreased  Concentration:  Concentration: Fair and Attention Span: Fair  Recall:  FiservFair  Fund of Knowledge:  Fair  Language:  Fair  Akathisia:  Negative  Handed:  Right  AIMS (if indicated):     Assets:  Desire for Improvement Resilience  ADL's:  Intact  Cognition:  WNL  Sleep:         COGNITIVE FEATURES THAT CONTRIBUTE TO RISK:  None    SUICIDE RISK:   Mild:  Suicidal ideation of limited frequency, intensity, duration, and specificity.  There are no identifiable plans, no associated intent, mild dysphoria and related symptoms, good self-control (both objective and subjective assessment), few other risk factors, and identifiable protective factors, including available and accessible social support.  PLAN OF CARE: Patient is seen and examined.  Patient is a 47 year old male with the above-stated past psychiatric history who was admitted with suicidal ideation and alcohol dependence.  He will be admitted to the unit.  He will be integrated into the milieu.  He will be encouraged to attend groups.  He will have Librium 25 mg p.o. 4 times daily as needed a CIWA greater than 10.  He does have a history of hypertension, and will be restarted on his lisinopril, and has previously been on metoprolol.  I am going to start him on 25 mg p.o. twice daily after review his EKG.  If he is in atrial fibrillation he may require some form of anticoagulation.  This to be determined after review of his EKG.  His liver function enzymes are elevated.  He has been drinking excessively, but he stated for 2 weeks after he [facility] he was [facility]  He did not relapse until he left there approximately a week ago.  Hopefully his risk for severe alcohol withdrawal symptoms will be decreased.  Given his history though again he has Librium 25 mg p.o. 4 times daily PRN CIWA greater than 10, he  will also receive folic acid and thiamine.  We will monitor his blood pressure which is elevated, and his liver function enzymes.  I certify that inpatient services furnished can reasonably be expected to improve the patient's condition.   Antonieta PertGreg Lawson Clary, MD 12/14/2018, 12:19 PM

## 2018-12-14 NOTE — Progress Notes (Signed)
Per Nicholas ConnJason Berry, NP pt is recommended for continued observation for safety and stabilization due to hx of SI and pt's recent d/c from another inpt facility 1 week ago. TTS attempted to notify [name], [name], DO of the disposition but no answer. Pt's nurse [name], [name], RN has been advised of disposition.   [name], MSW, LCSW Therapeutic Triage Specialist  [phone]
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
## 2018-12-14 NOTE — Tx Team (Signed)
Initial Treatment Plan 12/14/2018 12:20 PM Nicholas Price [id number]    PATIENT STRESSORS: Financial difficulties Health problems Marital or family conflict Medication change or noncompliance Occupational concerns Substance abuse   PATIENT STRENGTHS: Ability for insight Active sense of humor Average or above average intelligence Communication skills General fund of knowledge Motivation for treatment/growth Supportive family/friends   PATIENT IDENTIFIED PROBLEMS: "work on my medications for depression"  "get back in the right mindset to keep me sober"                   DISCHARGE CRITERIA:  Ability to meet basic life and health needs Adequate post-discharge living arrangements Improved stabilization in mood, thinking, and/or behavior  PRELIMINARY DISCHARGE PLAN: Attend 12-step recovery group Outpatient therapy Placement in alternative living arrangements  PATIENT/FAMILY INVOLVEMENT: This treatment plan has been presented to and reviewed with the patient, Nicholas Price.  The patient and family have been given the opportunity to ask questions and make suggestions.  Raylene MiyamotoMichael R Eufemia Prindle, RN 12/14/2018, 12:20 PM

## 2018-12-14 NOTE — Progress Notes (Signed)
Writer has observed patient up in the dayroom watching tv after attending AA meeting tonight. Writer spoke with him 1:1 and informed him of medications available if needed. Vitals were taken and bp was elevated. Patient received librium to help with his withdrawals. He requested vistaril and trazadone for later before bed. He reports having a supportive family and this not being his first time inpatient for his drinking. He reports that he knows that he has to decide when he's ready to stop. Support given and safety maintained on unit with 15 min checks.

## 2018-12-14 NOTE — ED Notes (Signed)
Ordered breakfast tray reg.-Nicholas Price  

## 2018-12-14 NOTE — ED Notes (Signed)
Patient calm, cooperative, vss, resp e/u, nad

## 2018-12-14 NOTE — Progress Notes (Signed)
TTS spoke with [name], [name], DO and advised of recommendation.
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
## 2018-12-14 NOTE — ED Notes (Signed)
Sitter at bedside.

## 2018-12-14 NOTE — ED Notes (Signed)
TTS at bedside. 
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
## 2018-12-14 NOTE — [facility] Group Notes (Signed)
[facility] Group Notes:  Nursing Psychoeducation  Date:  12/14/2018  Time:  1:30 PM  Type of Therapy:  Psychoeducational Skills   Group Topic: Life Skills Facilitated By: Patricia D. RN  Participation Level:  Active  Participation Quality:  Appropriate  Affect:  Appropriate  Cognitive:  Alert and Oriented  Insight:  Improving  Engagement in Group:  Developing/Improving  Modes of Intervention:  Discussion, Education and Socialization  Summary of Progress/Problems: Patient attended group and contributed to the discussion.  Nicholas Price 12/14/2018, 2:00 PM 

## 2018-12-14 NOTE — ED Notes (Addendum)
Pt's belongings placed in [facility] locker [facility]6. Pt's cell phone and wallet brought to security. Envelope # [phone]. Pt retains glasses.  Verified with Leeroy Bockhelsea, RN

## 2018-12-14 NOTE — [facility] Counselor (Signed)
Nicholas NiemannJaime, DNP recommends inpatient treatment. Pt accepted to [facility]. Pt accepted to 300-1. Accepting Nicholas NiemannJaime, DNP. Attending Dr. Jola Babinskilary. Pt can arrive at 11am.  [name] PhoenixBrandi Carime Dinkel, [facility] Triage Specialist

## 2018-12-14 NOTE — ED Provider Notes (Signed)
[facility]   CSN: [id number] Arrival date & time: 12/13/18  2358     History   Chief Complaint Chief Complaint  Patient presents with  . Suicidal  . Alcohol Problem    HPI Tona Sensingric Weisbecker is a 47 y.o. male.  47 yo M with a cc of SI.  Patient has had thought previously but not acted on them.  Now getting more frequent and urgent.  Separated from spouse, homeless, unemployed.  Alcohol use disorder.  Has mental health issues but not taking meds for past couple weeks.  Denies medical complaint.   The history is provided by the patient.  Illness  This is a new problem. The current episode started more than 1 week ago. The problem occurs constantly. The problem has been gradually worsening. Pertinent negatives include no chest pain, no abdominal pain, no headaches and no shortness of breath. Nothing aggravates the symptoms. Nothing relieves the symptoms. He has tried nothing for the symptoms. The treatment provided no relief.    Past Medical History:  Diagnosis Date  . A-fib (HCC)   . Alcohol abuse   . Depression   . Hypertension     Patient Active Problem List   Diagnosis Date Noted  . Alcohol abuse with alcohol-induced mood disorder (HCC) 12/11/2017  . Alcohol abuse 11/26/2017  . Alcohol abuse with intoxication (HCC) 11/26/2017  . Alcohol withdrawal (HCC) 11/26/2017  . GERD (gastroesophageal reflux disease) 11/26/2017  . Abnormal LFTs 11/26/2017  . Abdominal pain 11/26/2017  . Insomnia 07/07/2015  . Anxiety and depression   . Hypertension 07/10/2012    History reviewed. No pertinent surgical history.      Home Medications    Prior to Admission medications   Not on File    Family History Family History  Problem Relation Age of Onset  . Anxiety disorder Brother   . Cancer Neg Hx   . Diabetes Neg Hx   . Stroke Neg Hx   . Heart disease Neg Hx     Social History Social History   Tobacco Use  . Smoking  status: Former Smoker    Types: Cigarettes  . Smokeless tobacco: Never Used  . Tobacco comment: quit 2013  Substance Use Topics  . Alcohol use: Yes    Comment: Pt states he drinks about 1 beer to a 1/5th of liquor per day,  and his last drink  last nite at 2am  . Drug use: No     Allergies   Patient has no known allergies.   Review of Systems Review of Systems  Constitutional: Negative for chills and fever.  HENT: Negative for congestion and facial swelling.   Eyes: Negative for discharge and visual disturbance.  Respiratory: Negative for shortness of breath.   Cardiovascular: Negative for chest pain and palpitations.  Gastrointestinal: Negative for abdominal pain, diarrhea and vomiting.  Musculoskeletal: Negative for arthralgias and myalgias.  Skin: Negative for color change and rash.  Neurological: Negative for tremors, syncope and headaches.  Psychiatric/Behavioral: Positive for suicidal ideas. Negative for confusion and dysphoric mood.     Physical Exam Updated Vital Signs BP (!) 145/96   Pulse 93   Temp 97.9 F (36.6 C) (Oral)   Resp 18   SpO2 92%   Physical Exam Vitals signs and nursing note reviewed.  Constitutional:      Appearance: He is well-developed.  HENT:     Head: Normocephalic and atraumatic.  Eyes:     Pupils:  Pupils are equal, round, and reactive to light.  Neck:     Musculoskeletal: Normal range of motion and neck supple.     Vascular: No JVD.  Cardiovascular:     Rate and Rhythm: Normal rate and regular rhythm.     Heart sounds: No murmur. No friction rub. No gallop.   Pulmonary:     Effort: No respiratory distress.     Breath sounds: No wheezing.  Abdominal:     General: There is no distension.     Tenderness: There is no guarding or rebound.  Musculoskeletal: Normal range of motion.  Skin:    Coloration: Skin is not pale.     Findings: No rash.  Neurological:     Mental Status: He is alert and oriented to person, place, and time.    Psychiatric:        Mood and Affect: Mood is depressed. Affect is tearful.        Behavior: Behavior normal.        Thought Content: Thought content includes suicidal ideation. Thought content does not include homicidal ideation. Thought content includes suicidal plan. Thought content does not include homicidal plan.      ED Treatments / Results  Labs (all labs ordered are listed, but only abnormal results are displayed) Labs Reviewed  COMPREHENSIVE METABOLIC PANEL - Abnormal; Notable for the following components:      Result Value   Glucose, Bld 131 (*)    Calcium 8.8 (*)    Albumin 3.2 (*)    AST 91 (*)    ALT 105 (*)    All other components within normal limits  ETHANOL - Abnormal; Notable for the following components:   Alcohol, Ethyl (B) 288 (*)    All other components within normal limits  ACETAMINOPHEN LEVEL - Abnormal; Notable for the following components:   Acetaminophen (Tylenol), Serum <10 (*)    All other components within normal limits  CBC - Abnormal; Notable for the following components:   Hemoglobin 18.9 (*)    HCT 55.5 (*)    All other components within normal limits  SALICYLATE LEVEL  RAPID URINE DRUG SCREEN, HOSP PERFORMED    EKG None  Radiology No results found.  Procedures Procedures (including critical care time)  Medications Ordered in ED Medications  LORazepam (ATIVAN) injection 0-4 mg ( Intravenous See Alternative 12/14/18 0230)    Or  LORazepam (ATIVAN) tablet 0-4 mg (0 mg Oral Not Given 12/14/18 0230)  LORazepam (ATIVAN) injection 0-4 mg (has no administration in time range)    Or  LORazepam (ATIVAN) tablet 0-4 mg (has no administration in time range)  thiamine (VITAMIN B-1) tablet 100 mg (has no administration in time range)    Or  thiamine (B-1) injection 100 mg (has no administration in time range)  ondansetron (ZOFRAN) tablet 4 mg (has no administration in time range)  ibuprofen (ADVIL,MOTRIN) tablet 600 mg (has no administration  in time range)  chlordiazePOXIDE (LIBRIUM) capsule 100 mg (100 mg Oral Given 12/14/18 0241)     Initial Impression / Assessment and Plan / ED Course  I have reviewed the triage vital signs and the nursing notes.  Pertinent labs & imaging results that were available during my care of the patient were reviewed by me and considered in my medical decision making (see chart for details).     47 yo M with a cc of SI.  Separated from wife, unemployed and homeless.  Plan to slit wrists.  Hx of  alcoholism.  No medical complaints.  No noted anemia.  Tylenol and salicylate negative.  Started on CIWA.  I feel he is medically clear.  TTS eval.   TTS has evaluated the patient and recommends reassessment in the morning.  The patients results and plan were reviewed and discussed.   Any x-rays performed were independently reviewed by myself.   Differential diagnosis were considered with the presenting HPI.  Medications  LORazepam (ATIVAN) injection 0-4 mg ( Intravenous See Alternative 12/14/18 0230)    Or  LORazepam (ATIVAN) tablet 0-4 mg (0 mg Oral Not Given 12/14/18 0230)  LORazepam (ATIVAN) injection 0-4 mg (has no administration in time range)    Or  LORazepam (ATIVAN) tablet 0-4 mg (has no administration in time range)  thiamine (VITAMIN B-1) tablet 100 mg (has no administration in time range)    Or  thiamine (B-1) injection 100 mg (has no administration in time range)  ondansetron (ZOFRAN) tablet 4 mg (has no administration in time range)  ibuprofen (ADVIL,MOTRIN) tablet 600 mg (has no administration in time range)  chlordiazePOXIDE (LIBRIUM) capsule 100 mg (100 mg Oral Given 12/14/18 0241)    Vitals:   12/14/18 0022 12/14/18 0215  BP: (!) 159/100 (!) 145/96  Pulse: 90 93  Resp: 20 18  Temp: 97.9 F (36.6 C)   TempSrc: Oral   SpO2: 95% 92%    Final diagnoses:  Alcoholic intoxication without complication (HCC)  Suicidal ideation    Admission/ observation were discussed with  the admitting physician, patient and/or family and they are comfortable with the plan.    Final Clinical Impressions(s) / ED Diagnoses   Final diagnoses:  Alcoholic intoxication without complication [facility](HCC)  Suicidal ideation    ED Discharge Orders    None       Melene PlanFloyd, Ramirez Fullbright, DO 12/14/18 [phone]

## 2018-12-14 NOTE — Progress Notes (Signed)
Pt BP at 1700 167/117 and HR 98 ( after lisinopril and metoprolol dose) and Clinical research associatewriter notified Dr Jola Babinskilary and per his order, pt was given 1 mg po ativan. BP recheck after dinner 149/92 .

## 2018-12-15 MED ORDER — METOPROLOL TARTRATE 25 MG PO TABS
25.0000 mg | ORAL_TABLET | Freq: Two times a day (BID) | ORAL | Status: DC
  Administered 2018-12-15 – 2018-12-16 (×3): 25 mg via ORAL
  Filled 2018-12-15 (×5): qty 1

## 2018-12-15 NOTE — Tx Team (Signed)
Interdisciplinary Treatment and Diagnostic Plan Update  12/15/2018 Time of Session: [id number] Nicholas Price MRN: [id number]  Principal Diagnosis: Major depressive disorder, recurrent severe without psychotic features [facility](HCC)  Secondary Diagnoses: Principal Problem:   Major depressive disorder, recurrent severe without psychotic features (HCC)   Current Medications:  Current Facility-Administered Medications  Medication Dose Route Frequency Provider Last Rate Last Dose  . alum & mag hydroxide-simeth (MAALOX/MYLANTA) 200-200-20 MG/5ML suspension 30 mL  30 mL Oral Q4H PRN Charm RingsLord, Jamison Y, NP      . chlordiazePOXIDE (LIBRIUM) capsule 25 mg  25 mg Oral QID PRN Antonieta Pertlary, Greg Lawson, MD   25 mg at 12/14/18 2057  . hydrOXYzine (ATARAX/VISTARIL) tablet 25 mg  25 mg Oral TID PRN Antonieta Pertlary, Greg Lawson, MD   25 mg at 12/14/18 2142  . ibuprofen (ADVIL,MOTRIN) tablet 600 mg  600 mg Oral Q8H PRN Charm RingsLord, Jamison Y, NP      . lisinopril (PRINIVIL,ZESTRIL) tablet 20 mg  20 mg Oral Daily Antonieta Pertlary, Greg Lawson, MD   20 mg at 12/14/18 1254  . LORazepam (ATIVAN) tablet 1 mg  1 mg Oral Once Antonieta Pertlary, Greg Lawson, MD       Or  . LORazepam (ATIVAN) injection 1 mg  1 mg Intramuscular Once Antonieta Pertlary, Greg Lawson, MD      . magnesium hydroxide (MILK OF MAGNESIA) suspension 30 mL  30 mL Oral Daily PRN Charm RingsLord, Jamison Y, NP      . metoprolol tartrate (LOPRESSOR) tablet 25 mg  25 mg Oral BID Antonieta Pertlary, Greg Lawson, MD      . ondansetron [facility](ZOFRAN) tablet 4 mg  4 mg Oral Q8H PRN Charm RingsLord, Jamison Y, NP      . thiamine (VITAMIN B-1) tablet 100 mg  100 mg Oral Daily Charm RingsLord, Jamison Y, NP       Or  . thiamine (B-1) injection 100 mg  100 mg Intravenous Daily Nanine MeansLord, Jamison Y, NP      . traZODone (DESYREL) tablet 50 mg  50 mg Oral QHS PRN Antonieta Pertlary, Greg Lawson, MD   50 mg at 12/14/18 2142   PTA Medications: No medications prior to admission.    Patient Stressors: Financial difficulties Health problems Marital or family conflict Medication change or  noncompliance Occupational concerns Substance abuse  Patient Strengths: Ability for insight Active sense of humor Average or above average intelligence Communication skills General fund of knowledge Motivation for treatment/growth Supportive family/friends  Treatment Modalities: Medication Management, Group therapy, Case management,  1 to 1 session with clinician, Psychoeducation, Recreational therapy.   Physician Treatment Plan for Primary Diagnosis: Major depressive disorder, recurrent severe without psychotic features (HCC) Long Term Goal(s): Improvement in symptoms so as ready for discharge Improvement in symptoms so as ready for discharge   Short Term Goals: Ability to identify changes in lifestyle to reduce recurrence of condition will improve Ability to verbalize feelings will improve Ability to disclose and discuss suicidal ideas Ability to demonstrate self-control will improve Ability to identify and develop effective coping behaviors will improve Ability to maintain clinical measurements within normal limits will improve Ability to identify triggers associated with substance abuse/mental health issues will improve Ability to identify changes in lifestyle to reduce recurrence of condition will improve Ability to verbalize feelings will improve Ability to disclose and discuss suicidal ideas Ability to demonstrate self-control will improve Ability to identify and develop effective coping behaviors will improve Ability to maintain clinical measurements within normal limits will improve Ability to identify triggers associated with substance abuse/mental health issues  will improve  Medication Management: Evaluate patient's response, side effects, and tolerance of medication regimen.  Therapeutic Interventions: 1 to 1 sessions, Unit Group sessions and Medication administration.   Evaluation of Outcomes: Progressing  Physician Treatment Plan for Secondary Diagnosis:  Principal Problem:   Major depressive disorder, recurrent severe without psychotic features (HCC)  Long Term Goal(s): Improvement in symptoms so as ready for discharge Improvement in symptoms so as ready for discharge   Short Term Goals: Ability to identify changes in lifestyle to reduce recurrence of condition will improve Ability to verbalize feelings will improve Ability to disclose and discuss suicidal ideas Ability to demonstrate self-control will improve Ability to identify and develop effective coping behaviors will improve Ability to maintain clinical measurements within normal limits will improve Ability to identify triggers associated with substance abuse/mental health issues will improve Ability to identify changes in lifestyle to reduce recurrence of condition will improve Ability to verbalize feelings will improve Ability to disclose and discuss suicidal ideas Ability to demonstrate self-control will improve Ability to identify and develop effective coping behaviors will improve Ability to maintain clinical measurements within normal limits will improve Ability to identify triggers associated with substance abuse/mental health issues will improve     Medication Management: Evaluate patient's response, side effects, and tolerance of medication regimen.  Therapeutic Interventions: 1 to 1 sessions, Unit Group sessions and Medication administration.  Evaluation of Outcomes: Progressing   RN Treatment Plan for Primary Diagnosis: Major depressive disorder, recurrent severe without psychotic features (HCC) Long Term Goal(s): Knowledge of disease and therapeutic regimen to maintain health will improve  Short Term Goals: Ability to participate in decision making will improve, Ability to verbalize feelings will improve, Ability to disclose and discuss suicidal ideas and Ability to identify and develop effective coping behaviors will improve  Medication Management: RN will  administer medications as ordered by provider, will assess and evaluate patient's response and provide education to patient for prescribed medication. RN will report any adverse and/or side effects to prescribing provider.  Therapeutic Interventions: 1 on 1 counseling sessions, Psychoeducation, Medication administration, Evaluate responses to treatment, Monitor vital signs and CBGs as ordered, Perform/monitor CIWA, COWS, AIMS and Fall Risk screenings as ordered, Perform wound care treatments as ordered.  Evaluation of Outcomes: Progressing   LCSW Treatment Plan for Primary Diagnosis: Major depressive disorder, recurrent severe without psychotic features (HCC) Long Term Goal(s): Safe transition to appropriate next level of care at discharge, Engage patient in therapeutic group addressing interpersonal concerns.  Short Term Goals: Engage patient in aftercare planning with referrals and resources, Increase emotional regulation, Facilitate patient progression through stages of change regarding substance use diagnoses and concerns and Identify triggers associated with mental health/substance abuse issues  Therapeutic Interventions: Assess for all discharge needs, 1 to 1 time with Social worker, Explore available resources and support systems, Assess for adequacy in community support network, Educate family and significant other(s) on suicide prevention, Complete Psychosocial Assessment, Interpersonal group therapy.  Evaluation of Outcomes: Progressing   Progress in Treatment: Attending groups: Yes. Participating in groups: Yes. Taking medication as prescribed: Yes. Toleration medication: Yes. Family/Significant other contact made: No, will contact:  family member if pt consents to collateral contact.  Patient understands diagnosis: Yes. Discussing patient identified problems/goals with staff: Yes. Medical problems stabilized or resolved: Yes. Denies suicidal/homicidal ideation:  Yes. Issues/concerns per patient self-inventory: No. Other: n/a   New problem(s) identified: No, Describe:  n/a  New Short Term/Long Term Goal(s): detox, medication management for mood stabilization; elimination  of SI thoughts; development of comprehensive mental wellness/sobriety plan.   Patient Goals:  "to work on my medications for depression and get back on the right mindset to keep me sober."   Discharge Plan or Barriers: CSW assessing for appropriate referrals. Pt was recently kicked out of [facility] due to not having enough money. [facility] pamphlet, Mobile Crisis information, and AA/NA information provided to patient for additional community support and resources.   Reason for Continuation of Hospitalization: Anxiety Depression Medication stabilization Withdrawal symptoms  Estimated Length of Stay: Thursday, 12/18/18  Attendees: Patient: Nicholas Price 12/15/2018 7:59 AM  Physician: Dr. Jola Babinski MD 12/15/2018 7:59 AM  Nursing: Lanora Manis RN 12/15/2018 7:59 AM  RN Care Manager:x 12/15/2018 7:59 AM  Social Worker: Corrie Mckusick LCSW 12/15/2018 7:59 AM  Recreational Therapist: x 12/15/2018 7:59 AM  Other: Marciano Sequin NP 12/15/2018 7:59 AM  Other:  12/15/2018 7:59 AM  Other: 12/15/2018 7:59 AM    Scribe for Treatment Team: Rona Ravens, LCSW 12/15/2018 7:59 AM

## 2018-12-15 NOTE — Progress Notes (Addendum)
Recreation Therapy Notes  Date: 12.23.19 Time: 0930 Location: [address] Dayroom  Group Topic: Stress Management  Goal Area(s) Addresses:  Patient will verbalize importance of using healthy stress management.  Patient will identify positive emotions associated with healthy stress management.   Behavioral Response: Engaged  Intervention: Stress Management  Activity :  Meditation.  LRT introduced the stress management technique of meditation.  LRT played a meditation that allowed patients to focus on the possibilities the day holds.  Patients were to follow along as the meditation played to engage in the activity.  Education:  Stress Management, Discharge Planning.   Education Outcome: Acknowledges edcuation/In group clarification offered/Needs additional education  Clinical Observations/Feedback: Pt attended and was engaged in group.     Caroll RancherMarjette Terrianne Cavness, LRT/CTRS     Caroll RancherLindsay, Zailynn Brandel A 12/15/2018 10:46 AM

## 2018-12-15 NOTE — Progress Notes (Signed)
CSW met with pt individually to discuss aftercare plan. Pt interested in [facility] and [facility] Referrals. Referrals made 12/15/2018 .1:43 PM   Estela Vinal S. Ouida Sills, MSW, LCSW Clinical Social Worker 12/15/2018 1:43 PM

## 2018-12-15 NOTE — Plan of Care (Signed)
  Problem: Activity: Goal: Interest or engagement in activities will improve Outcome: Progressing   Problem: Safety: Goal: Periods of time without injury will increase Outcome: Progressing  DAR NOTE: Patient presents with anxious affect and depressed mood.  Denies suicidal thoughts, pain, auditory and visual hallucinations.  Rates depression at 5, hopelessness at 5, and anxiety at 7.  Maintained on routine safety checks.  Medications given as prescribed.  Support and encouragement offered as needed.  Attended group and participated.  States goal for today is "speak with social worker to discuss discharge option."  Patient observed socializing with peers in the dayroom.  Offered no complaint.
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
## 2018-12-15 NOTE — Progress Notes (Signed)
Pt reports he had a good day.  He says that he is still having mild to moderate withdrawal symptoms at this time, but the medication is helping.  He denies SI/HI/AVH.  He voiced no needs or concerns this evening.  Pt is pleasant and cooperative.  He is med compliant on the unit.  Pt makes his needs know to staff.  He wants to go for long term treatment after detox, maybe [facility] or [facility].  Support and encouragement offered.  Discharge plans are in process.  Safety maintained with q15 minute checks.

## 2018-12-15 NOTE — [facility] Group Notes (Signed)
LCSW Group Therapy Note   12/15/2018 1:15pm   Type of Therapy and Topic:  Group Therapy:  Overcoming Obstacles   Participation Level:  Active   Description of Group:    In this group patients will be encouraged to explore what they see as obstacles to their own wellness and recovery. They will be guided to discuss their thoughts, feelings, and behaviors related to these obstacles. The group will process together ways to cope with barriers, with attention given to specific choices patients can make. Each patient will be challenged to identify changes they are motivated to make in order to overcome their obstacles. This group will be process-oriented, with patients participating in exploration of their own experiences as well as giving and receiving support and challenge from other group members.   Therapeutic Goals: 1. Patient will identify personal and current obstacles as they relate to admission. 2. Patient will identify barriers that currently interfere with their wellness or overcoming obstacles.  3. Patient will identify feelings, thought process and behaviors related to these barriers. 4. Patient will identify two changes they are willing to make to overcome these obstacles:      Summary of Patient Progress   Pt was attentive and engaged. He shared that his biggest obstacle involves figuring out how to maintain his sobriety. Minerva Areolaric stated that he was hoping to get into a residential treatment facility like [facility] or [facility]. He identified his estranged wife and his mother as his primary social supports. Minerva Areolaric continues to show progress in the group setting with improving insight.    Therapeutic Modalities:   Cognitive Behavioral Therapy Solution Focused Therapy Motivational Interviewing Relapse Prevention Therapy  Rona RavensHeather S Deliana Avalos, LCSW 12/15/2018 12:10 PM

## 2018-12-16 DIAGNOSIS — F332 Major depressive disorder, recurrent severe without psychotic features: Principal | ICD-10-CM

## 2018-12-16 MED ORDER — METOPROLOL TARTRATE 50 MG PO TABS
50.0000 mg | ORAL_TABLET | Freq: Two times a day (BID) | ORAL | Status: DC
  Administered 2018-12-16: 50 mg via ORAL
  Filled 2018-12-16 (×4): qty 1

## 2018-12-16 MED ORDER — VORTIOXETINE HBR 10 MG PO TABS
10.0000 mg | ORAL_TABLET | Freq: Every day | ORAL | Status: DC
  Administered 2018-12-16 – 2018-12-19 (×4): 10 mg via ORAL
  Filled 2018-12-16 (×5): qty 1

## 2018-12-16 NOTE — Progress Notes (Signed)
Recreation Therapy Notes   Animal-Assisted Activity (AAA) Program Checklist/Progress Notes Patient Eligibility Criteria Checklist & Daily Group note for Rec Tx Intervention  Date: 12.24.19 Time: 1100 [facility]: [address] [facility]   AAA/T Program Assumption of Risk Form signed by Engineer, productionatient/ or Parent Legal Guardian  YES   Patient is free of allergies or sever asthma  YES   Patient reports no fear of animals  YES   Patient reports no history of cruelty to animals  YES   Patient understands his/her participation is voluntary  YES   Patient washes hands before animal contact  YES   Patient washes hands after animal contact  YES   Behavioral Response: Engaged  Education: Charity fundraiserHand Washing, Appropriate Animal Interaction   Education Outcome: Acknowledges understanding/In group clarification offered/Needs additional education.   Clinical Observations/Feedback: Pt attended and participated in group.    Caroll RancherMarjette Milady Fleener, LRT/CTRS    Caroll RancherLindsay, Fe Okubo A 12/16/2018 11:46 AM

## 2018-12-16 NOTE — Progress Notes (Signed)
[facility] Progress Note  12/16/2018 8:25 AM Nicholas Price  MRN:  [id number] Subjective:    Patient seen in his room he reports very mild tremors believes that the detox medications are successful thus far his depression is lifting somewhat he acknowledges that he needs to go directly from here to a rehab facility to prevent relapse and further difficulties.  He is alert and oriented to person place time situation denies cravings denies psychotic symptoms and no history of seizures. As far as previously expressed suicidal thinking he does not have that at the present time Principal Problem: Major depressive disorder, recurrent severe without psychotic features (HCC) Diagnosis: Principal Problem:   Major depressive disorder, recurrent severe without psychotic features (HCC)  Total Time spent with patient: 20 minutes  Past Psychiatric History: depression/alcoholism  Past Medical History:  Past Medical History:  Diagnosis Date  . A-fib (HCC)   . Alcohol abuse   . Anxiety   . Depression   . Hypertension    History reviewed. No pertinent surgical history. Family History:  Family History  Problem Relation Age of Onset  . Anxiety disorder Brother   . Cancer Neg Hx   . Diabetes Neg Hx   . Stroke Neg Hx   . Heart disease Neg Hx    Social History:  Social History   Substance and Sexual Activity  Alcohol Use Yes   Comment: Pt states he drinks about 1 beer to a 1/5th of liquor per day,  and his last drink  last nite at 2am     Social History   Substance and Sexual Activity  Drug Use No    Social History   Socioeconomic History  . Marital status: Married    Spouse name: Not on file  . Number of children: Not on file  . Years of education: Not on file  . Highest education level: Not on file  Occupational History  . Not on file  Social Needs  . Financial resource strain: Not on file  . Food insecurity:    Worry: Not on file    Inability: Not on file  . Transportation needs:     Medical: Not on file    Non-medical: Not on file  Tobacco Use  . Smoking status: Former Smoker    Types: Cigarettes  . Smokeless tobacco: Never Used  . Tobacco comment: quit 2013  Substance and Sexual Activity  . Alcohol use: Yes    Comment: Pt states he drinks about 1 beer to a 1/5th of liquor per day,  and his last drink  last nite at 2am  . Drug use: No  . Sexual activity: Not Currently  Lifestyle  . Physical activity:    Days per week: Not on file    Minutes per session: Not on file  . Stress: Not on file  Relationships  . Social connections:    Talks on phone: Not on file    Gets together: Not on file    Attends religious service: Not on file    Active member of club or organization: Not on file    Attends meetings of clubs or organizations: Not on file    Relationship status: Not on file  Other Topics Concern  . Not on file  Social History Narrative  . Not on file   Additional Social History:                         Sleep: Good  Appetite:  Good  Current Medications: Current Facility-Administered Medications  Medication Dose Route Frequency Provider Last Rate Last Dose  . alum & mag hydroxide-simeth (MAALOX/MYLANTA) 200-200-20 MG/5ML suspension 30 mL  30 mL Oral Q4H PRN Charm RingsLord, Jamison Y, NP      . chlordiazePOXIDE (LIBRIUM) capsule 25 mg  25 mg Oral QID PRN Antonieta Pertlary, Greg Lawson, MD   25 mg at 12/15/18 2137  . hydrOXYzine (ATARAX/VISTARIL) tablet 25 mg  25 mg Oral TID PRN Antonieta Pertlary, Greg Lawson, MD   25 mg at 12/14/18 2142  . ibuprofen (ADVIL,MOTRIN) tablet 600 mg  600 mg Oral Q8H PRN Charm RingsLord, Jamison Y, NP      . lisinopril (PRINIVIL,ZESTRIL) tablet 20 mg  20 mg Oral Daily Antonieta Pertlary, Greg Lawson, MD   20 mg at 12/16/18 0820  . LORazepam (ATIVAN) tablet 1 mg  1 mg Oral Once Antonieta Pertlary, Greg Lawson, MD       Or  . LORazepam (ATIVAN) injection 1 mg  1 mg Intramuscular Once Antonieta Pertlary, Greg Lawson, MD      . magnesium hydroxide (MILK OF MAGNESIA) suspension 30 mL  30 mL Oral  Daily PRN Charm RingsLord, Jamison Y, NP      . metoprolol tartrate (LOPRESSOR) tablet 25 mg  25 mg Oral BID Antonieta Pertlary, Greg Lawson, MD   25 mg at 12/16/18 16100821  . ondansetron (ZOFRAN) tablet 4 mg  4 mg Oral Q8H PRN Charm RingsLord, Jamison Y, NP      . thiamine (VITAMIN B-1) tablet 100 mg  100 mg Oral Daily Charm RingsLord, Jamison Y, NP   100 mg at 12/16/18 0820   Or  . thiamine (B-1) injection 100 mg  100 mg Intravenous Daily Charm RingsLord, Jamison Y, NP      . traZODone (DESYREL) tablet 50 mg  50 mg Oral QHS PRN Antonieta Pertlary, Greg Lawson, MD   50 mg at 12/15/18 2137  . vortioxetine HBr (TRINTELLIX) tablet 10 mg  10 mg Oral Daily Malvin JohnsFarah, Johnsie Moscoso, MD        Lab Results: No results found for this or any previous visit (from the past 48 hour(s)).  Blood Alcohol level:  Lab Results  Component Value Date   ETH 288 (H) 12/14/2018   ETH 151 (H) 12/01/2018    Metabolic Disorder Labs: Lab Results  Component Value Date   HGBA1C 5.6 07/11/2012   MPG 114 07/11/2012   No results found for: PROLACTIN Lab Results  Component Value Date   CHOL 184 07/11/2012   TRIG 93 07/11/2012   HDL 54 07/11/2012   CHOLHDL 3.4 07/11/2012   VLDL 19 07/11/2012   LDLCALC 111 (H) 07/11/2012    Physical Findings: AIMS: Facial and Oral Movements Muscles of Facial Expression: None, normal Lips and Perioral Area: None, normal Jaw: None, normal Tongue: None, normal,Extremity Movements Upper (arms, wrists, hands, fingers): None, normal Lower (legs, knees, ankles, toes): None, normal, Trunk Movements Neck, shoulders, hips: None, normal, Overall Severity Severity of abnormal movements (highest score from questions above): None, normal Incapacitation due to abnormal movements: None, normal Patient's awareness of abnormal movements (rate only patient's report): No Awareness, Dental Status Current problems with teeth and/or dentures?: No Does patient usually wear dentures?: No  CIWA:  CIWA-Ar Total: 5 COWS:     Musculoskeletal: Strength & Muscle Tone: within  normal limits Gait & Station: normal Patient leans: N/A  Psychiatric Specialty Exam: Physical Exam  ROS  Blood pressure (!) 147/102, pulse 68, temperature 98.4 F (36.9 C), temperature source Oral, resp. rate 16, height 6'  3" (1.905 m), weight (!) 145.2 kg, SpO2 100 %.Body mass index is 40 kg/m.  General Appearance: Casual  Eye Contact:  Good  Speech:  Clear and Coherent  Volume:  Normal  Mood:  Dysphoric  Affect:  Appropriate  Thought Process:  Coherent  Orientation:  NA  Thought Content:  Logical  Suicidal Thoughts:  No  Homicidal Thoughts:  No  Memory:  Immediate;   Good  Judgement:  Fair  Insight:  Good  Psychomotor Activity:  Normal  Concentration:  Concentration: Good  Recall:  Good  Fund of Knowledge:  Good  Language:  Good  Akathisia:  Negative  Handed:  Right  AIMS (if indicated):     Assets:  Financial Resources/Insurance  ADL's:  Intact  Cognition:  WNL  Sleep:  Number of Hours: 6.5     Treatment Plan Summary: Daily contact with patient to assess and evaluate symptoms and progress in treatment, Medication management and Plan For alcohol withdrawal continue PRN Librium for alcoholism and depression continue cognitive and rehab based therapies, for longer-term management continue to seek rehab options for blood pressure addressed with beta-blocker  Malvin Johns, MD 12/16/2018, 8:25 AM

## 2018-12-16 NOTE — Plan of Care (Signed)
  Problem: Education: Goal: Emotional status will improve Outcome: Progressing Goal: Mental status will improve Outcome: Progressing   Problem: Education: Goal: Mental status will improve Outcome: Progressing   Problem: Safety: Goal: Periods of time without injury will increase Outcome: Progressing  DAR NOTE: Patient presents with anxious affect and depressed mood.  Denies suicidal thoughts, pain, auditory and visual hallucinations.  Described energy level as normal and concentration as good.  Rates depression at 3, hopelessness at 4, and anxiety at 4.  Maintained on routine safety checks.  Medications given as prescribed.  Support and encouragement offered as needed.  Attended group and participated.  States goal for today is "to call all my family member and wish them a merry christmas."  Patient visible in milieu with minimal interactions.  Offered no complaint.

## 2018-12-16 NOTE — Progress Notes (Signed)
Pt attended NA group this evening.  

## 2018-12-16 NOTE — Progress Notes (Signed)
D: Pt denies SI/HI/AVH. Pt is pleasant and cooperative. Pt stated he was doing better, his withdrawal Sx better. Pt stated [facility]C A: Pt was offered support and encouragement. Pt was given scheduled medications. Pt was encourage to attend groups. Q 15 minute checks were done for safety.  R:Pt attends groups and interacts well with peers and staff. Pt is taking medication. Pt has no complaints.Pt receptive to treatment and safety maintained on unit.  Problem: Education: Goal: Emotional status will improve Outcome: Progressing   Problem: Education: Goal: Mental status will improve Outcome: Progressing

## 2018-12-16 NOTE — [facility] Suicide Risk Assessment (Signed)
[facility] INPATIENT:  Family/Significant Other Suicide Prevention Education  Suicide Prevention Education: completed by Nicholas HomansMichaela Stanfield, LCSW Education Completed; Nicholas Price, mother, [phone], has been identified by the patient as the family member/significant other with whom the patient will be residing, and identified as the person(s) who will aid the patient in the event of a mental health crisis (suicidal ideations/suicide attempt).  With written consent from the patient, the family member/significant other has been provided the following suicide prevention education, prior to the and/or following the discharge of the patient.  The suicide prevention education provided includes the following:  Suicide risk factors  Suicide prevention and interventions  National Suicide Hotline telephone number  [facility] assessment telephone number  [facility] Emergency Assistance 911  [facility] and/or Residential Mobile Crisis Unit telephone number  Request made of family/significant other to:  Remove weapons (e.g., guns, rifles, knives), all items previously/currently identified as safety concern.    Remove drugs/medications (over-the-counter, prescriptions, illicit drugs), all items previously/currently identified as a safety concern.  The family member/significant other verbalizes understanding of the suicide prevention education information provided.  The family member/significant other agrees to remove the items of safety concern listed above.  Mother reports she is an option for pt to stay with at discharge as a "last resort" temporarily, but not long term option.  Nicholas Price, Nicholas Rengel Jon, LCSW 12/16/2018, 11:44 AM

## 2018-12-17 MED ORDER — METOPROLOL TARTRATE 100 MG PO TABS
100.0000 mg | ORAL_TABLET | Freq: Two times a day (BID) | ORAL | Status: DC
  Administered 2018-12-17 – 2018-12-18 (×3): 100 mg via ORAL
  Filled 2018-12-17: qty 1
  Filled 2018-12-17: qty 4
  Filled 2018-12-17 (×4): qty 1

## 2018-12-17 NOTE — Plan of Care (Signed)
Nurse discussed anxiety, depression, coping skills with patient. 

## 2018-12-17 NOTE — Progress Notes (Signed)
[facility]  12/17/2018 6:16 AM Nicholas Price  MRN:  [id number] Subjective:    Patient continues to have hypertension but no acute withdrawal symptoms.  Does not feel safe upon release fears relapse is very high probability therefore he wants to stay another day, then stay with his mother he describes that is a safe place to stay prior [facility] Patient is alert fully oriented cooperative affect appropriate speech normal rate and tone no thoughts of harming self or there is no withdrawal no tremors no psychosis Mild cravings  Principal Problem: Major depressive disorder, recurrent severe without psychotic features (HCC) Diagnosis: Principal Problem:   Major depressive disorder, recurrent severe without psychotic features (HCC)  Total Time spent with patient: 20 minutes Past Medical History:  Past Medical History:  Diagnosis Date  . A-fib (HCC)   . Alcohol abuse   . Anxiety   . Depression   . Hypertension    History reviewed. No pertinent surgical history. Family History:  Family History  Problem Relation Age of Onset  . Anxiety disorder Brother   . Cancer Neg Hx   . Diabetes Neg Hx   . Stroke Neg Hx   . Heart disease Neg Hx     Social History:  Social History   Substance and Sexual Activity  Alcohol Use Yes   Comment: Pt states he drinks about 1 beer to a 1/5th of liquor per day,  and his last drink  last nite at 2am     Social History   Substance and Sexual Activity  Drug Use No    Social History   Socioeconomic History  . Marital status: Married    Spouse name: Not on file  . Number of children: Not on file  . Years of education: Not on file  . Highest education level: Not on file  Occupational History  . Not on file  Social Needs  . Financial resource strain: Not on file  . Food insecurity:    Worry: Not on file    Inability: Not on file  . Transportation needs:    Medical: Not on file     Non-medical: Not on file  Tobacco Use  . Smoking status: Former Smoker    Types: Cigarettes  . Smokeless tobacco: Never Used  . Tobacco comment: quit 2013  Substance and Sexual Activity  . Alcohol use: Yes    Comment: Pt states he drinks about 1 beer to a 1/5th of liquor per day,  and his last drink  last nite at 2am  . Drug use: No  . Sexual activity: Not Currently  Lifestyle  . Physical activity:    Days per week: Not on file    Minutes per session: Not on file  . Stress: Not on file  Relationships  . Social connections:    Talks on phone: Not on file    Gets together: Not on file    Attends religious service: Not on file    Active member of club or organization: Not on file    Attends meetings of clubs or organizations: Not on file    Relationship status: Not on file  Other Topics Concern  . Not on file  Social History Narrative  . Not on file   Additional Social History:                         Sleep: Fair  Appetite:  Fair  Current Medications: Current Facility-Administered Medications  Medication Dose Route Frequency Provider Last Rate Last Dose  . alum & mag hydroxide-simeth (MAALOX/MYLANTA) 200-200-20 MG/5ML suspension 30 mL  30 mL Oral Q4H PRN Charm RingsLord, Jamison Y, NP      . chlordiazePOXIDE (LIBRIUM) capsule 25 mg  25 mg Oral QID PRN Antonieta Pertlary, Greg Lawson, MD   25 mg at 12/16/18 2110  . hydrOXYzine (ATARAX/VISTARIL) tablet 25 mg  25 mg Oral TID PRN Antonieta Pertlary, Greg Lawson, MD   25 mg at 12/14/18 2142  . ibuprofen (ADVIL,MOTRIN) tablet 600 mg  600 mg Oral Q8H PRN Charm RingsLord, Jamison Y, NP      . lisinopril (PRINIVIL,ZESTRIL) tablet 20 mg  20 mg Oral Daily Antonieta Pertlary, Greg Lawson, MD   20 mg at 12/16/18 0820  . LORazepam (ATIVAN) tablet 1 mg  1 mg Oral Once Antonieta Pertlary, Greg Lawson, MD       Or  . LORazepam (ATIVAN) injection 1 mg  1 mg Intramuscular Once Antonieta Pertlary, Greg Lawson, MD      . magnesium hydroxide (MILK OF MAGNESIA) suspension 30 mL  30 mL Oral Daily PRN Charm RingsLord, Jamison Y, NP       . metoprolol tartrate (LOPRESSOR) tablet 100 mg  100 mg Oral BID Malvin JohnsFarah, Zakirah Weingart, MD      . ondansetron [facility](ZOFRAN) tablet 4 mg  4 mg Oral Q8H PRN Charm RingsLord, Jamison Y, NP      . thiamine (VITAMIN B-1) tablet 100 mg  100 mg Oral Daily Charm RingsLord, Jamison Y, NP   100 mg at 12/16/18 0820   Or  . thiamine (B-1) injection 100 mg  100 mg Intravenous Daily Charm RingsLord, Jamison Y, NP      . traZODone (DESYREL) tablet 50 mg  50 mg Oral QHS PRN Antonieta Pertlary, Greg Lawson, MD   50 mg at 12/16/18 2110  . vortioxetine HBr (TRINTELLIX) tablet 10 mg  10 mg Oral Daily Malvin JohnsFarah, Nazirah Tri, MD   10 mg at 12/16/18 1202    Lab Results: No results found for this or any previous visit (from the past 48 hour(s)).  Blood Alcohol level:  Lab Results  Component Value Date   ETH 288 (H) 12/14/2018   ETH 151 (H) 12/01/2018    Metabolic Disorder Labs: Lab Results  Component Value Date   HGBA1C 5.6 07/11/2012   MPG 114 07/11/2012   No results found for: PROLACTIN Lab Results  Component Value Date   CHOL 184 07/11/2012   TRIG 93 07/11/2012   HDL 54 07/11/2012   CHOLHDL 3.4 07/11/2012   VLDL 19 07/11/2012   LDLCALC 111 (H) 07/11/2012    Physical Findings: AIMS: Facial and Oral Movements Muscles of Facial Expression: None, normal Lips and Perioral Area: None, normal Jaw: None, normal Tongue: None, normal,Extremity Movements Upper (arms, wrists, hands, fingers): None, normal Lower (legs, knees, ankles, toes): None, normal, Trunk Movements Neck, shoulders, hips: None, normal, Overall Severity Severity of abnormal movements (highest score from questions above): None, normal Incapacitation due to abnormal movements: None, normal Patient's awareness of abnormal movements (rate only patient's report): No Awareness, Dental Status Current problems with teeth and/or dentures?: No Does patient usually wear dentures?: No  CIWA:  CIWA-Ar Total: 5 COWS:     Musculoskeletal: Strength & Muscle Tone: within normal limits Gait & Station:  normal Patient leans: N/A  Psychiatric Specialty Exam: Physical Exam HTN  ROS mild cravings  Blood pressure (!) 145/101, pulse 89, temperature 98.4 F (36.9 C), temperature source Oral, resp. rate 16,  height 6\' 3"  (1.905 m), weight (!) 145.2 kg, SpO2 100 %.Body mass index is 40 kg/m.  General Appearance: Casual  Eye Contact:  Good  Speech:  Clear and Coherent  Volume:  Normal  Mood:  Euthymic  Affect:  Appropriate  Thought Process:  Coherent  Orientation:  Full (Time, Place, and Person)  Thought Content:  Logical  Suicidal Thoughts:  no  Homicidal Thoughts:  No  Memory:  intact  Judgement:  Good  Insight:  Good  Psychomotor Activity:  Normal  Concentration:  Concentration: Good  Recall:  Good  Fund of Knowledge:  Good  Language:  Good  Akathisia:  Negative  Handed:  Right  AIMS (if indicated):     Assets:  Communication Skills Desire for Improvement  ADL's:  Intact  Cognition:  WNL  Sleep:  Number of Hours: 6.25   Escalate beta-blocker for hypertension continue rehab based therapy for cravings continue antidepressant for depressive disorder Continue to explore rehab options  Treatment Plan Summary: Daily contact with patient to assess and evaluate symptoms and progress in treatment and Medication management  Paton Crum, MD 12/17/2018, 6:16 AM

## 2018-12-17 NOTE — Progress Notes (Addendum)
D:   Patient's self inventory sheet, patient sleeps good, sleep medication helpful.  Good appetite, normal energy level, good concentration.  Rated depression and hopeless 3, anxiety 4.  Withdrawals, tremors.  Denied SI.  Denied physical problems.  Denied physial pain.  Goal is stay focused on priorities, call family to [facility].  Does have discharge plans. A:  Medications administered per MD orders.  Emotional support and encouragement given patient. R:  Patient denied SI and HI, contracts for safety.  Denied A/V hallucinations.  Safety maintained with 15 minute checks.

## 2018-12-17 NOTE — Progress Notes (Signed)
D: Pt denies SI/HI/AVH. Pt is pleasant and cooperative. Pt stated he was doing good this evening. Pt visible on the unit  A: Pt was offered support and encouragement. Pt was given scheduled medications. Pt was encourage to attend groups. Q 15 minute checks were done for safety.  R:Pt attends groups and interacts well with peers and staff. Pt is taking medication. Pt has no complaints.Pt receptive to treatment and safety maintained on unit.  Problem: Activity: Goal: Sleeping patterns will improve Outcome: Progressing   Problem: Coping: Goal: Ability to verbalize frustrations and anger appropriately will improve Outcome: Progressing   Problem: Coping: Goal: Ability to demonstrate self-control will improve Outcome: Progressing   Problem: Health Behavior/Discharge Planning: Goal: Identification of resources available to assist in meeting health care needs will improve Outcome: Progressing

## 2018-12-18 MED ORDER — METOPROLOL TARTRATE 50 MG PO TABS
150.0000 mg | ORAL_TABLET | Freq: Two times a day (BID) | ORAL | Status: DC
  Administered 2018-12-19: 150 mg via ORAL
  Filled 2018-12-18 (×3): qty 3
  Filled 2018-12-18 (×2): qty 42
  Filled 2018-12-18: qty 3
  Filled 2018-12-18: qty 6

## 2018-12-18 NOTE — Progress Notes (Signed)
D: Pt denies SI/HI/AVH Pt is pleasant and cooperative. Pt stated he was doing better , [facility]. Pt visible on unit this evening with peers.  A: Pt was offered support and encouragement. Pt was given scheduled medications. Pt was encourage to attend groups. Q 15 minute checks were done for safety.  R:Pt attends groups and interacts well with peers and staff. Pt is taking medication. Pt has no complaints.Pt receptive to treatment and safety maintained on unit.  Problem: Self-Concept: Goal: Will verbalize positive feelings about self Outcome: Progressing   Problem: Education: Goal: Ability to make informed decisions regarding treatment will improve Outcome: Progressing   Problem: Coping: Goal: Coping ability will improve Outcome: Progressing

## 2018-12-18 NOTE — Progress Notes (Signed)
[facility]  12/18/2018 8:56 AM Nicholas Price  MRN:  [id number] Subjective:    Patient continues to feel relapses hoping to go to rehab by the date of the 6 he will stay with his mother in the meantime.  He remains hypertensive despite the absence of withdrawal symptoms in the presence of a beta-blocker he does not have thoughts of harming self or others cravings are minimal but problematic.  Principal Problem: Major depressive disorder, recurrent severe without psychotic features (HCC) Diagnosis: Principal Problem:   Major depressive disorder, recurrent severe without psychotic features (HCC)  Total Time spent with patient: 20 minutes   Past Medical History:  Past Medical History:  Diagnosis Date  . A-fib (HCC)   . Alcohol abuse   . Anxiety   . Depression   . Hypertension    History reviewed. No pertinent surgical history. Family History:  Family History  Problem Relation Age of Onset  . Anxiety disorder Brother   . Cancer Neg Hx   . Diabetes Neg Hx   . Stroke Neg Hx   . Heart disease Neg Hx     Social History:  Social History   Substance and Sexual Activity  Alcohol Use Yes   Comment: Pt states he drinks about 1 beer to a 1/5th of liquor per day,  and his last drink  last nite at 2am     Social History   Substance and Sexual Activity  Drug Use No    Social History   Socioeconomic History  . Marital status: Married    Spouse name: Not on file  . Number of children: Not on file  . Years of education: Not on file  . Highest education level: Not on file  Occupational History  . Not on file  Social Needs  . Financial resource strain: Not on file  . Food insecurity:    Worry: Not on file    Inability: Not on file  . Transportation needs:    Medical: Not on file    Non-medical: Not on file  Tobacco Use  . Smoking status: Former Smoker    Types: Cigarettes  . Smokeless tobacco: Never Used  . Tobacco comment: quit 2013  Substance and Sexual  Activity  . Alcohol use: Yes    Comment: Pt states he drinks about 1 beer to a 1/5th of liquor per day,  and his last drink  last nite at 2am  . Drug use: No  . Sexual activity: Not Currently  Lifestyle  . Physical activity:    Days per week: Not on file    Minutes per session: Not on file  . Stress: Not on file  Relationships  . Social connections:    Talks on phone: Not on file    Gets together: Not on file    Attends religious service: Not on file    Active member of club or organization: Not on file    Attends meetings of clubs or organizations: Not on file    Relationship status: Not on file  Other Topics Concern  . Not on file  Social History Narrative  . Not on file   Additional Social History:                         Sleep: Good  Appetite:  Good  Current Medications: Current Facility-Administered Medications  Medication Dose Route Frequency Provider Last Rate Last Dose  . alum & mag hydroxide-simeth (MAALOX/MYLANTA)  200-200-20 MG/5ML suspension 30 mL  30 mL Oral Q4H PRN Charm Rings, NP      . chlordiazePOXIDE (LIBRIUM) capsule 25 mg  25 mg Oral QID PRN Antonieta Pert, MD   25 mg at 12/17/18 2129  . hydrOXYzine (ATARAX/VISTARIL) tablet 25 mg  25 mg Oral TID PRN Antonieta Pert, MD   25 mg at 12/14/18 2142  . ibuprofen (ADVIL,MOTRIN) tablet 600 mg  600 mg Oral Q8H PRN Charm Rings, NP      . lisinopril (PRINIVIL,ZESTRIL) tablet 20 mg  20 mg Oral Daily Antonieta Pert, MD   20 mg at 12/18/18 0737  . LORazepam (ATIVAN) tablet 1 mg  1 mg Oral Once Antonieta Pert, MD       Or  . LORazepam (ATIVAN) injection 1 mg  1 mg Intramuscular Once Antonieta Pert, MD      . magnesium hydroxide (MILK OF MAGNESIA) suspension 30 mL  30 mL Oral Daily PRN Charm Rings, NP      . metoprolol tartrate (LOPRESSOR) tablet 150 mg  150 mg Oral BID Malvin Johns, MD      . ondansetron [facility]) tablet 4 mg  4 mg Oral Q8H PRN Charm Rings, NP      . thiamine  (VITAMIN B-1) tablet 100 mg  100 mg Oral Daily Charm Rings, NP   100 mg at 12/18/18 1610   Or  . thiamine (B-1) injection 100 mg  100 mg Intravenous Daily Charm Rings, NP      . traZODone (DESYREL) tablet 50 mg  50 mg Oral QHS PRN Antonieta Pert, MD   50 mg at 12/17/18 2130  . vortioxetine HBr (TRINTELLIX) tablet 10 mg  10 mg Oral Daily Malvin Johns, MD   10 mg at 12/18/18 9604    Lab Results: No results found for this or any previous visit (from the past 48 hour(s)).  Blood Alcohol level:  Lab Results  Component Value Date   ETH 288 (H) 12/14/2018   ETH 151 (H) 12/01/2018    Metabolic Disorder Labs: Lab Results  Component Value Date   HGBA1C 5.6 07/11/2012   MPG 114 07/11/2012   No results found for: PROLACTIN Lab Results  Component Value Date   CHOL 184 07/11/2012   TRIG 93 07/11/2012   HDL 54 07/11/2012   CHOLHDL 3.4 07/11/2012   VLDL 19 07/11/2012   LDLCALC 111 (H) 07/11/2012    Physical Findings: AIMS: Facial and Oral Movements Muscles of Facial Expression: None, normal Lips and Perioral Area: None, normal Jaw: None, normal Tongue: None, normal,Extremity Movements Upper (arms, wrists, hands, fingers): None, normal Lower (legs, knees, ankles, toes): None, normal, Trunk Movements Neck, shoulders, hips: None, normal, Overall Severity Severity of abnormal movements (highest score from questions above): None, normal Incapacitation due to abnormal movements: None, normal Patient's awareness of abnormal movements (rate only patient's report): No Awareness, Dental Status Current problems with teeth and/or dentures?: No Does patient usually wear dentures?: No  CIWA:  CIWA-Ar Total: 3 COWS:  COWS Total Score: 3  Musculoskeletal: Strength & Muscle Tone: within normal limits Gait & Station: normal  Psychiatric Specialty Exam: Physical Exam  ROS  Blood pressure (!) 135/95, pulse 60, temperature 98.6 F (37 C), temperature source Oral, resp. rate 20,  height 6\' 3"  (1.905 m), weight (!) 145.2 kg, SpO2 100 %.Body mass index is 40 kg/m.  General Appearance: Casual  Eye Contact:  Good  Speech:  Clear  and Coherent  Volume:  Normal  Mood:  Euthymic  Affect:  Appropriate  Thought Process:  Goal Directed  Orientation:  Full (Time, Place, and Person)  Thought Content:  Logical  Suicidal Thoughts:  No  Homicidal Thoughts:  No  Memory:  Immediate;   Good  Judgement:  Good  Insight:  Good  Psychomotor Activity:  Normal  Concentration:  Concentration: Good  Recall:  Good  Fund of Knowledge:  Good  Language:  Good  Akathisia:  Negative  Handed:  Right  AIMS (if indicated):     Assets:  Physical Health  ADL's:  Intact  Cognition:  WNL  Sleep:  Number of Hours: 6.5   Escalate Toprol for hypertension continue current precautions and rehab groups continue cognitive therapy and antidepressant therapy for depression Treatment Plan Summary: Daily contact with patient to assess and evaluate symptoms and progress in treatment and Medication management  Yazlin Ekblad, MD 12/18/2018, 8:56 AM

## 2018-12-18 NOTE — Plan of Care (Addendum)
  Problem: Education: Goal: Mental status will improve Outcome: Progressing   Problem: Coping: Goal: Ability to verbalize frustrations and anger appropriately will improve Outcome: Progressing   D: Pt alert and oriented on the unit. Pt engaging with RN staff and other pts. Pt denies SI/HI, A/VH, and denies any pain. Pt participated during unit groups and activities. Pt's rated her depression and feelings of hopelessness both a 2, and her anxiety a 3, all on a scale of 0 to 10, with 10 being the worst. Pt's goal for today is "to attend all groups and AA/NA." Pt is pleasant and cooperative. A: Education, support and encouragement provided, q15 minute safety checks remain in effect. Medications administered per MD orders. R: No reactions/side effects to medicine noted. Pt denies any concerns at this time, and verbally contracts for safety. Pt ambulating on the unit with no issues. Pt remains safe on and off the unit.

## 2018-12-19 MED ORDER — METOPROLOL TARTRATE 75 MG PO TABS: 150.0000 mg | ORAL_TABLET | Freq: Two times a day (BID) | ORAL | 0 refills | Status: DC

## 2018-12-19 MED ORDER — TRAZODONE HCL 50 MG PO TABS: 50.0000 mg | ORAL_TABLET | Freq: Every evening | ORAL | 0 refills | Status: DC | PRN

## 2018-12-19 MED ORDER — LISINOPRIL 20 MG PO TABS: 20.0000 mg | ORAL_TABLET | Freq: Every day | ORAL | 0 refills | Status: DC

## 2018-12-19 MED ORDER — TRAZODONE HCL 50 MG PO TABS: 50.0000 mg | ORAL_TABLET | Freq: Every evening | ORAL | 0 refills | Status: AC | PRN

## 2018-12-19 MED ORDER — ESCITALOPRAM OXALATE 20 MG PO TABS
20.0000 mg | ORAL_TABLET | Freq: Every day | ORAL | Status: DC
  Filled 2018-12-19: qty 7
  Filled 2018-12-19: qty 1

## 2018-12-19 MED ORDER — THIAMINE HCL 100 MG PO TABS: 100.0000 mg | ORAL_TABLET | Freq: Every day | ORAL | 0 refills | Status: DC

## 2018-12-19 MED ORDER — ESCITALOPRAM OXALATE 20 MG PO TABS: 20.0000 mg | ORAL_TABLET | Freq: Every day | ORAL | 0 refills | Status: AC

## 2018-12-19 MED ORDER — HYDROXYZINE HCL 25 MG PO TABS: 25.0000 mg | ORAL_TABLET | Freq: Three times a day (TID) | ORAL | 0 refills | Status: AC | PRN

## 2018-12-19 NOTE — Progress Notes (Signed)
  [facility] Discharge Plan :  Will you be returning to the same living situation after discharge:  Yes,  pt plans to return home with his mother [facility]. At discharge, do you have transportation home?: Yes,  mother Do you have the ability to pay for your medications: Yes,  mental health  Release of information consent forms completed and submitted to medical records by CSW.  Patient to Follow up at: Follow-up Information    [facility] Follow [facility].   Specialty:  Professional Counselor Why:  Please walk in for re-assessment. Walk in hours: Monday-Friday 8:30AM-12:00PM. Thank you.  Contact information: [address] [place] [address] [phone]        [facility] Follow up on 12/29/2018.   Why:  Screening for possible admission on Monday, 12/29/17 at 7:45AM. Please bring: [place], 30 day supply of all prescribed medications, and clothing. Thank you.  Contact information: [place] [address] [place] [address] [phone]        [facility] Follow up.   Specialty:  Addiction Medicine Why:  Referral faxed: 12/15/18. Phone Interview on 12/26. Please call Shayla in admissions to check waitlist and status of referral if you are still interested in this option for residential treatment.  Contact information: [address] [place] [address] [phone]           Next level of care provider [facility] Link:no  Safety Planning and Suicide Prevention discussed: Yes,  SPE completed with pt and his mother. SPI pamphlet and mobile crisis information provided to pt.    Has patient been referred to the Quitline?: Patient refused referral  Patient has been referred for addiction treatment: Yes  Rona RavensHeather S Shanira Tine, LCSW 12/19/2018, 8:54 AM

## 2018-12-19 NOTE — Tx Team (Signed)
Interdisciplinary Treatment and Diagnostic Plan Update  12/19/2018 Time of Session: [id number] Nicholas Price MRN: [id number]  Principal Diagnosis: Major depressive disorder, recurrent severe without psychotic features [facility](HCC)  Secondary Diagnoses: Principal Problem:   Major depressive disorder, recurrent severe without psychotic features (HCC)   Current Medications:  Current Facility-Administered Medications  Medication Dose Route Frequency Provider Last Rate Last Dose  . alum & mag hydroxide-simeth (MAALOX/MYLANTA) 200-200-20 MG/5ML suspension 30 mL  30 mL Oral Q4H PRN Charm RingsLord, Jamison Y, NP      . chlordiazePOXIDE (LIBRIUM) capsule 25 mg  25 mg Oral QID PRN Antonieta Pertlary, Greg Lawson, MD   25 mg at 12/18/18 2121  . [START ON 12/20/2018] escitalopram (LEXAPRO) tablet 20 mg  20 mg Oral Daily Aldean BakerSykes, Janet E, NP      . hydrOXYzine (ATARAX/VISTARIL) tablet 25 mg  25 mg Oral TID PRN Antonieta Pertlary, Greg Lawson, MD   25 mg at 12/14/18 2142  . ibuprofen (ADVIL,MOTRIN) tablet 600 mg  600 mg Oral Q8H PRN Charm RingsLord, Jamison Y, NP      . lisinopril (PRINIVIL,ZESTRIL) tablet 20 mg  20 mg Oral Daily Antonieta Pertlary, Greg Lawson, MD   20 mg at 12/19/18 0754  . LORazepam (ATIVAN) tablet 1 mg  1 mg Oral Once Antonieta Pertlary, Greg Lawson, MD       Or  . LORazepam (ATIVAN) injection 1 mg  1 mg Intramuscular Once Antonieta Pertlary, Greg Lawson, MD      . magnesium hydroxide (MILK OF MAGNESIA) suspension 30 mL  30 mL Oral Daily PRN Charm RingsLord, Jamison Y, NP      . metoprolol tartrate (LOPRESSOR) tablet 150 mg  150 mg Oral BID Malvin JohnsFarah, Brian, MD   150 mg at 12/19/18 0753  . ondansetron (ZOFRAN) tablet 4 mg  4 mg Oral Q8H PRN Charm RingsLord, Jamison Y, NP      . thiamine (VITAMIN B-1) tablet 100 mg  100 mg Oral Daily Charm RingsLord, Jamison Y, NP   100 mg at 12/19/18 [id number]   Or  . thiamine (B-1) injection 100 mg  100 mg Intravenous Daily Charm RingsLord, Jamison Y, NP      . traZODone (DESYREL) tablet 50 mg  50 mg Oral QHS PRN Antonieta Pertlary, Greg Lawson, MD   50 mg at 12/18/18 2121   PTA Medications: No  medications prior to admission.    Patient Stressors: Financial difficulties Health problems Marital or family conflict Medication change or noncompliance Occupational concerns Substance abuse  Patient Strengths: Ability for insight Active sense of humor Average or above average intelligence Communication skills General fund of knowledge Motivation for treatment/growth Supportive family/friends  Treatment Modalities: Medication Management, Group therapy, Case management,  1 to 1 session with clinician, Psychoeducation, Recreational therapy.   Physician Treatment Plan for Primary Diagnosis: Major depressive disorder, recurrent severe without psychotic features (HCC) Long Term Goal(s): Improvement in symptoms so as ready for discharge Improvement in symptoms so as ready for discharge   Short Term Goals: Ability to identify changes in lifestyle to reduce recurrence of condition will improve Ability to verbalize feelings will improve Ability to disclose and discuss suicidal ideas Ability to demonstrate self-control will improve Ability to identify and develop effective coping behaviors will improve Ability to maintain clinical measurements within normal limits will improve Ability to identify triggers associated with substance abuse/mental health issues will improve Ability to identify changes in lifestyle to reduce recurrence of condition will improve Ability to verbalize feelings will improve Ability to disclose and discuss suicidal ideas Ability to demonstrate self-control will improve Ability to  identify and develop effective coping behaviors will improve Ability to maintain clinical measurements within normal limits will improve Ability to identify triggers associated with substance abuse/mental health issues will improve  Medication Management: Evaluate patient's response, side effects, and tolerance of medication regimen.  Therapeutic Interventions: 1 to 1 sessions, Unit  Group sessions and Medication administration.   Evaluation of Outcomes:Adequate for discharge  Physician Treatment Plan for Secondary Diagnosis: Principal Problem:   Major depressive disorder, recurrent severe without psychotic features (HCC)  Long Term Goal(s): Improvement in symptoms so as ready for discharge Improvement in symptoms so as ready for discharge   Short Term Goals: Ability to identify changes in lifestyle to reduce recurrence of condition will improve Ability to verbalize feelings will improve Ability to disclose and discuss suicidal ideas Ability to demonstrate self-control will improve Ability to identify and develop effective coping behaviors will improve Ability to maintain clinical measurements within normal limits will improve Ability to identify triggers associated with substance abuse/mental health issues will improve Ability to identify changes in lifestyle to reduce recurrence of condition will improve Ability to verbalize feelings will improve Ability to disclose and discuss suicidal ideas Ability to demonstrate self-control will improve Ability to identify and develop effective coping behaviors will improve Ability to maintain clinical measurements within normal limits will improve Ability to identify triggers associated with substance abuse/mental health issues will improve     Medication Management: Evaluate patient's response, side effects, and tolerance of medication regimen.  Therapeutic Interventions: 1 to 1 sessions, Unit Group sessions and Medication administration.  Evaluation of Outcomes: Adequate for discharge  RN Treatment Plan for Primary Diagnosis: Major depressive disorder, recurrent severe without psychotic features (HCC) Long Term Goal(s): Knowledge of disease and therapeutic regimen to maintain health will improve  Short Term Goals: Ability to participate in decision making will improve, Ability to verbalize feelings will improve, Ability  to disclose and discuss suicidal ideas and Ability to identify and develop effective coping behaviors will improve  Medication Management: RN will administer medications as ordered by provider, will assess and evaluate patient's response and provide education to patient for prescribed medication. RN will report any adverse and/or side effects to prescribing provider.  Therapeutic Interventions: 1 on 1 counseling sessions, Psychoeducation, Medication administration, Evaluate responses to treatment, Monitor vital signs and CBGs as ordered, Perform/monitor CIWA, COWS, AIMS and Fall Risk screenings as ordered, Perform wound care treatments as ordered.  Evaluation of Outcomes: Adequate for discharge  LCSW Treatment Plan for Primary Diagnosis: Major depressive disorder, recurrent severe without psychotic features (HCC) Long Term Goal(s): Safe transition to appropriate next level of care at discharge, Engage patient in therapeutic group addressing interpersonal concerns.  Short Term Goals: Engage patient in aftercare planning with referrals and resources, Increase emotional regulation, Facilitate patient progression through stages of change regarding substance use diagnoses and concerns and Identify triggers associated with mental health/substance abuse issues  Therapeutic Interventions: Assess for all discharge needs, 1 to 1 time with Social worker, Explore available resources and support systems, Assess for adequacy in community support network, Educate family and significant other(s) on suicide prevention, Complete Psychosocial Assessment, Interpersonal group therapy.  Evaluation of Outcomes: Adequate   Progress in Treatment: Attending groups: Yes. Participating in groups: Yes. Taking medication as prescribed: Yes. Toleration medication: Yes. Family/Significant other contact made: SPE completed with pt's mother.  Patient understands diagnosis: Yes. Discussing patient identified problems/goals  with staff: Yes. Medical problems stabilized or resolved: Yes. Denies suicidal/homicidal ideation: Yes. Issues/concerns per patient self-inventory: No.  Other: n/a   New problem(s) identified: No, Describe:  n/a  New Short Term/Long Term Goal(s): detox, medication management for mood stabilization; elimination of SI thoughts; development of comprehensive mental wellness/sobriety plan.   Patient Goals:  "to work on my medications for depression and get back on the right mindset to keep me sober."   Discharge Plan or Barriers: [facility], [facility], and [facility]. Pt [facility] of [facility] due to not having enough money. MHAG pamphlet, Mobile Crisis information, and AA/NA information provided to patient for additional community support and resources.   Reason for Continuation of Hospitalization: none  Estimated Length of Stay: Friday, 12/19/18  Attendees: Patient: 12/19/2018 8:51 AM  Physician: Dr. Jeannine KittenFarah MD 12/19/2018 8:51 AM  Nursing: Alexia FreestonePatty RN; Marchelle Folksmanda RN 12/19/2018 8:51 AM  RN Care Manager:x 12/19/2018 8:51 AM  Social Worker: Corrie MckusickHeather Kimani Bedoya LCSW 12/19/2018 8:51 AM  Recreational Therapist: x 12/19/2018 8:51 AM  Other: Marciano SequinJanet Sykes NP; Armandina Stammergnes Nwoko NP 12/19/2018 8:51 AM  Other:  12/19/2018 8:51 AM  Other: 12/19/2018 8:51 AM    Scribe for Treatment Team: Rona RavensHeather S Toneshia Coello, LCSW 12/19/2018 8:51 AM

## 2018-12-19 NOTE — Progress Notes (Signed)
Recreation Therapy Notes  Date: 12.27.19 Time: 0930 Location: [address] Dayroom  Group Topic: Stress Management  Goal Area(s) Addresses:  Patient will engage in stress Regulatory affairs officermanagement practice. Patient will be able to identify stress management techniques.  Behavioral Response: Engaged  Intervention: Stress Management  Activity :  Mindful Meditation.  LRT introduced the stress management technique of meditation.  LRT played a meditation that allowed patients to observe the sounds around them, sensations they may have been feeling on their skin and how they were feeling in the moment.  Patients were to listen and follow along as meditation played to engage in the activity.    Education:  Stress Management, Discharge Planning.   Education Outcome: Acknowledges Education  Clinical Observations/Feedback:  Pt attended and participated in group.    Caroll RancherMarjette Calem Cocozza, LRT/CTRS     Caroll RancherLindsay, Erhardt Dada A 12/19/2018 11:31 AM

## 2018-12-19 NOTE — Progress Notes (Signed)
Pt prepared for dc as he completed his daily assessment and suicide safety plan. On the assessment, he wrote he denied having Suicidal ideation today and he rated his depression, hopelessness and anxiety " 7/2/3", respectively. His dc isntructions were reviewed with him by this Clinical research associatewriter and he was allowed to ask questions and he stated verbal understanding and willingness to comply.He was given cc of all dc instructions ( SRA, AVS, SSP and trnasition record), along with his belongings that were in his locker. HE was escorted to bldg entrance where he was dc'd ambulatory .

## 2018-12-19 NOTE — [facility] Suicide Risk Assessment (Signed)
[facility]   Principal Problem: Major depressive disorder, recurrent severe without psychotic features [facility](HCC) Discharge Diagnoses: Principal Problem:   Major depressive disorder, recurrent severe without psychotic features (HCC)   Total Time spent with patient: 30 minutes   Mental Status Per Nursing Assessment::   On Admission:  Suicidal ideation indicated by patient, Suicide plan, Self-harm thoughts, Intention to act on suicide plan, Belief that plan would result in death  Demographic Factors:  Male  Loss Factors: NA  Historical Factors: NA  Risk Reduction Factors:   Employed  Continued Clinical Symptoms:  Alcohol/Substance Abuse/Dependencies  Cognitive Features That Contribute To Risk:  None    Suicide Risk:  Minimal: No identifiable suicidal ideation.  Patients presenting with no risk factors but with morbid ruminations; may be classified as minimal risk based on the severity of the depressive symptoms  Follow-up Information    [facility] Follow [facility].   Specialty:  Professional Counselor Why:  Please walk in for re-assessment. Walk in hours: Monday-Friday 8:30AM-12:00PM. Thank you.  Contact information: [address] [place] [id number] [phone]        [facility] Follow up on 12/29/2018.   Why:  Screening for possible admission on Monday, 12/29/17 at 7:45AM. Please bring: [place], 30 day supply of all prescribed medications, and clothing. Thank you.  Contact information: [address] [place] [address] [phone]        [facility] Follow up.   Specialty:  Addiction Medicine Why:  Referral faxed: 12/15/18. Phone Interview on 12/26. Please call Shayla in admissions to check waitlist and status of referral if you are still interested in this option for residential treatment.  Contact information: [address]  [place] [address] [phone]           Plan Of Care/Follow-up recommendations:  Activity:  full  Shaymus Eveleth, MD 12/19/2018, 8:27 AM

## 2018-12-19 NOTE — Discharge Summary (Signed)
Physician Discharge Summary Note  Patient:  Nicholas Price is an 47 y.o., male MRN:  [id number] DOB:  [birth year] Patient phone:  [phone] (home)  Patient address:   [address] [place] [id number],  Total Time spent with patient: 15 minutes  Date of Admission:  12/14/2018 Date of Discharge: 12/19/2018  Reason for Admission:  Alcohol abuse, suicidal ideation  Principal Problem: Major depressive disorder, recurrent severe without psychotic features [facility](HCC) Discharge Diagnoses: Principal Problem:   Major depressive disorder, recurrent severe without psychotic features (HCC)   Past Psychiatric History: From admission H&P: Patient has had multiple psychiatric hospitalizations for alcohol-related issues.  He is been admitted to the psychiatric hospital here as well as other facilities.  He has been in substance rehabilitation programs and detox on multiple occasions as well.  He has been treated for depression with at least fluoxetine and citalopram.  Past Medical History:  Past Medical History:  Diagnosis Date  . A-fib (HCC)   . Alcohol abuse   . Anxiety   . Depression   . Hypertension    History reviewed. No pertinent surgical history. Family History:  Family History  Problem Relation Age of Onset  . Anxiety disorder Brother   . Cancer Neg Hx   . Diabetes Neg Hx   . Stroke Neg Hx   . Heart disease Neg Hx    Family Psychiatric  History: None Social History:  Social History   Substance and Sexual Activity  Alcohol Use Yes   Comment: Pt states he drinks about 1 beer to a 1/5th of liquor per day,  and his last drink  last nite at 2am     Social History   Substance and Sexual Activity  Drug Use No    Social History   Socioeconomic History  . Marital status: Married    Spouse name: Not on file  . Number of children: Not on file  . Years of education: Not on file  . Highest education level: Not on file  Occupational History  . Not on file  Social  Needs  . Financial resource strain: Not on file  . Food insecurity:    Worry: Not on file    Inability: Not on file  . Transportation needs:    Medical: Not on file    Non-medical: Not on file  Tobacco Use  . Smoking status: Former Smoker    Types: Cigarettes  . Smokeless tobacco: Never Used  . Tobacco comment: quit 2013  Substance and Sexual Activity  . Alcohol use: Yes    Comment: Pt states he drinks about 1 beer to a 1/5th of liquor per day,  and his last drink  last nite at 2am  . Drug use: No  . Sexual activity: Not Currently  Lifestyle  . Physical activity:    Days per week: Not on file    Minutes per session: Not on file  . Stress: Not on file  Relationships  . Social connections:    Talks on phone: Not on file    Gets together: Not on file    Attends religious service: Not on file    Active member of club or organization: Not on file    Attends meetings of clubs or organizations: Not on file    Relationship status: Not on file  Other Topics Concern  . Not on file  Social History Narrative  . Not on file    Hospital Course:  From admission H&P: Patient  is seen and examined. Patient is a 47 year old male with a past psychiatric history significant for alcohol dependence, alcohol induced mood disorder, hypertension, history of atrial fibrillation with anticoagulation who presented to the [facility] emergency department on 12/22 with suicidal ideation. Patient stated he was going to cut his wrist with razors. He stated that he has multiple stressors including being separated from his wife for the last 2 years and homeless. The patient has had multiple hospitalizations for detox, and has been in alcohol habilitation facilities on multiple occasions. His last psychiatric hospitalization here was in December 2018. His most recent detox and psychiatric hospitalization was at [facility] in [place], [place]. He was admitted there approximately 4  weeks ago. He remained there for 2 weeks, and has been living at [facility] for the last 2 to 3 weeks. He had to leave their facility this week because he was unable to make payments for his rent. Review of his electronic medical record revealed that when he was at [facility] during the last year he was diagnosed with atrial fibrillation. He was placed on Eliquis at that time. After he was discharged he did not have enough resources to be able to pay for the Eliquis, and has not been on anticoagulation. Physical examination today revealed that he was tachycardic, but it did sound like sinus tachycardia. We are awaiting his EKG to determine the need for anticoagulation. He admitted to helplessness, hopelessness and worthlessness. He was admitted to the hospital for evaluation and stabilization.  Mr. Nicholas Price was admitted for suicidal ideation with alcohol abuse. He was detoxed from alcohol with Librium CIWA protocol. He was treated and discharged with the medications listed below under Medication List. He also participated in group therapy for mental health and substance use. EKG showed sinus tachycardia. He was started on medications for hypertension. He responded well to treatment with no adverse effects.   Improvement was monitored by observation and Mr. Nicholas Price 's daily report of symptom reduction.  Emotional and mental status was monitored by daily self-inventory reports completed by Mr. Nicholas Price and clinical staff.         Mr. Nicholas Price was evaluated by the treatment team for stability and plans for continued recovery upon discharge. He will follow up with the services as listed below under Follow Up Information.     Upon completion of this admission the patient was both mentally and medically stable for discharge denying suicidal/homicidal ideation, auditory/visual/tactile hallucinations, delusional thoughts and paranoia.    Physical Findings: AIMS: Facial and Oral Movements Muscles  of Facial Expression: None, normal Lips and Perioral Area: None, normal Jaw: None, normal Tongue: None, normal,Extremity Movements Upper (arms, wrists, hands, fingers): None, normal Lower (legs, knees, ankles, toes): None, normal, Trunk Movements Neck, shoulders, hips: None, normal, Overall Severity Severity of abnormal movements (highest score from questions above): None, normal Incapacitation due to abnormal movements: None, normal Patient's awareness of abnormal movements (rate only patient's report): No Awareness, Dental Status Current problems with teeth and/or dentures?: No Does patient usually wear dentures?: No  CIWA:  CIWA-Ar Total: 3 COWS:  COWS Total Score: 3  Musculoskeletal: Strength & Muscle Tone: within normal limits Gait & Station: normal Patient leans: N/A  Psychiatric Specialty Exam: Physical Exam  Nursing note and vitals reviewed. Constitutional: He is oriented to person, place, and time.  Respiratory: Effort normal.  Neurological: He is alert and oriented to person, place, and time.    Review of Systems  Psychiatric/Behavioral: Positive for depression (Stable on medication) and substance abuse (Hx ETOH). Negative for hallucinations, memory loss and suicidal ideas. The patient is not nervous/anxious and does not have insomnia.     Blood pressure (!) 129/101, pulse 77, temperature 97.7 F (36.5 C), resp. rate 20, height 6\' 3"  (1.905 m), weight (!) 145.2 kg, SpO2 100 %.Body mass index is 40 kg/m.  General Appearance: Casual  Eye Contact:  Good  Speech:  Clear and Coherent  Volume:  Normal  Mood:  Euthymic  Affect:  Appropriate  Thought Process:  Coherent  Orientation:  Full (Time, Place, and Person)  Thought Content:  WDL  Suicidal Thoughts:  No  Homicidal Thoughts:  No  Memory:  Immediate;   Good  Judgement:  Fair  Insight:  Fair  Psychomotor Activity:  Normal  Concentration:  Concentration: Good  Recall:  Good  Fund of Knowledge:  Good  Language:   Good  Akathisia:  No  Handed:  Right  AIMS (if indicated):     Assets:  Communication Skills Desire for Improvement  ADL's:  Intact  Cognition:  WNL  Sleep:  Number of Hours: 6.5        Has this patient used any form of tobacco in the last 30 days? (Cigarettes, Smokeless Tobacco, Cigars, and/or Pipes)  No  Blood Alcohol level:  Lab Results  Component Value Date   ETH 288 (H) 12/14/2018   ETH 151 (H) 12/01/2018    Metabolic Disorder Labs:  Lab Results  Component Value Date   HGBA1C 5.6 07/11/2012   MPG 114 07/11/2012   No results found for: PROLACTIN Lab Results  Component Value Date   CHOL 184 07/11/2012   TRIG 93 07/11/2012   HDL 54 07/11/2012   CHOLHDL 3.4 07/11/2012   VLDL 19 07/11/2012   LDLCALC 111 (H) 07/11/2012    See Psychiatric Specialty Exam and Suicide Risk Assessment completed by Attending Physician prior to discharge.  Discharge destination:  Home  Is patient on multiple antipsychotic therapies at discharge:  No   Has Patient had three or more failed trials of antipsychotic monotherapy by history:  No  Recommended Plan for Multiple Antipsychotic Therapies: NA  Discharge Instructions    Discharge instructions   Complete by:  As directed    Patient is instructed to take all prescribed medications as recommended. Report any side effects or adverse reactions to your outpatient psychiatrist. Patient is instructed to abstain from alcohol and illegal drugs while on prescription medications. In the event of worsening symptoms, patient is instructed to call the crisis hotline, 911, or go to the nearest emergency department for evaluation and treatment.     Allergies as of 12/19/2018   No Known Allergies     Medication List    TAKE these medications     Indication  escitalopram 20 MG tablet Commonly known as:  LEXAPRO Take 1 tablet (20 mg total) by mouth daily. For mood Start taking on:  December 20, 2018  Indication:  Mood   hydrOXYzine 25  MG tablet Commonly known as:  ATARAX/VISTARIL Take 1 tablet (25 mg total) by mouth 3 (three) times daily as needed for anxiety or nausea.  Indication:  Feeling Anxious   lisinopril 20 MG tablet Commonly known as:  PRINIVIL,ZESTRIL Take 1 tablet (20 mg total) by mouth daily. For high blood pressure Start taking on:  December 20, 2018  Indication:  High Blood Pressure Disorder   Metoprolol Tartrate 75 MG Tabs Take 150 mg by  mouth 2 (two) times daily. For high blood pressure  Indication:  High Blood Pressure Disorder   traZODone 50 MG tablet Commonly known as:  DESYREL Take 1 tablet (50 mg total) by mouth at bedtime as needed for sleep.  Indication:  Trouble Sleeping      Follow-up Information    [facility] Follow up.   Specialty:  Professional Counselor Why:  Please walk in for re-assessment. Walk in hours: Monday-Friday 8:30AM-12:00PM. Thank you.  Contact information: [address] [place] [address] [phone]        [facility] Follow up on 12/29/2018.   Why:  Screening for possible admission on Monday, 12/29/17 at 7:45AM. Please bring: Photo ID/Proof of [employer] residency, 30 day supply of all prescribed medications, and clothing. Thank you.  Contact information: [address] [place] [address] [phone]        [facility] Follow up.   Specialty:  Addiction Medicine Why:  Referral faxed: 12/15/18. Phone Interview on 12/26. Please call Shayla in admissions to check waitlist and status of referral if you are still interested in this option for residential treatment.  Contact information: [address] [place] [address] [phone]           Follow-up recommendations: Activity as tolerated. Diet as recommended by primary care physician. Keep all scheduled follow-up appointments as recommended.    Comments:   Patient is instructed to take all prescribed  medications as recommended. Report any side effects or adverse reactions to your outpatient psychiatrist. Patient is instructed to abstain from alcohol and illegal drugs while on prescription medications. In the event of worsening symptoms, patient is instructed to call the crisis hotline, 911, or go to the nearest emergency department for evaluation and treatment.  Signed: Aldean Baker, NP 12/19/2018, 8:54 AM

## 2019-03-07 ENCOUNTER — Emergency Department (HOSPITAL_COMMUNITY): Payer: Medicaid Other

## 2019-03-07 ENCOUNTER — Inpatient Hospital Stay (HOSPITAL_COMMUNITY): Payer: Medicaid Other

## 2019-03-07 ENCOUNTER — Other Ambulatory Visit: Payer: Self-pay

## 2019-03-07 ENCOUNTER — Encounter (HOSPITAL_COMMUNITY): Payer: Self-pay

## 2019-03-07 ENCOUNTER — Inpatient Hospital Stay (HOSPITAL_COMMUNITY)
Admission: EM | Admit: 2019-03-07 | Discharge: 2019-03-16 | DRG: 433 | Disposition: A | Discharge status: Hospice | Payer: Medicaid Other | Attending: Internal Medicine | Admitting: Internal Medicine

## 2019-03-07 DIAGNOSIS — I10 Essential (primary) hypertension: Secondary | ICD-10-CM | POA: Diagnosis present

## 2019-03-07 DIAGNOSIS — K921 Melena: Secondary | ICD-10-CM | POA: Diagnosis present

## 2019-03-07 DIAGNOSIS — I480 Paroxysmal atrial fibrillation: Secondary | ICD-10-CM | POA: Diagnosis present

## 2019-03-07 DIAGNOSIS — E871 Hypo-osmolality and hyponatremia: Secondary | ICD-10-CM | POA: Diagnosis present

## 2019-03-07 DIAGNOSIS — R7401 Elevation of levels of liver transaminase levels: Secondary | ICD-10-CM

## 2019-03-07 DIAGNOSIS — Z66 Do not resuscitate: Secondary | ICD-10-CM | POA: Diagnosis present

## 2019-03-07 DIAGNOSIS — K766 Portal hypertension: Secondary | ICD-10-CM | POA: Diagnosis present

## 2019-03-07 DIAGNOSIS — E8770 Fluid overload, unspecified: Secondary | ICD-10-CM | POA: Diagnosis present

## 2019-03-07 DIAGNOSIS — D696 Thrombocytopenia, unspecified: Secondary | ICD-10-CM

## 2019-03-07 DIAGNOSIS — D689 Coagulation defect, unspecified: Secondary | ICD-10-CM | POA: Diagnosis present

## 2019-03-07 DIAGNOSIS — Z6841 Body Mass Index (BMI) 40.0 and over, adult: Secondary | ICD-10-CM

## 2019-03-07 DIAGNOSIS — Z590 Homelessness: Secondary | ICD-10-CM

## 2019-03-07 DIAGNOSIS — F419 Anxiety disorder, unspecified: Secondary | ICD-10-CM | POA: Diagnosis present

## 2019-03-07 DIAGNOSIS — E876 Hypokalemia: Secondary | ICD-10-CM | POA: Diagnosis present

## 2019-03-07 DIAGNOSIS — K7031 Alcoholic cirrhosis of liver with ascites: Secondary | ICD-10-CM | POA: Diagnosis present

## 2019-03-07 DIAGNOSIS — F329 Major depressive disorder, single episode, unspecified: Secondary | ICD-10-CM | POA: Diagnosis present

## 2019-03-07 DIAGNOSIS — K7200 Acute and subacute hepatic failure without coma: Secondary | ICD-10-CM

## 2019-03-07 DIAGNOSIS — Z87891 Personal history of nicotine dependence: Secondary | ICD-10-CM

## 2019-03-07 DIAGNOSIS — K7040 Alcoholic hepatic failure without coma: Principal | ICD-10-CM | POA: Diagnosis present

## 2019-03-07 DIAGNOSIS — K7682 Hepatic encephalopathy: Secondary | ICD-10-CM

## 2019-03-07 DIAGNOSIS — E8809 Other disorders of plasma-protein metabolism, not elsewhere classified: Secondary | ICD-10-CM | POA: Diagnosis present

## 2019-03-07 DIAGNOSIS — F10239 Alcohol dependence with withdrawal, unspecified: Secondary | ICD-10-CM | POA: Diagnosis present

## 2019-03-07 DIAGNOSIS — Z79899 Other long term (current) drug therapy: Secondary | ICD-10-CM

## 2019-03-07 DIAGNOSIS — F1010 Alcohol abuse, uncomplicated: Secondary | ICD-10-CM

## 2019-03-07 DIAGNOSIS — K7011 Alcoholic hepatitis with ascites: Secondary | ICD-10-CM

## 2019-03-07 DIAGNOSIS — Z515 Encounter for palliative care: Secondary | ICD-10-CM

## 2019-03-07 DIAGNOSIS — K7290 Hepatic failure, unspecified without coma: Secondary | ICD-10-CM | POA: Diagnosis not present

## 2019-03-07 DIAGNOSIS — Z818 Family history of other mental and behavioral disorders: Secondary | ICD-10-CM

## 2019-03-07 DIAGNOSIS — R52 Pain, unspecified: Secondary | ICD-10-CM

## 2019-03-07 DIAGNOSIS — R748 Abnormal levels of other serum enzymes: Secondary | ICD-10-CM | POA: Diagnosis present

## 2019-03-07 DIAGNOSIS — R531 Weakness: Secondary | ICD-10-CM

## 2019-03-07 DIAGNOSIS — R188 Other ascites: Secondary | ICD-10-CM

## 2019-03-07 DIAGNOSIS — R601 Generalized edema: Secondary | ICD-10-CM

## 2019-03-07 DIAGNOSIS — R17 Unspecified jaundice: Secondary | ICD-10-CM

## 2019-03-07 DIAGNOSIS — F10939 Alcohol use, unspecified with withdrawal, unspecified: Secondary | ICD-10-CM | POA: Diagnosis present

## 2019-03-07 DIAGNOSIS — Z7189 Other specified counseling: Secondary | ICD-10-CM

## 2019-03-07 DIAGNOSIS — R791 Abnormal coagulation profile: Secondary | ICD-10-CM | POA: Diagnosis present

## 2019-03-07 DIAGNOSIS — K219 Gastro-esophageal reflux disease without esophagitis: Secondary | ICD-10-CM | POA: Diagnosis present

## 2019-03-07 DIAGNOSIS — R740 Nonspecific elevation of levels of transaminase and lactic acid dehydrogenase [LDH]: Secondary | ICD-10-CM

## 2019-03-07 LAB — INFLUENZA PANEL BY PCR (TYPE A & B)
Influenza A By PCR: NEGATIVE
Influenza B By PCR: NEGATIVE

## 2019-03-07 LAB — CBC WITH DIFFERENTIAL/PLATELET
Abs Immature Granulocytes: 0.13 10*3/uL — ABNORMAL HIGH (ref 0.00–0.07)
Basophils Absolute: 0 10*3/uL (ref 0.0–0.1)
Basophils Relative: 1 %
Eosinophils Absolute: 0.1 10*3/uL (ref 0.0–0.5)
Eosinophils Relative: 1 %
HCT: 48.8 % (ref 39.0–52.0)
Hemoglobin: 17.3 g/dL — ABNORMAL HIGH (ref 13.0–17.0)
Immature Granulocytes: 3 %
Lymphocytes Relative: 11 %
Lymphs Abs: 0.5 10*3/uL — ABNORMAL LOW (ref 0.7–4.0)
MCH: 35.1 pg — AB (ref 26.0–34.0)
MCHC: 35.5 g/dL (ref 30.0–36.0)
MCV: 99 fL (ref 80.0–100.0)
Monocytes Absolute: 0.7 10*3/uL (ref 0.1–1.0)
Monocytes Relative: 15 %
Neutro Abs: 3.5 10*3/uL (ref 1.7–7.7)
Neutrophils Relative %: 69 %
Platelets: 73 10*3/uL — ABNORMAL LOW (ref 150–400)
RBC: 4.93 MIL/uL (ref 4.22–5.81)
RDW: 19.5 % — ABNORMAL HIGH (ref 11.5–15.5)
WBC: 5 10*3/uL (ref 4.0–10.5)
nRBC: 1.2 % — ABNORMAL HIGH (ref 0.0–0.2)

## 2019-03-07 LAB — URINALYSIS, ROUTINE W REFLEX MICROSCOPIC

## 2019-03-07 LAB — COMPREHENSIVE METABOLIC PANEL
ALT: 112 U/L — ABNORMAL HIGH (ref 0–44)
AST: 328 U/L — ABNORMAL HIGH (ref 15–41)
Albumin: 2.5 g/dL — ABNORMAL LOW (ref 3.5–5.0)
Alkaline Phosphatase: 116 U/L (ref 38–126)
Anion gap: 13 (ref 5–15)
BUN: 13 mg/dL (ref 6–20)
CO2: 29 mmol/L (ref 22–32)
Calcium: 8.8 mg/dL — ABNORMAL LOW (ref 8.9–10.3)
Chloride: 89 mmol/L — ABNORMAL LOW (ref 98–111)
Creatinine, Ser: 0.49 mg/dL — ABNORMAL LOW (ref 0.61–1.24)
GFR calc Af Amer: >60 mL/min (ref >60)
GFR calc non Af Amer: >60 mL/min (ref >60)
Glucose, Bld: 103 mg/dL — ABNORMAL HIGH (ref 70–99)
Potassium: 3.4 mmol/L — ABNORMAL LOW (ref 3.5–5.1)
SODIUM: 131 mmol/L — AB (ref 135–145)
Total Bilirubin: 25.4 mg/dL (ref 0.3–1.2)
Total Protein: 6.7 g/dL (ref 6.5–8.1)

## 2019-03-07 LAB — URINALYSIS, MICROSCOPIC (REFLEX)
RBC / HPF: NONE SEEN RBC/hpf (ref 0–5)
Squamous Epithelial / HPF: NONE SEEN (ref 0–5)

## 2019-03-07 LAB — ACETAMINOPHEN LEVEL

## 2019-03-07 LAB — LIPASE, BLOOD: Lipase: 165 U/L — ABNORMAL HIGH (ref 11–51)

## 2019-03-07 LAB — I-STAT TROPONIN, ED: Troponin i, poc: 0 ng/mL (ref 0.00–0.08)

## 2019-03-07 LAB — MAGNESIUM: Magnesium: 2.1 mg/dL (ref 1.7–2.4)

## 2019-03-07 LAB — BRAIN NATRIURETIC PEPTIDE: B Natriuretic Peptide: 37.1 pg/mL (ref 0.0–100.0)

## 2019-03-07 LAB — PROTIME-INR
INR: 2.3 — ABNORMAL HIGH (ref 0.8–1.2)
Prothrombin Time: 24.9 seconds — ABNORMAL HIGH (ref 11.4–15.2)

## 2019-03-07 LAB — SODIUM, URINE, RANDOM: Sodium, Ur: 27 mmol/L

## 2019-03-07 LAB — CREATININE, URINE, RANDOM: Creatinine, Urine: 79.91 mg/dL

## 2019-03-07 LAB — PHOSPHORUS: PHOSPHORUS: 2.9 mg/dL (ref 2.5–4.6)

## 2019-03-07 LAB — POC OCCULT BLOOD, ED: Fecal Occult Bld: NEGATIVE

## 2019-03-07 LAB — LACTIC ACID, PLASMA: Lactic Acid, Venous: 2.2 mmol/L (ref 0.5–1.9)

## 2019-03-07 LAB — AMMONIA: AMMONIA: 90 umol/L — AB (ref 9–35)

## 2019-03-07 MED ORDER — TRAZODONE HCL 50 MG PO TABS: 50.0000 mg | ORAL_TABLET | Freq: Every evening | ORAL | Status: DC | PRN

## 2019-03-07 MED ORDER — SODIUM CHLORIDE 0.9 % IV SOLN: 250.0000 mL | INTRAVENOUS | Status: DC | PRN

## 2019-03-07 MED ORDER — LORAZEPAM 2 MG/ML IJ SOLN: 1.0000 mg | Freq: Four times a day (QID) | INTRAMUSCULAR | Status: AC | PRN

## 2019-03-07 MED ORDER — FUROSEMIDE 10 MG/ML IJ SOLN
20.0000 mg | Freq: Once | INTRAMUSCULAR | Status: AC
  Administered 2019-03-07: 20 mg via INTRAVENOUS
  Filled 2019-03-07: qty 4

## 2019-03-07 MED ORDER — SODIUM CHLORIDE 0.9% FLUSH
3.0000 mL | INTRAVENOUS | Status: DC | PRN
  Administered 2019-03-08: 3 mL via INTRAVENOUS
  Filled 2019-03-07: qty 3

## 2019-03-07 MED ORDER — FOLIC ACID 1 MG PO TABS
1.0000 mg | ORAL_TABLET | Freq: Every day | ORAL | Status: DC
  Administered 2019-03-08 – 2019-03-16 (×9): 1 mg via ORAL
  Filled 2019-03-07 (×9): qty 1

## 2019-03-07 MED ORDER — ONDANSETRON HCL 4 MG PO TABS
4.0000 mg | ORAL_TABLET | Freq: Four times a day (QID) | ORAL | Status: DC | PRN
  Filled 2019-03-07: qty 1

## 2019-03-07 MED ORDER — LORAZEPAM 1 MG PO TABS
2.0000 mg | ORAL_TABLET | Freq: Once | ORAL | Status: AC
  Administered 2019-03-07: 2 mg via ORAL
  Filled 2019-03-07: qty 2

## 2019-03-07 MED ORDER — SODIUM CHLORIDE 0.9% FLUSH
3.0000 mL | Freq: Two times a day (BID) | INTRAVENOUS | Status: DC
  Administered 2019-03-08 – 2019-03-15 (×10): 3 mL via INTRAVENOUS

## 2019-03-07 MED ORDER — SODIUM CHLORIDE (PF) 0.9 % IJ SOLN
INTRAMUSCULAR | Status: AC
  Filled 2019-03-07: qty 50

## 2019-03-07 MED ORDER — POTASSIUM CHLORIDE CRYS ER 20 MEQ PO TBCR
40.0000 meq | EXTENDED_RELEASE_TABLET | Freq: Once | ORAL | Status: AC
  Administered 2019-03-07: 40 meq via ORAL
  Filled 2019-03-07: qty 2

## 2019-03-07 MED ORDER — FUROSEMIDE 20 MG PO TABS
20.0000 mg | ORAL_TABLET | Freq: Two times a day (BID) | ORAL | Status: DC
  Administered 2019-03-08: 20 mg via ORAL
  Filled 2019-03-07: qty 1

## 2019-03-07 MED ORDER — HYDROXYZINE HCL 25 MG PO TABS
25.0000 mg | ORAL_TABLET | Freq: Three times a day (TID) | ORAL | Status: DC | PRN
  Administered 2019-03-08 – 2019-03-12 (×4): 25 mg via ORAL
  Filled 2019-03-07 (×4): qty 1

## 2019-03-07 MED ORDER — VITAMIN B-1 100 MG PO TABS
100.0000 mg | ORAL_TABLET | Freq: Every day | ORAL | Status: DC
  Administered 2019-03-08 – 2019-03-16 (×9): 100 mg via ORAL
  Filled 2019-03-07 (×9): qty 1

## 2019-03-07 MED ORDER — IOPAMIDOL (ISOVUE-300) INJECTION 61%
100.0000 mL | Freq: Once | INTRAVENOUS | Status: AC | PRN
  Administered 2019-03-07: 100 mL via INTRAVENOUS

## 2019-03-07 MED ORDER — ONDANSETRON HCL 4 MG/2ML IJ SOLN: 4.0000 mg | Freq: Four times a day (QID) | INTRAMUSCULAR | Status: DC | PRN

## 2019-03-07 MED ORDER — THIAMINE HCL 100 MG/ML IJ SOLN
100.0000 mg | Freq: Every day | INTRAMUSCULAR | Status: DC
  Filled 2019-03-07 (×3): qty 2

## 2019-03-07 MED ORDER — LORAZEPAM 1 MG PO TABS
1.0000 mg | ORAL_TABLET | Freq: Four times a day (QID) | ORAL | Status: DC | PRN
  Administered 2019-03-08 – 2019-03-10 (×4): 1 mg via ORAL
  Filled 2019-03-07 (×4): qty 1

## 2019-03-07 MED ORDER — METOPROLOL TARTRATE 50 MG PO TABS
100.0000 mg | ORAL_TABLET | Freq: Two times a day (BID) | ORAL | Status: DC
  Administered 2019-03-08 (×2): 100 mg via ORAL
  Filled 2019-03-07 (×2): qty 2

## 2019-03-07 MED ORDER — IOPAMIDOL (ISOVUE-300) INJECTION 61%
INTRAVENOUS | Status: AC
  Filled 2019-03-07: qty 100

## 2019-03-07 MED ORDER — LACTULOSE 10 GM/15ML PO SOLN
20.0000 g | Freq: Two times a day (BID) | ORAL | Status: DC
  Administered 2019-03-08 – 2019-03-12 (×10): 20 g via ORAL
  Filled 2019-03-07 (×11): qty 30

## 2019-03-07 MED ORDER — PIPERACILLIN-TAZOBACTAM 3.375 G IVPB 30 MIN
3.3750 g | Freq: Once | INTRAVENOUS | Status: AC
  Administered 2019-03-07: 3.375 g via INTRAVENOUS
  Filled 2019-03-07: qty 50

## 2019-03-07 MED ORDER — SODIUM CHLORIDE 0.9 % IV BOLUS
500.0000 mL | Freq: Once | INTRAVENOUS | Status: AC
  Administered 2019-03-07: 500 mL via INTRAVENOUS

## 2019-03-07 MED ORDER — ADULT MULTIVITAMIN W/MINERALS CH
1.0000 | ORAL_TABLET | Freq: Every day | ORAL | Status: DC
  Administered 2019-03-08 – 2019-03-16 (×9): 1 via ORAL
  Filled 2019-03-07 (×10): qty 1

## 2019-03-07 NOTE — ED Notes (Signed)
Date and time results received: 03/07/19 1748 (use smartphrase ".now" to insert current time)  Test: lactic acid Critical Value: 2.2  Name of Provider Notified: Dr. Lockie Mola  Orders Received? Or Actions Taken?: notified lactic acid 2.2 to Dr. Lockie Mola.

## 2019-03-07 NOTE — ED Notes (Signed)
Date and time results received: 03/07/19 1802 (use smartphrase ".now" to insert current time)  Test: total bil Critical Value: 25.4  Name of Provider Notified: Dr. Lockie Mola  Orders Received? Or Actions Taken?: notified Dr Lockie Mola of total billy 25.4.

## 2019-03-07 NOTE — ED Notes (Signed)
ED TO INPATIENT HANDOFF REPORT  ED Nurse Name and Phone #: Tereso Newcomer Name/Age/Gender Nicholas Price 48 y.o. male Room/Bed: WA21/WA21  Code Status   Code Status: Prior  Home/SNF/Other Given to floor Patient oriented to: self, place, time and situation Is this baseline? Yes   Triage Complete: Triage complete  Chief Complaint Weakness, Jaundice  Triage Note Per EMS, Pt coming from home, complaining of SOB, no appetite, and weakness. HTX of ETOH abuse, presenting with jaundice.    Allergies No Known Allergies  Level of Care/Admitting Diagnosis ED Disposition    ED Disposition Condition Comment   Admit  Hospital Area: [facility] [[id number]]  Level of Care: Telemetry [5]  Admit to tele based on following criteria: Other see comments  Comments: hypokalemia  Diagnosis: Hepatic failure [facility]) [[id number]]  Admitting Physician: Therisa Doyne [[id number]]  Attending Physician: Therisa Doyne [[id number]]  Estimated length of stay: 3 - 4 days  Certification:: I certify this patient will need inpatient services for at least 2 midnights  PT Class (Do Not Modify): Inpatient [101]  PT Acc Code (Do Not Modify): Private [1]       B Medical/Surgery History Past Medical History:  Diagnosis Date  . A-fib (HCC)   . Alcohol abuse   . Anxiety   . Depression   . Hypertension    No past surgical history on file.   A IV Location/Drains/Wounds Patient Lines/Drains/Airways Status   Active Line/Drains/Airways    Name:   Placement date:   Placement time:   Site:   Days:   Peripheral IV 03/07/19 Left Hand   03/07/19    1532    Hand   less than 1   Peripheral IV 03/07/19 Left Antecubital   03/07/19    1757    Antecubital   less than 1          Intake/Output Last 24 hours  Intake/Output Summary (Last 24 hours) at 03/07/2019 2230 Last data filed at 03/07/2019 1906 Gross per 24 hour  Intake 537.83 ml  Output -  Net 537.83 ml    Labs/Imaging Results for orders  placed or performed during the hospital encounter of 03/07/19 (from the past 48 hour(s))  Acetaminophen level     Status: Abnormal   Collection Time: 03/07/19  3:27 PM  Result Value Ref Range   Acetaminophen (Tylenol), Serum <10 (L) 10 - 30 ug/mL    Comment: (NOTE) Therapeutic concentrations vary significantly. A range of 10-30 ug/mL  may be an effective concentration for many patients. However, some  are best treated at concentrations outside of this range. Acetaminophen concentrations >150 ug/mL at 4 hours after ingestion  and >50 ug/mL at 12 hours after ingestion are often associated with  toxic reactions. Performed at [facility], [address] [place], [place] [address]   Protime-INR     Status: Abnormal   Collection Time: 03/07/19  3:37 PM  Result Value Ref Range   Prothrombin Time 24.9 (H) 11.4 - 15.2 seconds   INR 2.3 (H) 0.8 - 1.2    Comment: (NOTE) INR goal varies based on device and disease states. Performed at [facility], [address] [place], [place] [address]   CBC with Differential     Status: Abnormal   Collection Time: 03/07/19  3:43 PM  Result Value Ref Range   WBC 5.0 4.0 - 10.5 K/uL   RBC 4.93 4.22 - 5.81 MIL/uL   Hemoglobin 17.3 (H) 13.0 -  17.0 g/dL   HCT 20.2 54.2 - 70.6 %   MCV 99.0 80.0 - 100.0 fL   MCH 35.1 (H) 26.0 - 34.0 pg   MCHC 35.5 30.0 - 36.0 g/dL   RDW 23.7 (H) 62.8 - 31.5 %   Platelets 73 (L) 150 - 400 K/uL    Comment: PLATELET COUNT CONFIRMED BY SMEAR SPECIMEN CHECKED FOR CLOTS Immature Platelet Fraction may be clinically indicated, consider ordering this additional test [id number]    nRBC 1.2 (H) 0.0 - 0.2 %   Neutrophils Relative % 69 %   Neutro Abs 3.5 1.7 - 7.7 K/uL   Lymphocytes Relative 11 %   Lymphs Abs 0.5 (L) 0.7 - 4.0 K/uL   Monocytes Relative 15 %   Monocytes Absolute 0.7 0.1 - 1.0 K/uL   Eosinophils Relative 1 %   Eosinophils Absolute 0.1 0.0 - 0.5 K/uL   Basophils Relative  1 %   Basophils Absolute 0.0 0.0 - 0.1 K/uL   Immature Granulocytes 3 %   Abs Immature Granulocytes 0.13 (H) 0.00 - 0.07 K/uL    Comment: Performed at [facility], [address], [place], [place] [address]  Comprehensive metabolic panel     Status: Abnormal   Collection Time: 03/07/19  3:43 PM  Result Value Ref Range   Sodium 131 (L) 135 - 145 mmol/L   Potassium 3.4 (L) 3.5 - 5.1 mmol/L   Chloride 89 (L) 98 - 111 mmol/L   CO2 29 22 - 32 mmol/L   Glucose, Bld 103 (H) 70 - 99 mg/dL   BUN 13 6 - 20 mg/dL   Creatinine, Ser 6.94 (L) 0.61 - 1.24 mg/dL   Calcium 8.8 (L) 8.9 - 10.3 mg/dL   Total Protein 6.7 6.5 - 8.1 g/dL   Albumin 2.5 (L) 3.5 - 5.0 g/dL   AST 854 (H) 15 - 41 U/L   ALT 112 (H) 0 - 44 U/L   Alkaline Phosphatase 116 38 - 126 U/L   Total Bilirubin 25.4 (HH) 0.3 - 1.2 mg/dL    Comment: CRITICAL RESULT CALLED TO, READ BACK BY AND VERIFIED WITH: JESSEE, B RN @1758  ON 03/07/2019 JACKSON,K    GFR calc non Af Amer >60 >60 mL/min   GFR calc Af Amer >60 >60 mL/min   Anion gap 13 5 - 15    Comment: Performed at [facility], [address], [place], [place] [address]  Lipase, blood     Status: Abnormal   Collection Time: 03/07/19  3:43 PM  Result Value Ref Range   Lipase 165 (H) 11 - 51 U/L    Comment: Performed at [facility], [address], [place], [place] [address]  Ammonia     Status: Abnormal   Collection Time: 03/07/19  3:43 PM  Result Value Ref Range   Ammonia 90 (H) 9 - 35 umol/L    Comment: Performed at [facility], [address], [place], [place] [address]  Brain natriuretic peptide     Status: None   Collection Time: 03/07/19  3:43 PM  Result Value Ref Range   B Natriuretic Peptide 37.1 0.0 - 100.0 pg/mL    Comment: Performed at [facility], [address] [place], [place] [address]  Urinalysis, Routine w reflex microscopic     Status: Abnormal   Collection Time:  03/07/19  3:46 PM  Result Value Ref Range   Color, Urine BROWN (A) YELLOW    Comment: BIOCHEMICALS  MAY BE AFFECTED BY COLOR   APPearance CLOUDY (A) CLEAR   Specific Gravity, Urine  1.005 - 1.030    TEST NOT REPORTED DUE TO COLOR INTERFERENCE OF URINE PIGMENT   pH  5.0 - 8.0    TEST NOT REPORTED DUE TO COLOR INTERFERENCE OF URINE PIGMENT   Glucose, UA (A) NEGATIVE mg/dL    TEST NOT REPORTED DUE TO COLOR INTERFERENCE OF URINE PIGMENT   Hgb urine dipstick (A) NEGATIVE    TEST NOT REPORTED DUE TO COLOR INTERFERENCE OF URINE PIGMENT   Bilirubin Urine (A) NEGATIVE    TEST NOT REPORTED DUE TO COLOR INTERFERENCE OF URINE PIGMENT   Ketones, ur (A) NEGATIVE mg/dL    TEST NOT REPORTED DUE TO COLOR INTERFERENCE OF URINE PIGMENT   Protein, ur (A) NEGATIVE mg/dL    TEST NOT REPORTED DUE TO COLOR INTERFERENCE OF URINE PIGMENT   Nitrite (A) NEGATIVE    TEST NOT REPORTED DUE TO COLOR INTERFERENCE OF URINE PIGMENT   Leukocytes,Ua (A) NEGATIVE    TEST NOT REPORTED DUE TO COLOR INTERFERENCE OF URINE PIGMENT    Comment: Performed at [facility], [address], [place], [place] [address]  Urinalysis, Microscopic (reflex)     Status: Abnormal   Collection Time: 03/07/19  3:46 PM  Result Value Ref Range   RBC / HPF NONE SEEN 0 - 5 RBC/hpf   WBC, UA 11-20 0 - 5 WBC/hpf   Bacteria, UA RARE (A) NONE SEEN   Squamous Epithelial / LPF NONE SEEN 0 - 5   Mucus PRESENT    WBC Casts, UA PRESENT    Amorphous Crystal PRESENT     Comment: Performed at [facility], [address], [place], [place] [address]  Lactic acid, plasma     Status: Abnormal   Collection Time: 03/07/19  4:01 PM  Result Value Ref Range   Lactic Acid, Venous 2.2 (HH) 0.5 - 1.9 mmol/L    Comment: ICTERUS AT THIS LEVEL MAY AFFECT RESULT CRITICAL RESULT CALLED TO, READ BACK BY AND VERIFIED WITH: JESSEE,B RN  ON 03/07/2019 JACKSON,K Performed at [facility], [address],  [place], [place] [address]   Influenza panel by PCR (type A & B)     Status: None   Collection Time: 03/07/19  4:05 PM  Result Value Ref Range   Influenza A By PCR NEGATIVE NEGATIVE   Influenza B By PCR NEGATIVE NEGATIVE    Comment: (NOTE) The Xpert Xpress Flu assay is intended as an aid in the diagnosis of  influenza and should not be used as a sole basis for treatment.  This  assay is FDA approved for nasopharyngeal swab specimens only. Nasal  washings and aspirates are unacceptable for Xpert Xpress Flu testing. Performed at [facility], [address] [place], [place] [address]   I-Stat Troponin, ED (not at [facility])     Status: None   Collection Time: 03/07/19  5:22 PM  Result Value Ref Range   Troponin i, poc 0.00 0.00 - 0.08 ng/mL   Comment 3            Comment: Due to the release kinetics of cTnI, a negative result within the first hours of the onset of symptoms does not rule out myocardial infarction with certainty. If myocardial infarction is still suspected, repeat the test at appropriate intervals.   POC occult blood, ED     Status: None   Collection Time: 03/07/19  10:21 PM  Result Value Ref Range   Fecal Occult Bld NEGATIVE NEGATIVE   Dg Chest 2 View  Result Date: 03/07/2019 CLINICAL DATA:  Shortness of breath for 2 weeks. EXAM: CHEST - 2 VIEW COMPARISON:  Chest radiograph 04/28/2018 FINDINGS: Monitoring leads overlie the patient. Stable cardiomegaly. Low lung volumes. Heterogeneous opacities right lower lung. No pleural effusion or pneumothorax. Thoracic spine degenerative changes. IMPRESSION: Heterogeneous opacities right lower lung may represent atelectasis or infection. Electronically Signed   By: Annia Belt M.D.   On: 03/07/2019 17:23   Ct Abdomen Pelvis W Contrast  Result Date: 03/07/2019 CLINICAL DATA:  Abdominal distention and bilateral leg edema. Lethargy. Weakness. Alcohol abuse. Ex-smoker. Inability to establish patency of the portal vein at  ultrasound earlier today due to difficulty penetrating the liver. EXAM: CT ABDOMEN AND PELVIS WITH CONTRAST TECHNIQUE: Multidetector CT imaging of the abdomen and pelvis was performed using the standard protocol following bolus administration of intravenous contrast. CONTRAST:  ISOVUE-300 IOPAMIDOL (ISOVUE-300) INJECTION 61% COMPARISON:  Right upper quadrant abdomen ultrasound obtained earlier today. FINDINGS: Lower chest: Mild bibasilar atelectasis/scarring. Normal sized heart. Hepatobiliary: Marked diffuse low density of the liver relative to the spleen. The caudate lobe of the liver is significantly enlarged. Probable sludge in the gallbladder. The gallbladder is mildly dilated borderline wall thickening. No pericholecystic fluid or stranding. Pancreas: Unremarkable. No pancreatic ductal dilatation or surrounding inflammatory changes. Spleen: Mildly enlarged. Adrenals/Urinary Tract: Adrenal glands are unremarkable. Kidneys are normal, without renal calculi, focal lesion, or hydronephrosis. Bladder is unremarkable. Stomach/Bowel: Stomach is within normal limits. Appendix appears normal. Concentric and eccentric rectal wall thickening, measuring 11 mm in maximum thickness distally. Otherwise, unremarkable colon. Normal appearing small bowel. Vascular/Lymphatic: Upper abdominal varices. Patent portal vein. No vascular occlusions are seen. No significant arterial abnormalities. No enlarged abdominal or pelvic lymph nodes. Reproductive: Prostate is unremarkable. Other: Moderate amount of free peritoneal fluid. Small umbilical and paraumbilical hernias containing fat. Musculoskeletal: Mild lower thoracic spine degenerative changes. IMPRESSION: 1. Changes of cirrhosis of the liver with marked hepatic steatosis and evidence of portal venous hypertension with associated splenomegaly and upper abdominal varices. 2. Moderate amount of ascites. 3. Concentric and eccentric rectal wall thickening, measuring 11 mm in  maximum thickness distally. This could be due to proctitis. A neoplasm is less likely but not excluded. 4. Mildly dilated gallbladder with probable sludge and borderline diffuse wall thickening. The wall thickening may be due to hypoproteinemia. Electronically Signed   By: Beckie Salts M.D.   On: 03/07/2019 20:50   US Abdomen Limited Ruq  Result Date: 03/07/2019 CLINICAL DATA:  Right upper quadrant pain for 2 weeks. Elevated LFTs EXAM: ULTRASOUND ABDOMEN LIMITED RIGHT UPPER QUADRANT COMPARISON:  None. FINDINGS: Gallbladder: No visible stones. Gallbladder wall slightly thickened at 4 mm. Negative sonographic Murphy's. Common bile duct: Diameter: Normal caliber, 6 mm Liver: Diffusely increased echotexture compatible with fatty infiltration. No focal hepatic abnormality. Cannot visualized or confirm blood flow in the main portal vein. IMPRESSION: Severe diffuse fatty infiltration of the liver. Cannot visualized or confirm portal venous blood flow. Consider further evaluation with CT with IV contrast to exclude portal venous thrombosis. Slight gallbladder wall thickening without visible stones or sonographic Murphy sign. This likely is related to liver disease. Electronically Signed   By: Charlett Nose M.D.   On: 03/07/2019 19:11    Pending Labs Unresulted Labs (From admission, onward)    Start     Ordered   03/08/19 0500  Protime-INR  Tomorrow morning,  R     03/07/19 2117   03/08/19 0500  Ammonia  Tomorrow morning,   STAT     03/07/19 2154   03/07/19 2137  Sodium, urine, random  Once,   R     03/07/19 2136   03/07/19 2137  Osmolality, urine  Once,   R     03/07/19 2136   03/07/19 2136  Creatinine, urine, random  Once,   R     03/07/19 2136   03/07/19 2124  Magnesium  Add-on,   R     03/07/19 2123   03/07/19 2124  Phosphorus  Add-on,   R     03/07/19 2123   03/07/19 1924  Hepatitis panel, acute  ONCE - STAT,   STAT     03/07/19 1923   03/07/19 1601  Lactic acid, plasma  Now then every 2 hours,    STAT     03/07/19 1600   03/07/19 1601  Urine culture  ONCE - STAT,   STAT     03/07/19 1600   03/07/19 1601  Blood culture (routine x 2)  BLOOD CULTURE X 2,   STAT     03/07/19 1600   Unscheduled  Occult blood card to lab, stool RN will collect  As needed,   R    Question:  Specimen to be collected by?  Answer:  RN will collect   03/07/19 2155   Signed and Held  HIV antibody (Routine Testing)  Tomorrow morning,   R     Signed and Held   Signed and Held  Magnesium  Tomorrow morning,   R    Comments:  Call MD if <1.5    Signed and Held   Signed and Held  Phosphorus  Tomorrow morning,   R     Signed and Held   Signed and Held  TSH  Once,   R    Comments:  Cancel if already done within 1 month and notify MD    Signed and Held   Signed and Held  Comprehensive metabolic panel  Once,   R    Comments:  Cal MD for K<3.5 or >5.0    Signed and Held   Signed and Held  CBC  Once,   R    Comments:  Call for hg <8.0    Signed and Held   Signed and Held  Hemoglobin A1c  Tomorrow morning,   R    Comments:  Cancel if has been done within past month and notify MD    Signed and Held   Signed and Held  Ammonia  Tomorrow morning,   STAT     Signed and Held          Vitals/Pain Today's Vitals   03/07/19 1856 03/07/19 1900 03/07/19 2000 03/07/19 2152  BP: 136/77 127/76 (!) 130/96 129/90  Pulse: (!) 115 (!) 109 (!) 116 (!) 112  Resp: (!) 23 (!) 24 (!) 25 (!) 26  Temp:      TempSrc:      SpO2: 92% 92% 94% 95%  Weight:      Height:      PainSc:        Isolation Precautions Droplet precaution  Medications Medications  iopamidol (ISOVUE-300) 61 % injection (has no administration in time range)  sodium chloride (PF) 0.9 % injection (has no administration in time range)  furosemide (LASIX) tablet 20 mg (has no administration in time range)  LORazepam (ATIVAN) tablet 2 mg (2  mg Oral Given 03/07/19 1626)  sodium chloride 0.9 % bolus 500 mL (0 mLs Intravenous Stopped 03/07/19 1906)   piperacillin-tazobactam (ZOSYN) IVPB 3.375 g (0 g Intravenous Stopped 03/07/19 1906)  iopamidol (ISOVUE-300) 61 % injection 100 mL (100 mLs Intravenous Contrast Given 03/07/19 2023)  potassium chloride SA (K-DUR,KLOR-CON) CR tablet 40 mEq (40 mEq Oral Given 03/07/19 2138)  furosemide (LASIX) injection 20 mg (20 mg Intravenous Given 03/07/19 2207)    Mobility walks with person assist Low fall risk   Focused Assessments Cardiac Assessment Handoff:  Cardiac Rhythm: Normal sinus rhythm Lab Results  Component Value Date   TROPONINI <0.03 05/20/2015   Lab Results  Component Value Date   DDIMER 0.48 05/20/2015   Does the Patient currently have chest pain? No  , Neuro Assessment Handoff:  Swallow screen pass? Yes  Cardiac Rhythm: Normal sinus rhythm       Neuro Assessment:   Neuro Checks:      Last Documented NIHSS Modified Score:   Has TPA been given? No If patient is a Neuro Trauma and patient is going to OR before floor call report to 4N Charge nurse: [phone] or [phone]     R Recommendations: See Admitting Provider Note  Report given to:   Additional Notes:

## 2019-03-07 NOTE — H&P (Signed)
Nicholas Price EAV:[id number] DOB: [birth year] DOA: 03/07/2019     PCP: Nicholas Munroe, NP   Outpatient Specialists:   NONE   Patient arrived to ER on 03/07/19 at 1527  Patient coming from: home Lives   With family    Chief Complaint:  Chief Complaint  Patient presents with   Weakness   Jaundice   Alcohol Problem    HPI: Nicholas Price is a 48 y.o. male with medical history significant of alcohol abuse depression, anxiety, atrial fibrillation, hypertension    Presented with   generalized fatigue and jaundice.  Came from home complaining shortness of breath no appetite and generalized weakness noticed to have some jaundice. For the past few days had forgetfulness He has had up to 8 BM's a day stools have been black  He drinks 3 bottles a day. Has been more short of breath He has been drinking for 27 years Last ETOH was Thurday night He has been sober up to 5 years   Regarding pertinent Chronic problems:  EtOH abuse - reports no hx of severe ETOH withdrawal History of hypertension for which she takes lisinopril metoprolol   While in ER:  The following Work up has been ordered so far:  Orders Placed This Encounter  Procedures   Critical Care   Urine culture   Blood culture (routine x 2)   DG Chest 2 View   US Abdomen Limited RUQ   CBC with Differential   Comprehensive metabolic panel   Lipase, blood   Ammonia   Brain natriuretic peptide   Urinalysis, Routine w reflex microscopic   Lactic acid, plasma   Influenza panel by PCR (type A & B)   Protime-INR   Urinalysis, Microscopic (reflex)   Check Rectal Temperature   Consult to gastroenterology   Consult to hospitalist   Droplet precaution   I-Stat Troponin, ED (not at [facility])   ED EKG   EKG 12-Lead   Insert peripheral IV   Following Medications were ordered in ER: Medications  LORazepam (ATIVAN) tablet 2 mg (2 mg Oral Given 03/07/19 1626)  sodium chloride 0.9 % bolus 500 mL (0 mLs  Intravenous Stopped 03/07/19 1906)  piperacillin-tazobactam (ZOSYN) IVPB 3.375 g (0 g Intravenous Stopped 03/07/19 1906)    Significant initial  Findings: Abnormal Labs Reviewed  CBC WITH DIFFERENTIAL/PLATELET - Abnormal; Notable for the following components:      Result Value   Hemoglobin 17.3 (*)    MCH 35.1 (*)    RDW 19.5 (*)    Platelets 73 (*)    nRBC 1.2 (*)    Lymphs Abs 0.5 (*)    Abs Immature Granulocytes 0.13 (*)    All other components within normal limits  COMPREHENSIVE METABOLIC PANEL - Abnormal; Notable for the following components:   Sodium 131 (*)    Potassium 3.4 (*)    Chloride 89 (*)    Glucose, Bld 103 (*)    Creatinine, Ser 0.49 (*)    Calcium 8.8 (*)    Albumin 2.5 (*)    AST 328 (*)    ALT 112 (*)    Total Bilirubin 25.4 (*)    All other components within normal limits  LIPASE, BLOOD - Abnormal; Notable for the following components:   Lipase 165 (*)    All other components within normal limits  AMMONIA - Abnormal; Notable for the following components:   Ammonia 90 (*)    All other components within normal limits  URINALYSIS, ROUTINE  W REFLEX MICROSCOPIC - Abnormal; Notable for the following components:   Color, Urine BROWN (*)    APPearance CLOUDY (*)    Glucose, UA   (*)    Value: TEST NOT REPORTED DUE TO COLOR INTERFERENCE OF URINE PIGMENT   Hgb urine dipstick   (*)    Value: TEST NOT REPORTED DUE TO COLOR INTERFERENCE OF URINE PIGMENT   Bilirubin Urine   (*)    Value: TEST NOT REPORTED DUE TO COLOR INTERFERENCE OF URINE PIGMENT   Ketones, ur   (*)    Value: TEST NOT REPORTED DUE TO COLOR INTERFERENCE OF URINE PIGMENT   Protein, ur   (*)    Value: TEST NOT REPORTED DUE TO COLOR INTERFERENCE OF URINE PIGMENT   Nitrite   (*)    Value: TEST NOT REPORTED DUE TO COLOR INTERFERENCE OF URINE PIGMENT   Leukocytes,Ua   (*)    Value: TEST NOT REPORTED DUE TO COLOR INTERFERENCE OF URINE PIGMENT   All other components within normal limits  LACTIC ACID,  PLASMA - Abnormal; Notable for the following components:   Lactic Acid, Venous 2.2 (*)    All other components within normal limits  PROTIME-INR - Abnormal; Notable for the following components:   Prothrombin Time 24.9 (*)    INR 2.3 (*)    All other components within normal limits  URINALYSIS, MICROSCOPIC (REFLEX) - Abnormal; Notable for the following components:   Bacteria, UA RARE (*)    All other components within normal limits    Lactic Acid, Venous    Component Value Date/Time   LATICACIDVEN 2.2 (HH) 03/07/2019 1601    Cr stable,    Lab Results  Component Value Date   CREATININE 0.49 (L) 03/07/2019   CREATININE 0.88 12/14/2018   CREATININE 1.06 12/01/2018     WBC   5.0  HG/HCT  Up from baseline see below    Component Value Date/Time   HGB 17.3 (H) 03/07/2019 1543   HCT 48.8 03/07/2019 1543     Troponin (Point of Care Test) Recent Labs    03/07/19 1722  TROPIPOC 0.00      BNP (last 3 results) Recent Labs    03/07/19 1543  BNP 37.1    ProBNP (last 3 results) No results for input(s): PROBNP in the last 8760 hours.    UA  no evidence of UTI     CXR - RLL atelectasis or infection   CTabd/pelvis - Cirrhosis splenomegaly, Portal hypertension Moderate Ascites,   US hepatic steatosis  ECG:  Personally reviewed by me showing: HR : 113 Rhythm: Sinus tachycardia   no evidence of ischemic changes QTC 546    ED Triage Vitals  Enc Vitals Group     BP 03/07/19 1544 (!) 140/91     Pulse Rate 03/07/19 1544 (!) 113     Resp 03/07/19 1544 20     Temp 03/07/19 1544 99.4 F (37.4 C)     Temp Source 03/07/19 1623 Rectal     SpO2 03/07/19 1532 92 %     Weight 03/07/19 1544 (!) 325 lb (147.4 kg)     Height 03/07/19 1544  (1.88 m)     Head Circumference --      Peak Flow --      Pain Score 03/07/19 1544 0     Pain Loc --      Pain Edu? --      Excl. in GC? --   WUJW(11)@  Latest  Blood pressure 136/77, pulse (!) 115, temperature 98.5 F  (36.9 C), temperature source Rectal, resp. rate (!) 23, height 6\' 2"  (1.88 m), weight (!) 147.4 kg, SpO2 92 %.    ER Provider Called:  GI   Dr. Loreta Ave They Recommend admit to medicine  Will see in AM  Hospitalist was called for admission for Alcohol induce liver failure    Review of Systems:    Pertinent positives include: fatigue nausea , Bilateral lower extremity swelling  Anasarca, abdominal distention, confusion melena Constitutional:  No weight loss, night sweats, Fevers, chills, , weight loss  HEENT:  No headaches, Difficulty swallowing,Tooth/dental problems,Sore throat,  No sneezing, itching, ear ache, nasal congestion, post nasal drip,  Cardio-vascular:  No chest pain, Orthopnea, PND,  dizziness, palpitations.no  GI:  No heartburn, indigestion, abdominal pain, , vomiting, diarrhea, change in bowel habits, loss of appetite, , blood in stool, hematemesis Resp:  no shortness of breath at rest. No dyspnea on exertion, No excess mucus, no productive cough, No non-productive cough, No coughing up of blood.No change in color of mucus.No wheezing. Skin:  no rash or lesions. No jaundice GU:  no dysuria, change in color of urine, no urgency or frequency. No straining to urinate.  No flank pain.  Musculoskeletal:  No joint pain or no joint swelling. No decreased range of motion. No back pain.  Psych:  No change in mood or affect. No depression or anxiety. No memory loss.  Neuro: no localizing neurological complaints, no tingling, no weakness, no double vision, no gait abnormality, no slurred speech, no   All systems reviewed and apart from HOPI all are negative  Past Medical History:   Past Medical History:  Diagnosis Date   A-fib (HCC)    Alcohol abuse    Anxiety    Depression    Hypertension       No past surgical history on file.  Social History:  Ambulatory   Independently      reports that he has quit smoking. His smoking use included cigarettes. He has  never used smokeless tobacco. He reports current alcohol use. He reports that he does not use drugs.     Family History:  Family History  Problem Relation Age of Onset   Anxiety disorder Brother    Cancer Neg Hx    Diabetes Neg Hx    Stroke Neg Hx    Heart disease Neg Hx     Allergies: No Known Allergies   Prior to Admission medications   Medication Sig Start Date End Date Taking? Authorizing Provider  escitalopram (LEXAPRO) 20 MG tablet Take 1 tablet (20 mg total) by mouth daily. For mood 12/20/18   Aldean Baker, NP  hydrOXYzine (ATARAX/VISTARIL) 25 MG tablet Take 1 tablet (25 mg total) by mouth 3 (three) times daily as needed for anxiety or nausea. 12/19/18   Aldean Baker, NP  lisinopril (PRINIVIL,ZESTRIL) 20 MG tablet Take 1 tablet (20 mg total) by mouth daily. For high blood pressure 12/20/18   Aldean Baker, NP  metoprolol tartrate 75 MG TABS Take 150 mg by mouth 2 (two) times daily. For high blood pressure 12/19/18   Aldean Baker, NP  traZODone (DESYREL) 50 MG tablet Take 1 tablet (50 mg total) by mouth at bedtime as needed for sleep. 12/19/18   Aldean Baker, NP   Physical Exam: Blood pressure 136/77, pulse (!) 115, temperature 98.5 F (36.9 C), temperature source Rectal, resp. rate (!) 23, height  6\' 2"  (1.88 m), weight (!) 147.4 kg, SpO2 92 %. 1. General:  in No  Acute distress   Chronically ill   -appearing 2. Psychological: Alert and  Oriented 3. Head/ENT:    Dry Mucous Membranes                          Head Non traumatic, neck supple                           Poor Dentition 4. SKIN: normal  Skin turgor,  Skin clean Dry and intact multiple spider angioma present, erythema present icterus sclera and significant jaundice 5. Heart: Regular rate and rhythm no  Murmur, no Rub or gallop 6. Lungs: Clear to auscultation bilaterally, no wheezes or crackles   7. Abdomen: Soft, non-tender,   distended obese  bowel sounds present 8. Lower extremities: no clubbing,  cyanosis, 2+ edema 9. Neurologically Grossly intact, moving all 4 extremities equally  10. MSK: Normal range of motion   LABS:     Recent Labs  Lab 03/07/19 1543  WBC 5.0  NEUTROABS 3.5  HGB 17.3*  HCT 48.8  MCV 99.0  PLT 73*   Basic Metabolic Panel: Recent Labs  Lab 03/07/19 1543  NA 131*  K 3.4*  CL 89*  CO2 29  GLUCOSE 103*  BUN 13  CREATININE 0.49*  CALCIUM 8.8*      Recent Labs  Lab 03/07/19 1543  AST 328*  ALT 112*  ALKPHOS 116  BILITOT 25.4*  PROT 6.7  ALBUMIN 2.5*   Recent Labs  Lab 03/07/19 1543  LIPASE 165*   Recent Labs  Lab 03/07/19 1543  AMMONIA 90*      HbA1C: No results for input(s): HGBA1C in the last 72 hours. CBG: No results for input(s): GLUCAP in the last 168 hours.    Urine analysis:    Component Value Date/Time   COLORURINE BROWN (A) 03/07/2019 1546   APPEARANCEUR CLOUDY (A) 03/07/2019 1546   LABSPEC  03/07/2019 1546    TEST NOT REPORTED DUE TO COLOR INTERFERENCE OF URINE PIGMENT   PHURINE  03/07/2019 1546    TEST NOT REPORTED DUE TO COLOR INTERFERENCE OF URINE PIGMENT   GLUCOSEU (A) 03/07/2019 1546    TEST NOT REPORTED DUE TO COLOR INTERFERENCE OF URINE PIGMENT   HGBUR (A) 03/07/2019 1546    TEST NOT REPORTED DUE TO COLOR INTERFERENCE OF URINE PIGMENT   BILIRUBINUR (A) 03/07/2019 1546    TEST NOT REPORTED DUE TO COLOR INTERFERENCE OF URINE PIGMENT   KETONESUR (A) 03/07/2019 1546    TEST NOT REPORTED DUE TO COLOR INTERFERENCE OF URINE PIGMENT   PROTEINUR (A) 03/07/2019 1546    TEST NOT REPORTED DUE TO COLOR INTERFERENCE OF URINE PIGMENT   NITRITE (A) 03/07/2019 1546    TEST NOT REPORTED DUE TO COLOR INTERFERENCE OF URINE PIGMENT   LEUKOCYTESUR (A) 03/07/2019 1546    TEST NOT REPORTED DUE TO COLOR INTERFERENCE OF URINE PIGMENT   Cultures:    Component Value Date/Time   SDES URINE, CLEAN CATCH 05/21/2015 0151   SPECREQUEST NONE 05/21/2015 0151   REPTSTATUS 05/23/2015 FINAL 05/21/2015 0151     Radiological  Exams on Admission: Dg Chest 2 View  Result Date: 03/07/2019 CLINICAL DATA:  Shortness of breath for 2 weeks. EXAM: CHEST - 2 VIEW COMPARISON:  Chest radiograph 04/28/2018 FINDINGS: Monitoring leads overlie the patient. Stable cardiomegaly. Low lung volumes.  Heterogeneous opacities right lower lung. No pleural effusion or pneumothorax. Thoracic spine degenerative changes. IMPRESSION: Heterogeneous opacities right lower lung may represent atelectasis or infection. Electronically Signed   By: Annia Belt M.D.   On: 03/07/2019 17:23    Chart has been reviewed    Assessment/Plan  47 y.o. male with medical history significant of alcohol abuse depression, anxiety, atrial fibrillation, hypertension Admitted for liver failure  Present on Admission:  Hepatic failure (HCC) - MELD score 30 estimated 25-month mortality 52%  Appreciate GI consult most likely cause for hepatic failure alcohol abuse  Hyponatremia in the setting of hepatic failure obtain urine electrolytes  Hypertension -continue metoprolol decrease the dose to allow room for diuresis  Alcohol abuse/  Alcohol withdrawal (HCC) - CIWA protocol  GERD (gastroesophageal reflux disease) -we will continue home medications  Alcoholic cirrhosis of liver with ascites (HCC) appreciate GI consult spoke at length regarding importance of quitting patient at this point interested in quitting alcohol unfortunately degree of liver failure is advanced overall suspect very poor prognosis discussed at length with patient who at this point wishes to be DNR/DNI but otherwise continue with interventions  Elevated lipase -in the setting of liver disease no evidence of pancreatitis  Elevated INR -in the setting of liver disease  Thrombocytopenia (HCC) -likely secondary to cirrhosis we will continue to monitor  Anasarca -secondary to to hypoalbuminemia attempt to diuresis.low-dose of Lasix and he see how he does may be able to add spironolactone  Acute  hepatic encephalopathy initiate lactulose and follow ammonia level  Melena In the setting of elevated INR and thrombocytopenia Hemoccult stool hemoglobin at this point stable follow hemoglobin  Hypokalemia we will replace and check magnesium levels Other plan as per orders. History of atrial fibrillation?  currently appears to be in sinus  Not a candidate for anticoagulation DVT prophylaxis:  SCD    Code Status:    DNR/DNI as per patient   I had personally discussed CODE STATUS with patient    Family Communication:   Family not at  Bedside    Disposition Plan:     To home once workup is complete and patient is stable                      Would benefit from PT/OT eval prior to DC  Ordered                                       Social Work  [employer]                    Nutrition    consulted                                   Consults called:  Gi   Admission status:    inpatient     Expect 2 midnight stay secondary to severity of patient's current illness including     Severe lab/radiological/exam abnormalities including: Evidence of acute liver failure   and extensive comorbidities including:  substance abuse  liver disease       That are currently affecting medical management.   I expect  patient to be hospitalized for 2 midnights requiring inpatient medical care.  Patient is at high risk for adverse outcome (such as loss of life or disability)  if not treated.  Indication for inpatient stay as follows:    change from baseline regarding mental status    inability to maintain oral hydration    Need for operative/procedural  intervention    Need for IV  medications      Level of care      tele  For 12H  Natavia Sublette 03/07/2019, 9:59 PM    [facility]     after 2 AM please page floor coverage PA If 7AM-7PM, please contact the day team taking care of the patient using [email or web address]

## 2019-03-07 NOTE — ED Triage Notes (Signed)
Per EMS, Pt coming from home, complaining of SOB, no appetite, and weakness. HTX of ETOH abuse, presenting with jaundice.

## 2019-03-07 NOTE — ED Notes (Signed)
Rectal temp 98.5

## 2019-03-07 NOTE — ED Provider Notes (Signed)
[facility]-EMERGENCY DEPT Provider Note   CSN: [id number] Arrival date & time: 03/07/19  1527    History   Chief Complaint Chief Complaint  Patient presents with  . Weakness  . Jaundice  . Alcohol Problem    HPI [facility] is a 48 y.o. male.     The history is provided by the patient.  Weakness  Severity:  Moderate Onset quality:  Gradual Timing:  Constant Progression:  Worsening Chronicity:  New Context: alcohol use (no use in 3 days)   Relieved by:  Nothing Worsened by:  Nothing Associated symptoms: shortness of breath   Associated symptoms: no abdominal pain, no arthralgias, no chest pain, no cough, no diarrhea, no dysuria, no fever, no loss of consciousness, no nausea, no seizures, no stroke symptoms, no syncope, no urgency, no vision change and no vomiting     Past Medical History:  Diagnosis Date  . A-fib (HCC)   . Alcohol abuse   . Anxiety   . Depression   . Hypertension     Patient Active Problem List   Diagnosis Date Noted  . Alcohol dependence with withdrawal, uncomplicated (HCC) 12/14/2018  . Major depressive disorder, recurrent, severe without psychotic features (HCC) 12/14/2018  . Major depressive disorder, recurrent severe without psychotic features (HCC) 12/14/2018  . Alcohol abuse with alcohol-induced mood disorder (HCC) 12/11/2017  . Alcohol abuse 11/26/2017  . Alcohol abuse with intoxication (HCC) 11/26/2017  . Alcohol withdrawal (HCC) 11/26/2017  . GERD (gastroesophageal reflux disease) 11/26/2017  . Abnormal LFTs 11/26/2017  . Abdominal pain 11/26/2017  . Insomnia 07/07/2015  . Anxiety and depression   . Hypertension 07/10/2012    No past surgical history on file.      Home Medications    Prior to Admission medications   Medication Sig Start Date End Date Taking? Authorizing Provider  escitalopram (LEXAPRO) 20 MG tablet Take 1 tablet (20 mg total) by mouth daily. For mood 12/20/18   Aldean BakerSykes, Janet E, NP   hydrOXYzine (ATARAX/VISTARIL) 25 MG tablet Take 1 tablet (25 mg total) by mouth 3 (three) times daily as needed for anxiety or nausea. 12/19/18   Aldean BakerSykes, Janet E, NP  lisinopril (PRINIVIL,ZESTRIL) 20 MG tablet Take 1 tablet (20 mg total) by mouth daily. For high blood pressure 12/20/18   Aldean BakerSykes, Janet E, NP  metoprolol tartrate 75 MG TABS Take 150 mg by mouth 2 (two) times daily. For high blood pressure 12/19/18   Aldean BakerSykes, Janet E, NP  traZODone (DESYREL) 50 MG tablet Take 1 tablet (50 mg total) by mouth at bedtime as needed for sleep. 12/19/18   Aldean BakerSykes, Janet E, NP    Family History Family History  Problem Relation Age of Onset  . Anxiety disorder Brother   . Cancer Neg Hx   . Diabetes Neg Hx   . Stroke Neg Hx   . Heart disease Neg Hx     Social History Social History   Tobacco Use  . Smoking status: Former Smoker    Types: Cigarettes  . Smokeless tobacco: Never Used  . Tobacco comment: quit 2013  Substance Use Topics  . Alcohol use: Yes    Comment: Pt states he drinks about 1 beer to a 1/5th of liquor per day,  and his last drink  last nite at 2am  . Drug use: No     Allergies   Patient has no known allergies.   Review of Systems Review of Systems  Constitutional: Negative for chills and fever.  HENT: Negative for ear pain and sore throat.   Eyes: Negative for pain and visual disturbance.  Respiratory: Positive for shortness of breath. Negative for cough.   Cardiovascular: Negative for chest pain, palpitations and syncope.  Gastrointestinal: Negative for abdominal distention, abdominal pain, diarrhea, nausea and vomiting.  Genitourinary: Negative for dysuria, hematuria and urgency.  Musculoskeletal: Negative for arthralgias and back pain.  Skin: Negative for color change and rash.  Neurological: Positive for weakness. Negative for seizures, loss of consciousness and syncope.  All other systems reviewed and are negative.    Physical Exam Updated Vital Signs  ED  Triage Vitals  Enc Vitals Group     BP 03/07/19 1544 (!) 140/91     Pulse Rate 03/07/19 1544 (!) 113     Resp 03/07/19 1544 20     Temp 03/07/19 1544 99.4 F (37.4 C)     Temp src --      SpO2 03/07/19 1532 92 %     Weight 03/07/19 1544 (!) 325 lb (147.4 kg)     Height 03/07/19 1544 6\' 2"  (1.88 m)     Head Circumference --      Peak Flow --      Pain Score 03/07/19 1544 0     Pain Loc --      Pain Edu? --      Excl. in GC? --     Physical Exam Vitals signs and nursing note reviewed.  Constitutional:      Appearance: He is well-developed.  HENT:     Head: Normocephalic and atraumatic.  Eyes:     Extraocular Movements: Extraocular movements intact.     Conjunctiva/sclera: Conjunctivae normal.     Pupils: Pupils are equal, round, and reactive to light.     Comments: Scleral icterus   Neck:     Musculoskeletal: Neck supple.  Cardiovascular:     Rate and Rhythm: Normal rate and regular rhythm.     Pulses: Normal pulses.     Heart sounds: Normal heart sounds. No murmur.  Pulmonary:     Effort: Pulmonary effort is normal. No respiratory distress.     Breath sounds: Normal breath sounds. No decreased breath sounds, wheezing, rhonchi or rales.  Abdominal:     Palpations: Abdomen is soft.     Tenderness: There is no abdominal tenderness. There is no guarding or rebound.     Comments: Distention   Musculoskeletal:     Right lower leg: Edema (2+ pitting edema) present.     Left lower leg: Edema (2+) present.  Skin:    General: Skin is warm and dry.     Capillary Refill: Capillary refill takes less than 2 seconds.     Comments: Jaundice   Neurological:     General: No focal deficit present.     Mental Status: He is alert.  Psychiatric:        Mood and Affect: Mood normal.    EMERGENCY DEPARTMENT [place] ACITES EXAM "Study: Limited Abdominal Ultrasound for Evaluation of Free Fluid"  INDICATIONS: Distention  PERFORMED BY: Myself IMAGES ARCHIVED?: Yes VIEWS USES: Right  upper quad, Left upper quad, Right lower quad and Left lower quad INTERPRETATION: Free fluid present but over minimal, no large pockets   ED Treatments / Results  Labs (all labs ordered are listed, but only abnormal results are displayed) Labs Reviewed  CBC WITH DIFFERENTIAL/PLATELET - Abnormal; Notable for the following components:      Result Value   Hemoglobin 17.3 (*)  MCH 35.1 (*)    RDW 19.5 (*)    Platelets 73 (*)    nRBC 1.2 (*)    Lymphs Abs 0.5 (*)    Abs Immature Granulocytes 0.13 (*)    All other components within normal limits  COMPREHENSIVE METABOLIC PANEL - Abnormal; Notable for the following components:   Sodium 131 (*)    Potassium 3.4 (*)    Chloride 89 (*)    Glucose, Bld 103 (*)    Creatinine, Ser 0.49 (*)    Calcium 8.8 (*)    Albumin 2.5 (*)    AST 328 (*)    ALT 112 (*)    Total Bilirubin 25.4 (*)    All other components within normal limits  LIPASE, BLOOD - Abnormal; Notable for the following components:   Lipase 165 (*)    All other components within normal limits  AMMONIA - Abnormal; Notable for the following components:   Ammonia 90 (*)    All other components within normal limits  URINALYSIS, ROUTINE W REFLEX MICROSCOPIC - Abnormal; Notable for the following components:   Color, Urine BROWN (*)    APPearance CLOUDY (*)    Glucose, UA   (*)    Value: TEST NOT REPORTED DUE TO COLOR INTERFERENCE OF URINE PIGMENT   Hgb urine dipstick   (*)    Value: TEST NOT REPORTED DUE TO COLOR INTERFERENCE OF URINE PIGMENT   Bilirubin Urine   (*)    Value: TEST NOT REPORTED DUE TO COLOR INTERFERENCE OF URINE PIGMENT   Ketones, ur   (*)    Value: TEST NOT REPORTED DUE TO COLOR INTERFERENCE OF URINE PIGMENT   Protein, ur   (*)    Value: TEST NOT REPORTED DUE TO COLOR INTERFERENCE OF URINE PIGMENT   Nitrite   (*)    Value: TEST NOT REPORTED DUE TO COLOR INTERFERENCE OF URINE PIGMENT   Leukocytes,Ua   (*)    Value: TEST NOT REPORTED DUE TO COLOR  INTERFERENCE OF URINE PIGMENT   All other components within normal limits  LACTIC ACID, PLASMA - Abnormal; Notable for the following components:   Lactic Acid, Venous 2.2 (*)    All other components within normal limits  PROTIME-INR - Abnormal; Notable for the following components:   Prothrombin Time 24.9 (*)    INR 2.3 (*)    All other components within normal limits  URINALYSIS, MICROSCOPIC (REFLEX) - Abnormal; Notable for the following components:   Bacteria, UA RARE (*)    All other components within normal limits  URINE CULTURE  CULTURE, BLOOD (ROUTINE X 2)  CULTURE, BLOOD (ROUTINE X 2)  BRAIN NATRIURETIC PEPTIDE  INFLUENZA PANEL BY PCR (TYPE A & B)  LACTIC ACID, PLASMA  I-STAT TROPONIN, ED    EKG None  Radiology Dg Chest 2 View  Result Date: 03/07/2019 CLINICAL DATA:  Shortness of breath for 2 weeks. EXAM: CHEST - 2 VIEW COMPARISON:  Chest radiograph 04/28/2018 FINDINGS: Monitoring leads overlie the patient. Stable cardiomegaly. Low lung volumes. Heterogeneous opacities right lower lung. No pleural effusion or pneumothorax. Thoracic spine degenerative changes. IMPRESSION: Heterogeneous opacities right lower lung may represent atelectasis or infection. Electronically Signed   By: Annia Belt M.D.   On: 03/07/2019 17:23    Procedures .Critical Care Performed by: Virgina Norfolk, DO Authorized by: Virgina Norfolk, DO   Critical care provider statement:    Critical care time (minutes):  50   Critical care was necessary to treat or prevent imminent  or life-threatening deterioration of the following conditions:  Hepatic failure   Critical care was time spent personally by me on the following activities:  Blood draw for specimens, development of treatment plan with patient or surrogate, discussions with consultants, discussions with primary provider, evaluation of patient's response to treatment, obtaining history from patient or surrogate, ordering and performing treatments and  interventions, ordering and review of laboratory studies, ordering and review of radiographic studies, pulse oximetry, re-evaluation of patient's condition and review of old charts   I assumed direction of critical care for this patient from another provider in my specialty: no     (including critical care time)  Medications Ordered in ED Medications  piperacillin-tazobactam (ZOSYN) IVPB 3.375 g (3.375 g Intravenous New Bag/Given 03/07/19 1817)  LORazepam (ATIVAN) tablet 2 mg (2 mg Oral Given 03/07/19 1626)  sodium chloride 0.9 % bolus 500 mL (500 mLs Intravenous New Bag/Given 03/07/19 1820)     Initial Impression / Assessment and Plan / ED Course  I have reviewed the triage vital signs and the nursing notes.  Pertinent labs & imaging results that were available during my care of the patient were reviewed by me and considered in my medical decision making (see chart for details).     Sukhdeep Wieting is a 48 year old male with history of alcohol abuse, depression, hypertension who presents to the ED with weakness.  Patient with tachycardia but otherwise unremarkable vitals.  Rectal temperature is 98.5.  Patient states that he has had generalized weakness over the last several days.  Has noticed jaundice.  States that he was in alcohol rehab over the winter time in December but has been heavily drinking about 3 bottles of wine for the past 3 months.  Has not had any alcohol over the last 2 days as he is motivated to get sober again.  However he has noticed increased confusion, shortness of breath, generalized weakness, jaundice.  States he has no history of liver failure.  Has no abdominal pain but states that he does feel distended.  He states that his legs have been swelling as well.  He has no abdominal tenderness on exam.  Bedside ultrasound showed no large pockets of ascites.  Patient has 2+ pitting edema bilaterally.  Overall clear breath sounds.  Suspect that patient likely has hepatic  encephalopathy likely from alcohol abuse and likely cirrhosis.  However will initiate infectious work-up given tachycardia, overall vague symptoms.  Patient had EKG that showed sinus tachycardia.  No signs of ischemic changes.  Troponin within normal limits, BNP within normal limits.  Doubt cardiac process, doubt heart failure. CXR with some atelectasis vs infiltrate although no cough and no sputum production. Doubt PE, doubt pneumonia. Kidney function within normal limits.  Patient with elevated liver enzymes with AST elevation twice of ALT in about the 300s.  Lipase elevated to 165.  Ammonia elevated to 90.  Patient with lactic acid of 2.2.  Patient with bilirubin is 25.  Urinalysis overall difficult to interpret but no bacteria.  Patient likely with hepatic encephalopathy, likely from liver failure from alcohol use.  Patient with an INR 2.3, platelets in 70s.  Right upper quadrant ultrasound has been ordered and shows overall signs of severe diffuse fatty infiltration of the liver.  Possible portal venous thrombosis and they recommend a CT scan.  No obvious stones.  Likely patient has cirrhosis and less likely thrombosis. CT ordered and pending for inpatient team. Patient given IV Zosyn empirically to cover for possible infectious  process although less likely concern for cholangitis/SBP as patient does not have any abdominal pain.  Blood cultures have been collected.  Dr. Loreta Ave with gastroenterology has been consulted and will evaluate the patient.  Will admit the patient to hospitalist service for further care for liver failure/elevated bilirubin.  Overall patient with good mental status throughout my care and unremarkable vitals.  This chart was dictated using voice recognition software.  Despite best efforts to proofread,  errors can occur which can change the documentation meaning.    Final Clinical Impressions(s) / ED Diagnoses   Final diagnoses:  Pain  Hyperbilirubinemia  Alcohol abuse  Jaundice   Transaminitis  Hepatic encephalopathy (HCC)  Acute liver failure without hepatic coma  Thrombocytopenia [facility])    ED Discharge Orders    None       Virgina Norfolk, DO 03/07/19 2011

## 2019-03-07 NOTE — ED Notes (Signed)
Bed: WA21 Expected date:  Expected time:  Means of arrival:  Comments: 48 yo weakness, jaundice

## 2019-03-08 ENCOUNTER — Encounter (HOSPITAL_COMMUNITY): Payer: Self-pay

## 2019-03-08 LAB — LACTIC ACID, PLASMA
LACTIC ACID, VENOUS: 2.3 mmol/L — AB (ref 0.5–1.9)
Lactic Acid, Venous: 2.7 mmol/L (ref 0.5–1.9)
Lactic Acid, Venous: 2.4 mmol/L (ref 0.5–1.9)

## 2019-03-08 LAB — COMPREHENSIVE METABOLIC PANEL
ALK PHOS: 95 U/L (ref 38–126)
ALT: 99 U/L — ABNORMAL HIGH (ref 0–44)
AST: 284 U/L — ABNORMAL HIGH (ref 15–41)
Albumin: 2.1 g/dL — ABNORMAL LOW (ref 3.5–5.0)
Anion gap: 13 (ref 5–15)
BUN: 14 mg/dL (ref 6–20)
CO2: 29 mmol/L (ref 22–32)
CREATININE: 0.54 mg/dL — AB (ref 0.61–1.24)
Calcium: 8.5 mg/dL — ABNORMAL LOW (ref 8.9–10.3)
Chloride: 92 mmol/L — ABNORMAL LOW (ref 98–111)
GFR calc Af Amer: >60 mL/min (ref >60)
GFR calc non Af Amer: >60 mL/min (ref >60)
Glucose, Bld: 112 mg/dL — ABNORMAL HIGH (ref 70–99)
Potassium: 3.6 mmol/L (ref 3.5–5.1)
Sodium: 134 mmol/L — ABNORMAL LOW (ref 135–145)
Total Bilirubin: 21.8 mg/dL (ref 0.3–1.2)
Total Protein: 5.9 g/dL — ABNORMAL LOW (ref 6.5–8.1)

## 2019-03-08 LAB — LIPASE, BLOOD: Lipase: 158 U/L — ABNORMAL HIGH (ref 11–51)

## 2019-03-08 LAB — CBC
HCT: 47.9 % (ref 39.0–52.0)
Hemoglobin: 16.4 g/dL (ref 13.0–17.0)
MCH: 34.9 pg — ABNORMAL HIGH (ref 26.0–34.0)
MCHC: 34.2 g/dL (ref 30.0–36.0)
MCV: 101.9 fL — ABNORMAL HIGH (ref 80.0–100.0)
Platelets: 79 10*3/uL — ABNORMAL LOW (ref 150–400)
RBC: 4.7 MIL/uL (ref 4.22–5.81)
RDW: 19.7 % — AB (ref 11.5–15.5)
WBC: 3.9 10*3/uL — ABNORMAL LOW (ref 4.0–10.5)
nRBC: 0.8 % — ABNORMAL HIGH (ref 0.0–0.2)

## 2019-03-08 LAB — AMMONIA: Ammonia: 75 umol/L — ABNORMAL HIGH (ref 9–35)

## 2019-03-08 LAB — MAGNESIUM: Magnesium: 2.3 mg/dL (ref 1.7–2.4)

## 2019-03-08 LAB — HEMOGLOBIN A1C
Hgb A1c MFr Bld: 4.8 % (ref 4.8–5.6)
Mean Plasma Glucose: 91.06 mg/dL

## 2019-03-08 LAB — PROTIME-INR
INR: 2.5 — ABNORMAL HIGH (ref 0.8–1.2)
Prothrombin Time: 26.4 seconds — ABNORMAL HIGH (ref 11.4–15.2)

## 2019-03-08 LAB — OSMOLALITY, URINE: OSMOLALITY UR: 353 mosm/kg (ref 300–900)

## 2019-03-08 LAB — TSH: TSH: 1.095 u[IU]/mL (ref 0.350–4.500)

## 2019-03-08 LAB — PHOSPHORUS: Phosphorus: 2.7 mg/dL (ref 2.5–4.6)

## 2019-03-08 MED ORDER — FUROSEMIDE 10 MG/ML IJ SOLN
20.0000 mg | Freq: Two times a day (BID) | INTRAMUSCULAR | Status: DC
  Administered 2019-03-08 – 2019-03-10 (×4): 20 mg via INTRAVENOUS
  Filled 2019-03-08 (×4): qty 2

## 2019-03-08 MED ORDER — ESCITALOPRAM OXALATE 20 MG PO TABS
20.0000 mg | ORAL_TABLET | Freq: Every day | ORAL | Status: DC
  Administered 2019-03-08 – 2019-03-16 (×9): 20 mg via ORAL
  Filled 2019-03-08 (×9): qty 1

## 2019-03-08 MED ORDER — PREDNISOLONE 5 MG PO TABS
40.0000 mg | ORAL_TABLET | Freq: Every day | ORAL | Status: DC
  Administered 2019-03-08 – 2019-03-12 (×5): 40 mg via ORAL
  Filled 2019-03-08 (×5): qty 8

## 2019-03-08 NOTE — Progress Notes (Signed)
CRITICAL VALUE ALERT  Critical Value:  Lactic acic 2.7  Date & Time Notied:  [id number]  Provider Notified: yes  Orders Received/Actions taken:no

## 2019-03-08 NOTE — Evaluation (Signed)
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 Dx of hepatic failure, EtOH withdrawal.    Clinical Impression   Pt was admitted for the above. He was observed to be coming out of bathroom unassisted. Steady:  No LOB. 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 Issued AE, practiced with it and reviewed energy conservation with handout given. Will follow in acute setting with mod I level goals.    Follow Up Recommendations  Supervision/Assistance - 24 hour    Equipment Recommendations  None recommended by OT(likely)    Recommendations for Other Services       Precautions / Restrictions Precautions Precautions: Fall Precaution Comments: dizzy in sitting and standing; denies h/o falls Restrictions Weight Bearing Restrictions: No      Mobility Bed Mobility Overal bed mobility: Modified Independent             General bed mobility comments: used bedrail  Transfers Overall transfer level: Needs assistance Equipment used: None Transfers: Sit to/from Stand Sit to Stand: Min guard         General transfer comment: mod I coming out of bathroom; not unsteady; no LOB    Balance Overall balance assessment: Needs assistance   Sitting balance-Leahy Scale: Good       Standing balance-Leahy Scale: Fair                             ADL either performed or assessed with clinical judgement   ADL Overall ADL's : Needs assistance/impaired Eating/Feeding: Set up   Grooming: Minimal assistance Grooming Details (indicate cue type and reason): open  containers Upper Body Bathing: Set up   Lower Body Bathing: Set up;Sit to/from stand;With adaptive equipment   Upper Body Dressing : Set up   Lower Body Dressing: Minimal assistance;Sit to/from stand;With adaptive equipment   Toilet Transfer: Modified Independent   Toileting- Clothing Manipulation and Hygiene: Modified independent Toileting - Clothing Manipulation Details (indicate cue type and reason): hygiene       General ADL Comments: pt observed coming out of bathroom; mod I level. He normally crosses legs for adls and is having difficulty now.  Issued reacher, wide sock aide, and long sponge. Pt feels endurance is his big problem right now.  He cannot stand long.  Educated on energy conservation and gave him a handout. Pt did need assistance donning sock on sock aide due to tremor     Vision         Perception     Praxis      Pertinent Vitals/Pain Pain Assessment: No/denies pain     Hand Dominance     Extremity/Trunk Assessment Upper Extremity Assessment Upper Extremity Assessment: Overall WFL for tasks assessed(tremor present)   Lower Extremity Assessment Lower Extremity Assessment: Overall WFL for tasks assessed   Cervical / Trunk Assessment Cervical / Trunk Assessment: Normal   Communication Communication Communication: No difficulties   Cognition Arousal/Alertness: Awake/alert Behavior During Therapy: WFL for tasks assessed/performed Overall Cognitive Status: Within Functional Limits for tasks assessed  General Comments   BP 116/83 sitting.  HR 67    Exercises     Shoulder Instructions      Home Living Family/patient expects to be discharged to:: Private residence Living Arrangements: Parent(mother) Available Help at Discharge: Family;Available 24 hours/day Type of Home: Apartment Home Access: Stairs to enter Entrance Stairs-Number of Steps: 14 Entrance Stairs-Rails: Right Home Layout: One  level     Bathroom Shower/Tub: Chief Strategy Officer: Handicapped height     Home Equipment: None          Prior Functioning/Environment Level of Independence: Independent        Comments: he does cooking; mother takes care of herself        OT Problem List: Decreased activity tolerance;Decreased coordination;Impaired UE functional use;Decreased knowledge of use of DME or AE      OT Treatment/Interventions: Self-care/ADL training;Patient/family education;Energy conservation;DME and/or AE instruction    OT Goals(Current goals can be found in the care plan section) Acute Rehab OT Goals Patient Stated Goal: to have the endurance to do what he needs to do OT Goal Formulation: With patient Time For Goal Achievement: 03/22/19 Potential to Achieve Goals: Good ADL Goals Pt Will Perform Tub/Shower Transfer: Tub transfer;with modified independence;ambulating Additional ADL Goal #1: pt will gather clothes at mod I level with reacher, complete adl at this level, and initiate at least one rest break without cues for energy conservation  OT Frequency: Min 2X/week   Barriers to D/C:            Co-evaluation              AM-PAC OT "6 Clicks" Daily Activity     Outcome Measure Help from another person eating meals?: None Help from another person taking care of personal grooming?: A Little Help from another person toileting, which includes using toliet, bedpan, or urinal?: None Help from another person bathing (including washing, rinsing, drying)?: A Little Help from another person to put on and taking off regular upper body clothing?: A Little Help from another person to put on and taking off regular lower body clothing?: A Little 6 Click Score: 20   End of Session    Activity Tolerance: Patient tolerated treatment well Patient left: in bed;with call bell/phone within reach  OT Visit Diagnosis: Muscle weakness (generalized) (M62.81)                Time:  6720-9470 OT Time Calculation (min): 23 min Charges:  OT General Charges $OT Visit: 1 Visit OT Evaluation $OT Eval Low Complexity: 1 Low  Marica Otter, OTR/L Acute Rehabilitation Services [phone] [facility] pager [phone] office 03/08/2019  Dre Gamino 03/08/2019, 3:26 PM

## 2019-03-08 NOTE — Consult Note (Signed)
[facility]OSS [facility]  Reason for Consult: Alcoholic liver disease with jaundice. Referring Physician: THP.  Nicholas Price is an 48 y.o. male.  HPI: Nicholas Price is a 48 year old white male admitted through the emergency room and was along yesterday with a history of alcohol abuse complicated with alcoholic cirrhosis reflux paroxysmal atrial fibrillation complicated by weakness and shortness of breath. Patient gives a history of drinking 2-3 bottles of wine for the last several weeks now and has not been eating normally. He lives locally in [place] with his mother. He has had some edema of his lower legs and feels his belly is distended and also describes some brain fog. He denies any illicit drug use.  He admits to being depressed and has chosen to be a DNR.  On arrival in the ER he was found to have a total bilirubin of 25.4 with an AST of 320 and ALT of 112 alkaline phosphatase of 116 and albumin of 2.5 creatinine was 1.49 with a platelets of 73,000.  Patient denies having any abdominal pain, nausea or vomiting. There is no history of melena or hematochezia.  CT scan on admission revealed marked hepatic steatosis with evidence of portal hypertension and splenomegaly with upper abdominal varices.  Some thickening of the rectal wall was noted measuring up to 11 mm/ ?proctitis.  Patient denies any rectal symptoms.  CT also revealed a minimally milli-dilated gallbladder with probable sludge borderline diffuse wall thickenin.g  Past Medical History:  Diagnosis Date  . A-fib (HCC)   . Alcohol abuse   . Anxiety   . Depression   . Hypertension    History reviewed. No pertinent surgical history.  Family History  Problem Relation Age of Onset  . Anxiety disorder Brother   . Cancer Neg Hx   . Diabetes Neg Hx   . Stroke Neg Hx   . Heart disease Neg Hx    Social History:  reports that he has quit smoking. His smoking use included cigarettes. He has never used smokeless tobacco. He reports current  alcohol use. He reports that he does not use drugs.  Allergies: No Known Allergies  Medications: I have reviewed the patient's current medications.  Results for orders placed or performed during the hospital encounter of 03/07/19 (from the past 48 hour(s))  Acetaminophen level     Status: Abnormal   Collection Time: 03/07/19  3:27 PM  Result Value Ref Range   Acetaminophen (Tylenol), Serum <10 (L) 10 - 30 ug/mL    Comment: (NOTE) Therapeutic concentrations vary significantly. A range of 10-30 ug/mL  may be an effective concentration for many patients. However, some  are best treated at concentrations outside of this range. Acetaminophen concentrations >150 ug/mL at 4 hours after ingestion  and >50 ug/mL at 12 hours after ingestion are often associated with  toxic reactions. Performed at [facility], [address] [place], [place] [address]   Protime-INR     Status: Abnormal   Collection Time: 03/07/19  3:37 PM  Result Value Ref Range   Prothrombin Time 24.9 (H) 11.4 - 15.2 seconds   INR 2.3 (H) 0.8 - 1.2    Comment: (NOTE) INR goal varies based on device and disease states. Performed at [facility], [address] [place], [place] [address]   CBC with Differential     Status: Abnormal   Collection Time: 03/07/19  3:43 PM  Result Value Ref Range   WBC 5.0 4.0 - 10.5 K/uL  RBC 4.93 4.22 - 5.81 MIL/uL   Hemoglobin 17.3 (H) 13.0 - 17.0 g/dL   HCT 40.9 81.1 - 91.4 %   MCV 99.0 80.0 - 100.0 fL   MCH 35.1 (H) 26.0 - 34.0 pg   MCHC 35.5 30.0 - 36.0 g/dL   RDW 78.2 (H) 95.6 - 21.3 %   Platelets 73 (L) 150 - 400 K/uL    Comment: PLATELET COUNT CONFIRMED BY SMEAR SPECIMEN CHECKED FOR CLOTS Immature Platelet Fraction may be clinically indicated, consider ordering this additional test [id number]    nRBC 1.2 (H) 0.0 - 0.2 %   Neutrophils Relative % 69 %   Neutro Abs 3.5 1.7 - 7.7 K/uL   Lymphocytes Relative 11 %   Lymphs Abs 0.5 (L)  0.7 - 4.0 K/uL   Monocytes Relative 15 %   Monocytes Absolute 0.7 0.1 - 1.0 K/uL   Eosinophils Relative 1 %   Eosinophils Absolute 0.1 0.0 - 0.5 K/uL   Basophils Relative 1 %   Basophils Absolute 0.0 0.0 - 0.1 K/uL   Immature Granulocytes 3 %   Abs Immature Granulocytes 0.13 (H) 0.00 - 0.07 K/uL    Comment: Performed at [facility], [address] [place], [place] [address]  Comprehensive metabolic panel     Status: Abnormal   Collection Time: 03/07/19  3:43 PM  Result Value Ref Range   Sodium 131 (L) 135 - 145 mmol/L   Potassium 3.4 (L) 3.5 - 5.1 mmol/L   Chloride 89 (L) 98 - 111 mmol/L   CO2 29 22 - 32 mmol/L   Glucose, Bld 103 (H) 70 - 99 mg/dL   BUN 13 6 - 20 mg/dL   Creatinine, Ser 2.84 (L) 0.61 - 1.24 mg/dL   Calcium 8.8 (L) 8.9 - 10.3 mg/dL   Total Protein 6.7 6.5 - 8.1 g/dL   Albumin 2.5 (L) 3.5 - 5.0 g/dL   AST 132 (H) 15 - 41 U/L   ALT 112 (H) 0 - 44 U/L   Alkaline Phosphatase 116 38 - 126 U/L   Total Bilirubin 25.4 (HH) 0.3 - 1.2 mg/dL    Comment: CRITICAL RESULT CALLED TO, READ BACK BY AND VERIFIED WITH: JESSEE, B RN @1758  ON 03/07/2019 JACKSON,K    GFR calc non Af Amer >60 >60 mL/min   GFR calc Af Amer >60 >60 mL/min   Anion gap 13 5 - 15    Comment: Performed at [facility], [address], [place], [place] [address]  Lipase, blood     Status: Abnormal   Collection Time: 03/07/19  3:43 PM  Result Value Ref Range   Lipase 165 (H) 11 - 51 U/L    Comment: Performed at [facility], [address] [place], [place] [address]  Ammonia     Status: Abnormal   Collection Time: 03/07/19  3:43 PM  Result Value Ref Range   Ammonia 90 (H) 9 - 35 umol/L    Comment: Performed at [facility], [address], [place], [place] [address]  Brain natriuretic peptide     Status: None   Collection Time: 03/07/19  3:43 PM  Result Value Ref Range   B Natriuretic Peptide 37.1 0.0 - 100.0 pg/mL     Comment: Performed at [facility], [address], [place], [place] [address]  Urinalysis, Routine w reflex microscopic     Status: Abnormal   Collection Time: 03/07/19  3:46 PM  Result Value Ref  Range   Color, Urine BROWN (A) YELLOW    Comment: BIOCHEMICALS MAY BE AFFECTED BY COLOR   APPearance CLOUDY (A) CLEAR   Specific Gravity, Urine  1.005 - 1.030    TEST NOT REPORTED DUE TO COLOR INTERFERENCE OF URINE PIGMENT   pH  5.0 - 8.0    TEST NOT REPORTED DUE TO COLOR INTERFERENCE OF URINE PIGMENT   Glucose, UA (A) NEGATIVE mg/dL    TEST NOT REPORTED DUE TO COLOR INTERFERENCE OF URINE PIGMENT   Hgb urine dipstick (A) NEGATIVE    TEST NOT REPORTED DUE TO COLOR INTERFERENCE OF URINE PIGMENT   Bilirubin Urine (A) NEGATIVE    TEST NOT REPORTED DUE TO COLOR INTERFERENCE OF URINE PIGMENT   Ketones, ur (A) NEGATIVE mg/dL    TEST NOT REPORTED DUE TO COLOR INTERFERENCE OF URINE PIGMENT   Protein, ur (A) NEGATIVE mg/dL    TEST NOT REPORTED DUE TO COLOR INTERFERENCE OF URINE PIGMENT   Nitrite (A) NEGATIVE    TEST NOT REPORTED DUE TO COLOR INTERFERENCE OF URINE PIGMENT   Leukocytes,Ua (A) NEGATIVE    TEST NOT REPORTED DUE TO COLOR INTERFERENCE OF URINE PIGMENT    Comment: Performed at [facility], [address] [place], [place] [address]  Urinalysis, Microscopic (reflex)     Status: Abnormal   Collection Time: 03/07/19  3:46 PM  Result Value Ref Range   RBC / HPF NONE SEEN 0 - 5 RBC/hpf   WBC, UA 11-20 0 - 5 WBC/hpf   Bacteria, UA RARE (A) NONE SEEN   Squamous Epithelial / LPF NONE SEEN 0 - 5   Mucus PRESENT    WBC Casts, UA PRESENT    Amorphous Crystal PRESENT     Comment: Performed at [facility], [address] [place], [place] [address]  Lactic acid, plasma     Status: Abnormal   Collection Time: 03/07/19  4:01 PM  Result Value Ref Range   Lactic Acid, Venous 2.2 (HH) 0.5 - 1.9 mmol/L    Comment: ICTERUS AT THIS LEVEL MAY  AFFECT RESULT CRITICAL RESULT CALLED TO, READ BACK BY AND VERIFIED WITH: JESSEE,B RN @1747  ON 03/07/2019 JACKSON,K Performed at [facility], [address] [place], [place] [address]   Influenza panel by PCR (type A & B)     Status: None   Collection Time: 03/07/19  4:05 PM  Result Value Ref Range   Influenza A By PCR NEGATIVE NEGATIVE   Influenza B By PCR NEGATIVE NEGATIVE    Comment: (NOTE) The Xpert Xpress Flu assay is intended as an aid in the diagnosis of  influenza and should not be used as a sole basis for treatment.  This  assay is FDA approved for nasopharyngeal swab specimens only. Nasal  washings and aspirates are unacceptable for Xpert Xpress Flu testing. Performed at [facility], [address] [place], [place] [address]   I-Stat Troponin, ED (not at [facility])     Status: None   Collection Time: 03/07/19  5:22 PM  Result Value Ref Range   Troponin i, poc 0.00 0.00 - 0.08 ng/mL   Comment 3            Comment: Due to the release kinetics of cTnI, a negative result within the first hours of the onset of symptoms does not rule out myocardial infarction with certainty. If myocardial infarction is still suspected, repeat the test at appropriate intervals.   Magnesium  Status: None   Collection Time: 03/07/19  9:24 PM  Result Value Ref Range   Magnesium 2.1 1.7 - 2.4 mg/dL    Comment: Performed at [facility], [address] [place], [place] [address]  Phosphorus     Status: None   Collection Time: 03/07/19  9:24 PM  Result Value Ref Range   Phosphorus 2.9 2.5 - 4.6 mg/dL    Comment: Performed at [facility], [address], [place], [place] [address]  Creatinine, urine, random     Status: None   Collection Time: 03/07/19  9:36 PM  Result Value Ref Range   Creatinine, Urine 79.91 mg/dL    Comment: Performed at [facility], [address] [place], [place] [address]   Sodium, urine, random     Status: None   Collection Time: 03/07/19  9:36 PM  Result Value Ref Range   Sodium, Ur 27 mmol/L    Comment: Performed at [facility], [address] [place], [place] [address]  Osmolality, urine     Status: None   Collection Time: 03/07/19  9:37 PM  Result Value Ref Range   Osmolality, Ur 353 300 - 900 mOsm/kg    Comment: PERFORMED AT [facility] Performed at [facility], [address], [place], [place] [address]   POC occult blood, ED     Status: None   Collection Time: 03/07/19 10:21 PM  Result Value Ref Range   Fecal Occult Bld NEGATIVE NEGATIVE  Lactic acid, plasma     Status: Abnormal   Collection Time: 03/07/19 11:51 PM  Result Value Ref Range   Lactic Acid, Venous 2.7 (HH) 0.5 - 1.9 mmol/L    Comment: CRITICAL RESULT CALLED TO, READ BACK BY AND VERIFIED WITH: Levert Feinstein RN [id number] 03/08/2019 HILL K Performed at [facility], [address], [place], [place] [address]   Protime-INR     Status: Abnormal   Collection Time: 03/08/19  5:51 AM  Result Value Ref Range   Prothrombin Time 26.4 (H) 11.4 - 15.2 seconds   INR 2.5 (H) 0.8 - 1.2    Comment: (NOTE) INR goal varies based on device and disease states. Performed at [facility], [address], [place], [place] [address]   Ammonia     Status: Abnormal   Collection Time: 03/08/19  5:51 AM  Result Value Ref Range   Ammonia 75 (H) 9 - 35 umol/L    Comment: Performed at [facility], [address] [place], [place] [address]  Magnesium     Status: None   Collection Time: 03/08/19  5:51 AM  Result Value Ref Range   Magnesium 2.3 1.7 - 2.4 mg/dL    Comment: Performed at [facility], [address], [place], [place] [address]  Phosphorus     Status: None   Collection Time: 03/08/19  5:51 AM  Result Value Ref Range   Phosphorus 2.7 2.5 - 4.6 mg/dL    Comment: Performed at [facility], [address] [place], [place] [address]  TSH     Status: None   Collection Time: 03/08/19  5:51 AM  Result Value Ref Range   TSH 1.095 0.350 - 4.500 uIU/mL    Comment: Performed by a 3rd Generation assay with a functional sensitivity of <=0.01 uIU/mL. Performed at [facility], [address] [place], [place] [address]   Comprehensive  metabolic panel     Status: Abnormal   Collection Time: 03/08/19  5:51 AM  Result Value Ref Range   Sodium 134 (L) 135 - 145 mmol/L   Potassium 3.6 3.5 - 5.1 mmol/L   Chloride 92 (L) 98 - 111 mmol/L   CO2 29 22 - 32 mmol/L   Glucose, Bld 112 (H) 70 - 99 mg/dL   BUN 14 6 - 20 mg/dL   Creatinine, Ser 2.20 (L) 0.61 - 1.24 mg/dL   Calcium 8.5 (L) 8.9 - 10.3 mg/dL   Total Protein 5.9 (L) 6.5 - 8.1 g/dL   Albumin 2.1 (L) 3.5 - 5.0 g/dL   AST 254 (H) 15 - 41 U/L   ALT 99 (H) 0 - 44 U/L   Alkaline Phosphatase 95 38 - 126 U/L   Total Bilirubin 21.8 (HH) 0.3 - 1.2 mg/dL    Comment: CRITICAL VALUE NOTED.  VALUE IS CONSISTENT WITH PREVIOUSLY REPORTED AND CALLED VALUE.   GFR calc non Af Amer >60 >60 mL/min   GFR calc Af Amer >60 >60 mL/min   Anion gap 13 5 - 15    Comment: Performed at [facility], [address] [place], [place] [address]  CBC     Status: Abnormal   Collection Time: 03/08/19  5:51 AM  Result Value Ref Range   WBC 3.9 (L) 4.0 - 10.5 K/uL   RBC 4.70 4.22 - 5.81 MIL/uL   Hemoglobin 16.4 13.0 - 17.0 g/dL   HCT 37.6 28.3 - 15.1 %   MCV 101.9 (H) 80.0 - 100.0 fL   MCH 34.9 (H) 26.0 - 34.0 pg   MCHC 34.2 30.0 - 36.0 g/dL   RDW 76.1 (H) 60.7 - 37.1 %   Platelets 79 (L) 150 - 400 K/uL    Comment: REPEATED TO VERIFY Immature Platelet Fraction may be clinically indicated, consider ordering this additional test [id number] CONSISTENT WITH PREVIOUS RESULT    nRBC 0.8 (H) 0.0 - 0.2 %    Comment: Performed at [facility], [address], [place], [place] [address]   Lactic acid, plasma     Status: Abnormal   Collection Time: 03/08/19  5:51 AM  Result Value Ref Range   Lactic Acid, Venous 2.3 (HH) 0.5 - 1.9 mmol/L    Comment: CRITICAL RESULT CALLED TO, READ BACK BY AND VERIFIED WITH: ATTEMPTED [id number] 03/08/2019 HILL K Performed at [facility], [address], [place], [place] [address]     Dg Chest 2 View  Result Date: 03/07/2019 CLINICAL DATA:  Shortness of breath for 2 weeks. EXAM: CHEST - 2 VIEW COMPARISON:  Chest radiograph 04/28/2018 FINDINGS: Monitoring leads overlie the patient. Stable cardiomegaly. Low lung volumes. Heterogeneous opacities right lower lung. No pleural effusion or pneumothorax. Thoracic spine degenerative changes. IMPRESSION: Heterogeneous opacities right lower lung may represent atelectasis or infection. Electronically Signed   By: Annia Belt M.D.   On: 03/07/2019 17:23   Ct Abdomen Pelvis W Contrast  Result Date: 03/07/2019 CLINICAL DATA:  Abdominal distention and bilateral leg edema. Lethargy. Weakness. Alcohol abuse. Ex-smoker. Inability to establish patency of the portal vein at ultrasound earlier today due to difficulty penetrating the liver. EXAM: CT ABDOMEN AND PELVIS WITH CONTRAST TECHNIQUE: Multidetector CT imaging of the abdomen and pelvis was performed using the standard protocol following bolus administration of intravenous contrast. CONTRAST:  ISOVUE-300 IOPAMIDOL (ISOVUE-300) INJECTION 61% COMPARISON:  Right upper quadrant abdomen ultrasound obtained earlier today. FINDINGS: Lower chest: Mild bibasilar  atelectasis/scarring. Normal sized heart. Hepatobiliary: Marked diffuse low density of the liver relative to the spleen. The caudate lobe of the liver is significantly enlarged. Probable sludge in the gallbladder. The gallbladder is mildly dilated borderline wall thickening. No pericholecystic fluid or stranding. Pancreas: Unremarkable. No pancreatic ductal dilatation or surrounding inflammatory  changes. Spleen: Mildly enlarged. Adrenals/Urinary Tract: Adrenal glands are unremarkable. Kidneys are normal, without renal calculi, focal lesion, or hydronephrosis. Bladder is unremarkable. Stomach/Bowel: Stomach is within normal limits. Appendix appears normal. Concentric and eccentric rectal wall thickening, measuring 11 mm in maximum thickness distally. Otherwise, unremarkable colon. Normal appearing small bowel. Vascular/Lymphatic: Upper abdominal varices. Patent portal vein. No vascular occlusions are seen. No significant arterial abnormalities. No enlarged abdominal or pelvic lymph nodes. Reproductive: Prostate is unremarkable. Other: Moderate amount of free peritoneal fluid. Small umbilical and paraumbilical hernias containing fat. Musculoskeletal: Mild lower thoracic spine degenerative changes. IMPRESSION: 1. Changes of cirrhosis of the liver with marked hepatic steatosis and evidence of portal venous hypertension with associated splenomegaly and upper abdominal varices. 2. Moderate amount of ascites. 3. Concentric and eccentric rectal wall thickening, measuring 11 mm in maximum thickness distally. This could be due to proctitis. A neoplasm is less likely but not excluded. 4. Mildly dilated gallbladder with probable sludge and borderline diffuse wall thickening. The wall thickening may be due to hypoproteinemia. Electronically Signed   By: Beckie Salts M.D.   On: 03/07/2019 20:50   US Abdomen Limited Ruq  Result Date: 03/07/2019 CLINICAL DATA:  Right upper quadrant pain for 2 weeks. Elevated LFTs EXAM: ULTRASOUND ABDOMEN LIMITED RIGHT UPPER QUADRANT COMPARISON:  None. FINDINGS: Gallbladder: No visible stones. Gallbladder wall slightly thickened at 4 mm. Negative sonographic Murphy's. Common bile duct: Diameter: Normal caliber, 6 mm Liver: Diffusely increased echotexture compatible with fatty infiltration. No focal hepatic abnormality. Cannot visualized or confirm blood flow in the main portal vein.  IMPRESSION: Severe diffuse fatty infiltration of the liver. Cannot visualized or confirm portal venous blood flow. Consider further evaluation with CT with IV contrast to exclude portal venous thrombosis. Slight gallbladder wall thickening without visible stones or sonographic Murphy sign. This likely is related to liver disease. Electronically Signed   By: Charlett Nose M.D.   On: 03/07/2019 19:11   Review of Systems  Constitutional: Positive for malaise/fatigue. Negative for chills, diaphoresis, fever and weight loss.  HENT: Negative.   Eyes: Negative.   Respiratory: Positive for shortness of breath. Negative for cough, hemoptysis, sputum production and wheezing.   Cardiovascular: Positive for leg swelling.  Gastrointestinal: Negative for abdominal pain, blood in stool, constipation, diarrhea, heartburn, melena, nausea and vomiting.  Genitourinary: Negative.   Musculoskeletal: Positive for back pain, joint pain and myalgias. Negative for falls and neck pain.  Skin: Negative.   Neurological: Positive for focal weakness.  Endo/Heme/Allergies: Negative.   Psychiatric/Behavioral: Positive for depression and substance abuse. Negative for hallucinations, memory loss and suicidal ideas. The patient is nervous/anxious. The patient does not have insomnia.    Blood pressure 111/86, pulse 72, temperature 97.8 F (36.6 C), temperature source Oral, resp. rate (!) 25, height 6\' 2"  (1.88 m), weight (!) 142.7 kg, SpO2 93 %. Physical Exam  Constitutional: He is oriented to person, place, and time. He appears well-developed and well-nourished.  morbidly obese with jaundice and asterixis  HENT:  Head: Normocephalic.  Eyes: Pupils are equal, round, and reactive to light. Conjunctivae and EOM are normal. Scleral icterus is present.  Neck: Trachea normal and normal range of motion. Neck supple.  Normal carotid pulses, no hepatojugular reflux and no JVD present. Carotid bruit is not present. No thyroid mass  present.  Cardiovascular: Normal rate and regular rhythm.  Respiratory: Effort normal.  GI: Soft. Bowel sounds are normal. He exhibits distension. He exhibits no mass. There is no abdominal tenderness. There is no rebound and no guarding.  Morbidly obese  Musculoskeletal: Normal range of motion.     Right shoulder: He exhibits no swelling.     Comments: lower limbs edema bilaterally   Neurological: He is alert and oriented to person, place, and time. He has normal strength.  Skin: Skin is warm and dry.  Psychiatric: His speech is normal. His mood appears anxious. He is agitated. He expresses inappropriate judgment. He exhibits a depressed mood. He exhibits abnormal recent memory.  Patient could not tell me what day it was but was able to say the year 2020    Assessment/Plan: 1) Alcoholic liver disease with alcoholic hepatitis, acute liver injury, severe hepatic steatosis and upper abdominal varices secondary to portal hypertension noted on on CT scan done on admission, with thrombocytopenia, transaminitis hypoalbuminemia, hyponatremia and coagulopathy and asterixis and anasarca-Meld score was 30 yesterday and 29 today with a discriminant function of 53.7. Plans are to start steroids as discussed with the pharmacy. Pharmacy will dose the medication for [place] and we will follow the patient's response closely. A Lille score might be beneficial within the next week to see if he is responding to the steroids. On Thiamine and Lactulose. Will increase the Lasix to 40 mg  BID. 2) Acute alcohol withdrawal-it is my suspicion that the patient is starting to withdraw and CIWA protocol needs to be implemented. 3) ? Proctitis-thickening of the rectal wall noted on CT scan. This can be worked up once the patient's acute symptoms improve. 4) History of ?GERD. 5) HTN/Morbid obesity. 6) Severe depression. 7) DNR. Cassandre Oleksy 03/08/2019, 9:34 AM

## 2019-03-08 NOTE — Progress Notes (Signed)
CRITICAL VALUE ALERT  Critical Value:  2.4 Lactic acid  Date & Time Notied:  03/08/19 1108  Provider Notified: Yes Dr Blake Divine  Orders Received/Actions taken: Yes

## 2019-03-08 NOTE — Progress Notes (Signed)
PROGRESS NOTE    Nicholas Price  VQQ:[id number] DOB: Sep 08, 1971 DOA: 03/07/2019 PCP: Lorre Munroe, NP    Brief Narrative:  48 year old gentleman with prior history of hypertension alcohol abuse depression, GERD, liver cirrhosis secondary to alcohol abuse, paroxysmal atrial fibrillation, anxiety presents today for generalized weakness and some shortness of breath.  Assessment & Plan:   Active Problems:   Hypertension   Alcohol abuse   Alcohol withdrawal (HCC)   GERD (gastroesophageal reflux disease)   Hepatic failure (HCC)   Hyponatremia   Alcoholic cirrhosis of liver with ascites (HCC)   Elevated lipase   Elevated INR   Thrombocytopenia (HCC)   Anasarca   Acute hepatic encephalopathy   Melena  Acute hepatic failure secondary to alcohol abuse Meld score 30 estimated 47-month mortality about 52% GI consulted for liver failure. CT abdomen and pelvis showed Changes of cirrhosis of the liver with marked hepatic steatosis and evidence of portal venous hypertension with associated splenomegaly and upper abdominal varices. Moderate amount of ascites.  Concentric and eccentric rectal wall thickening, measuring 11 mm in maximum thickness distally. This could be due to proctitis. A neoplasm is less likely but not excluded. Patient was started on low-sodium diet at this time his ultrasound abdomen could not find any ascites.   Hypertension Well-controlled continue with Lasix and metoprolol.   History of severe alcohol abuse Watch for signs of alcohol withdrawal. Patient is on CIWA protocol.   GERD Stable   Mild thrombocytopenia No signs of bleeding continue to monitor.   Acute hepatic encephalopathy Started the patient on lactulose, trend ammonia levels.   History of melena Stool for occult blood is negative. Follow hemoglobin.   Hypokalemia Replaced   Elevated liver function tests Secondary to liver failure from alcohol abuse.   Abated lactic acid  probably from alcohol intoxication Trend lactic acid.        DVT prophylaxis: scd's Code Status: DNR Family Communication: none at bedside.  Disposition Plan: pending clinical improvement.    Consultants:   Gastroenterology Dr. Loreta Ave  Procedures: None.   Antimicrobials: none.   Subjective: Some nausea, and abdominal pain .  No chest pain or sob.   Objective: Vitals:   03/07/19 2316 03/08/19 0100 03/08/19 0300 03/08/19 0441  BP: 127/87   111/86  Pulse: (!) 114 (!) 110 74 72  Resp: (!) 22   (!) 25  Temp: 99.2 F (37.3 C)   97.8 F (36.6 C)  TempSrc: Oral   Oral  SpO2: 93%   93%  Weight: (!) 142.7 kg     Height: 6\' 2"  (1.88 m)       Intake/Output Summary (Last 24 hours) at 03/08/2019 1008 Last data filed at 03/08/2019 0600 Gross per 24 hour  Intake 537.83 ml  Output -  Net 537.83 ml   Filed Weights   03/07/19 1544 03/07/19 2316  Weight: (!) 147.4 kg (!) 142.7 kg    Examination:  General exam: Appears calm and comfortable  Respiratory system: diminished at bases.  Cardiovascular system: S1 & S2 heard, RRR. No JVD,  Gastrointestinal system: Abdomen is soft, tender generalized, more in the right upper quadrant.  Central nervous system: Alert and oriented. No focal neurological deficits. Extremities: Symmetric 5 x 5 power. Skin: No rashes, lesions or ulcers Psychiatry:  Mood & affect appropriate.     Data Reviewed: I have personally reviewed following labs and imaging studies  CBC: Recent Labs  Lab 03/07/19 1543 03/08/19 0551  WBC 5.0 3.9*  NEUTROABS  3.5  --   HGB 17.3* 16.4  HCT 48.8 47.9  MCV 99.0 101.9*  PLT 73* 79*   Basic Metabolic Panel: Recent Labs  Lab 03/07/19 1543 03/07/19 2124 03/08/19 0551  NA 131*  --  134*  K 3.4*  --  3.6  CL 89*  --  92*  CO2 29  --  29  GLUCOSE 103*  --  112*  BUN 13  --  14  CREATININE 0.49*  --  0.54*  CALCIUM 8.8*  --  8.5*  MG  --  2.1 2.3  PHOS  --  2.9 2.7   GFR: Estimated Creatinine  Clearance: 173.6 mL/min (A) (by C-G formula based on SCr of 0.54 mg/dL (L)). Liver Function Tests: Recent Labs  Lab 03/07/19 1543 03/08/19 0551  AST 328* 284*  ALT 112* 99*  ALKPHOS 116 95  BILITOT 25.4* 21.8*  PROT 6.7 5.9*  ALBUMIN 2.5* 2.1*   Recent Labs  Lab 03/07/19 1543  LIPASE 165*   Recent Labs  Lab 03/07/19 1543 03/08/19 0551  AMMONIA 90* 75*   Coagulation Profile: Recent Labs  Lab 03/07/19 1537 03/08/19 0551  INR 2.3* 2.5*   Cardiac Enzymes: No results for input(s): CKTOTAL, CKMB, CKMBINDEX, TROPONINI in the last 168 hours. BNP (last 3 results) No results for input(s): PROBNP in the last 8760 hours. HbA1C: Recent Labs    03/08/19 0551  HGBA1C 4.8   CBG: No results for input(s): GLUCAP in the last 168 hours. Lipid Profile: No results for input(s): CHOL, HDL, LDLCALC, TRIG, CHOLHDL, LDLDIRECT in the last 72 hours. Thyroid Function Tests: Recent Labs    03/08/19 0551  TSH 1.095   Anemia Panel: No results for input(s): VITAMINB12, FOLATE, FERRITIN, TIBC, IRON, RETICCTPCT in the last 72 hours. Sepsis Labs: Recent Labs  Lab 03/07/19 1601 03/07/19 2351 03/08/19 0551  LATICACIDVEN 2.2* 2.7* 2.3*    No results found for this or any previous visit (from the past 240 hour(s)).       Radiology Studies: Dg Chest 2 View  Result Date: 03/07/2019 CLINICAL DATA:  Shortness of breath for 2 weeks. EXAM: CHEST - 2 VIEW COMPARISON:  Chest radiograph 04/28/2018 FINDINGS: Monitoring leads overlie the patient. Stable cardiomegaly. Low lung volumes. Heterogeneous opacities right lower lung. No pleural effusion or pneumothorax. Thoracic spine degenerative changes. IMPRESSION: Heterogeneous opacities right lower lung may represent atelectasis or infection. Electronically Signed   By: Annia Belt M.D.   On: 03/07/2019 17:23   Ct Abdomen Pelvis W Contrast  Result Date: 03/07/2019 CLINICAL DATA:  Abdominal distention and bilateral leg edema. Lethargy. Weakness.  Alcohol abuse. Ex-smoker. Inability to establish patency of the portal vein at ultrasound earlier today due to difficulty penetrating the liver. EXAM: CT ABDOMEN AND PELVIS WITH CONTRAST TECHNIQUE: Multidetector CT imaging of the abdomen and pelvis was performed using the standard protocol following bolus administration of intravenous contrast. CONTRAST:  ISOVUE-300 IOPAMIDOL (ISOVUE-300) INJECTION 61% COMPARISON:  Right upper quadrant abdomen ultrasound obtained earlier today. FINDINGS: Lower chest: Mild bibasilar atelectasis/scarring. Normal sized heart. Hepatobiliary: Marked diffuse low density of the liver relative to the spleen. The caudate lobe of the liver is significantly enlarged. Probable sludge in the gallbladder. The gallbladder is mildly dilated borderline wall thickening. No pericholecystic fluid or stranding. Pancreas: Unremarkable. No pancreatic ductal dilatation or surrounding inflammatory changes. Spleen: Mildly enlarged. Adrenals/Urinary Tract: Adrenal glands are unremarkable. Kidneys are normal, without renal calculi, focal lesion, or hydronephrosis. Bladder is unremarkable. Stomach/Bowel: Stomach is within normal limits.  Appendix appears normal. Concentric and eccentric rectal wall thickening, measuring 11 mm in maximum thickness distally. Otherwise, unremarkable colon. Normal appearing small bowel. Vascular/Lymphatic: Upper abdominal varices. Patent portal vein. No vascular occlusions are seen. No significant arterial abnormalities. No enlarged abdominal or pelvic lymph nodes. Reproductive: Prostate is unremarkable. Other: Moderate amount of free peritoneal fluid. Small umbilical and paraumbilical hernias containing fat. Musculoskeletal: Mild lower thoracic spine degenerative changes. IMPRESSION: 1. Changes of cirrhosis of the liver with marked hepatic steatosis and evidence of portal venous hypertension with associated splenomegaly and upper abdominal varices. 2. Moderate amount of  ascites. 3. Concentric and eccentric rectal wall thickening, measuring 11 mm in maximum thickness distally. This could be due to proctitis. A neoplasm is less likely but not excluded. 4. Mildly dilated gallbladder with probable sludge and borderline diffuse wall thickening. The wall thickening may be due to hypoproteinemia. Electronically Signed   By: Beckie Salts M.D.   On: 03/07/2019 20:50   US Abdomen Limited Ruq  Result Date: 03/07/2019 CLINICAL DATA:  Right upper quadrant pain for 2 weeks. Elevated LFTs EXAM: ULTRASOUND ABDOMEN LIMITED RIGHT UPPER QUADRANT COMPARISON:  None. FINDINGS: Gallbladder: No visible stones. Gallbladder wall slightly thickened at 4 mm. Negative sonographic Murphy's. Common bile duct: Diameter: Normal caliber, 6 mm Liver: Diffusely increased echotexture compatible with fatty infiltration. No focal hepatic abnormality. Cannot visualized or confirm blood flow in the main portal vein. IMPRESSION: Severe diffuse fatty infiltration of the liver. Cannot visualized or confirm portal venous blood flow. Consider further evaluation with CT with IV contrast to exclude portal venous thrombosis. Slight gallbladder wall thickening without visible stones or sonographic Murphy sign. This likely is related to liver disease. Electronically Signed   By: Charlett Nose M.D.   On: 03/07/2019 19:11        Scheduled Meds: . escitalopram  20 mg Oral Daily  . folic acid  1 mg Oral Daily  . furosemide  20 mg Oral BID  . lactulose  20 g Oral BID  . metoprolol tartrate  100 mg Oral BID  . multivitamin with minerals  1 tablet Oral Daily  . sodium chloride flush  3 mL Intravenous Q12H  . thiamine  100 mg Oral Daily   Or  . thiamine  100 mg Intravenous Daily   Continuous Infusions: . sodium chloride       LOS: 1 day    Time spent: 32 minutes    Kathlen Mody, MD [facility] Pager [phone]  If 7PM-7AM, please contact night-coverage [email or web address] Password [email or web address] 03/08/2019,  10:08 AM

## 2019-03-08 NOTE — Evaluation (Signed)
Physical Therapy Evaluation Patient Details Name: Nicholas Price MRN: [id number] DOB: Mar 03, 1971 Today's Date: 03/08/2019   History of Present Illness  48 year old gentleman with prior history of hypertension alcohol abuse depression, GERD, liver cirrhosis secondary to alcohol abuse, paroxysmal atrial fibrillation, anxiety presents today for generalized weakness and some shortness of breath. Dx of hepatic failure, EtOH withdrawal.   Clinical Impression  Pt admitted with above diagnosis. Pt currently with functional limitations due to the deficits listed below (see PT Problem List). Pt reported dizziness with supine to sit, then increased dizziness in standing. BP supine 106/75, sit 106/68, stand 99/71. Did not attempt ambulation 2* dizziness. Pt reports he's independent with mobility at baseline but has had 1 week of increasing SOB with activity. Will assess ambulation next visit.  Pt will benefit from skilled PT to increase their independence and safety with mobility to allow discharge to the venue listed below.       Follow Up Recommendations Home health PT    Equipment Recommendations  Other (comment)(to be determined, depending on progress)    Recommendations for Other Services       Precautions / Restrictions Precautions Precautions: Other (comment);Fall Precaution Comments: dizzy in sitting and standing; denies h/o falls Restrictions Weight Bearing Restrictions: No      Mobility  Bed Mobility Overal bed mobility: Modified Independent             General bed mobility comments: used bedrail  Transfers Overall transfer level: Needs assistance Equipment used: Rolling walker (2 wheeled) Transfers: Sit to/from Stand Sit to Stand: Min guard         General transfer comment: min/guard for safety 2* dizziness in sitting and standing  Ambulation/Gait             General Gait Details: deferred 2* dizziness  Stairs            Wheelchair Mobility     Modified Rankin (Stroke Patients Only)       Balance Overall balance assessment: Needs assistance   Sitting balance-Leahy Scale: Good       Standing balance-Leahy Scale: Fair                               Pertinent Vitals/Pain Pain Assessment: No/denies pain    Home Living Family/patient expects to be discharged to:: Private residence Living Arrangements: Parent Available Help at Discharge: Family;Available 24 hours/day Type of Home: Apartment Home Access: Stairs to enter Entrance Stairs-Rails: Right Entrance Stairs-Number of Steps: 14 Home Layout: One level Home Equipment: None      Prior Function Level of Independence: Independent               Hand Dominance        Extremity/Trunk Assessment   Upper Extremity Assessment Upper Extremity Assessment: Defer to OT evaluation    Lower Extremity Assessment Lower Extremity Assessment: Overall WFL for tasks assessed    Cervical / Trunk Assessment Cervical / Trunk Assessment: Normal  Communication   Communication: No difficulties  Cognition Arousal/Alertness: Awake/alert Behavior During Therapy: WFL for tasks assessed/performed Overall Cognitive Status: Within Functional Limits for tasks assessed                                        General Comments      Exercises     Assessment/Plan  PT Assessment Patient needs continued PT services  PT Problem List Decreased activity tolerance;Decreased mobility       PT Treatment Interventions Gait training;DME instruction;Stair training;Functional mobility training;Therapeutic exercise;Therapeutic activities;Patient/family education    PT Goals (Current goals can be found in the Care Plan section)  Acute Rehab PT Goals Patient Stated Goal: to feel steady with walking PT Goal Formulation: With patient Time For Goal Achievement: 03/15/19 Potential to Achieve Goals: Good    Frequency Min 3X/week   Barriers to  discharge        Co-evaluation               AM-PAC PT "6 Clicks" Mobility  Outcome Measure Help needed turning from your back to your side while in a flat bed without using bedrails?: None Help needed moving from lying on your back to sitting on the side of a flat bed without using bedrails?: A Little Help needed moving to and from a bed to a chair (including a wheelchair)?: A Little Help needed standing up from a chair using your arms (e.g., wheelchair or bedside chair)?: A Little Help needed to walk in hospital room?: A Lot Help needed climbing 3-5 steps with a railing? : A Lot 6 Click Score: 17    End of Session   Activity Tolerance: Treatment limited secondary to medical complications (Comment)(dizziness) Patient left: in bed;with call bell/phone within reach Nurse Communication: Mobility status;Other (comment)(pt dizzy in sit and stand/ pt requesting meds for tremor) PT Visit Diagnosis: Difficulty in walking, not elsewhere classified (R26.2)    Time: 2549-8264 PT Time Calculation (min) (ACUTE ONLY): 14 min   Charges:   PT Evaluation $PT Eval Low Complexity: 1 Low          Ralene Bathe Kistler PT 03/08/2019  Acute Rehabilitation Services Pager [phone] Office [phone]

## 2019-03-08 NOTE — Progress Notes (Signed)
Text paged attending B. Kyere, to verify there are no new ordes at this time for notification of  critical  lactic acid 2.7. attending confirmed.

## 2019-03-08 NOTE — Progress Notes (Signed)
Pt arrived to unit via stretcher room 1519. Alert and oriented x4. Steady gait to bed. VS taken. Pt oriented to room and callbell with no complications. Pt guide at the bedside.. 0/10 pain. Initial assessment completed. Will continue to monitor

## 2019-03-08 NOTE — Progress Notes (Signed)
IR requested for possible image-guided paracentesis.  Limited abdominal ultrasound 03/07/2019 revealed no fluid that could be safely accessed with procedure today. Because of this, procedure will not occur today. Dr. Blake Divine made aware.  IR available in future if needed.  Waylan Boga Louk, PA-C 03/08/2019, 8:48 AM

## 2019-03-09 ENCOUNTER — Inpatient Hospital Stay (HOSPITAL_COMMUNITY): Payer: Medicaid Other

## 2019-03-09 LAB — CBC WITH DIFFERENTIAL/PLATELET
Abs Immature Granulocytes: 0.11 10*3/uL — ABNORMAL HIGH (ref 0.00–0.07)
Basophils Absolute: 0 10*3/uL (ref 0.0–0.1)
Basophils Relative: 0 %
Eosinophils Absolute: 0 10*3/uL (ref 0.0–0.5)
Eosinophils Relative: 0 %
HCT: 51.4 % (ref 39.0–52.0)
Hemoglobin: 17 g/dL (ref 13.0–17.0)
Immature Granulocytes: 2 %
Lymphocytes Relative: 9 %
Lymphs Abs: 0.5 10*3/uL — ABNORMAL LOW (ref 0.7–4.0)
MCH: 34.5 pg — ABNORMAL HIGH (ref 26.0–34.0)
MCHC: 33.1 g/dL (ref 30.0–36.0)
MCV: 104.3 fL — ABNORMAL HIGH (ref 80.0–100.0)
MONOS PCT: 12 %
Monocytes Absolute: 0.7 10*3/uL (ref 0.1–1.0)
NEUTROS PCT: 77 %
NRBC: 0.4 % — AB (ref 0.0–0.2)
Neutro Abs: 4.2 10*3/uL (ref 1.7–7.7)
Platelets: 84 10*3/uL — ABNORMAL LOW (ref 150–400)
RBC: 4.93 MIL/uL (ref 4.22–5.81)
RDW: 20 % — ABNORMAL HIGH (ref 11.5–15.5)
WBC: 5.5 10*3/uL (ref 4.0–10.5)

## 2019-03-09 LAB — COMPREHENSIVE METABOLIC PANEL
ALT: 99 U/L — ABNORMAL HIGH (ref 0–44)
AST: 263 U/L — ABNORMAL HIGH (ref 15–41)
Albumin: 2.1 g/dL — ABNORMAL LOW (ref 3.5–5.0)
Alkaline Phosphatase: 99 U/L (ref 38–126)
Anion gap: 12 (ref 5–15)
BUN: 18 mg/dL (ref 6–20)
CO2: 30 mmol/L (ref 22–32)
Calcium: 8.7 mg/dL — ABNORMAL LOW (ref 8.9–10.3)
Chloride: 94 mmol/L — ABNORMAL LOW (ref 98–111)
Creatinine, Ser: 0.42 mg/dL — ABNORMAL LOW (ref 0.61–1.24)
Glucose, Bld: 108 mg/dL — ABNORMAL HIGH (ref 70–99)
Potassium: 3.2 mmol/L — ABNORMAL LOW (ref 3.5–5.1)
Sodium: 136 mmol/L (ref 135–145)
Total Bilirubin: 22.9 mg/dL (ref 0.3–1.2)
Total Protein: 6.3 g/dL — ABNORMAL LOW (ref 6.5–8.1)

## 2019-03-09 LAB — HEPATITIS PANEL, ACUTE
HCV Ab: 0.2 s/co ratio (ref 0.0–0.9)
Hep A IgM: NEGATIVE
Hep B C IgM: NEGATIVE
Hepatitis B Surface Ag: NEGATIVE

## 2019-03-09 LAB — FERRITIN: Ferritin: >7500 ng/mL — ABNORMAL HIGH (ref 24–336)

## 2019-03-09 LAB — URINE CULTURE: Culture: <10000 — AB

## 2019-03-09 LAB — IRON AND TIBC
Iron: 84 ug/dL (ref 45–182)
TIBC: <70 ug/dL — ABNORMAL LOW (ref 250–450)

## 2019-03-09 MED ORDER — PANTOPRAZOLE SODIUM 40 MG PO TBEC
40.0000 mg | DELAYED_RELEASE_TABLET | Freq: Every day | ORAL | Status: DC
  Administered 2019-03-09 – 2019-03-16 (×8): 40 mg via ORAL
  Filled 2019-03-09 (×9): qty 1

## 2019-03-09 MED ORDER — POTASSIUM CHLORIDE CRYS ER 20 MEQ PO TBCR
40.0000 meq | EXTENDED_RELEASE_TABLET | Freq: Once | ORAL | Status: AC
  Administered 2019-03-09: 40 meq via ORAL
  Filled 2019-03-09: qty 2

## 2019-03-09 MED ORDER — RIFAXIMIN 550 MG PO TABS
550.0000 mg | ORAL_TABLET | Freq: Two times a day (BID) | ORAL | Status: DC
  Administered 2019-03-09 – 2019-03-12 (×7): 550 mg via ORAL
  Filled 2019-03-09 (×7): qty 1

## 2019-03-09 NOTE — Progress Notes (Signed)
PROGRESS NOTE    Nicholas Price  OZH:[id number] DOB: [birth year] DOA: 03/07/2019 PCP: Lorre Munroe, NP    Brief Narrative:  48 year old gentleman with prior history of hypertension alcohol abuse depression, GERD, liver cirrhosis secondary to alcohol abuse, paroxysmal atrial fibrillation, anxiety presents today for generalized weakness and some shortness of breath.  Assessment & Plan:   Active Problems:   Hypertension   Alcohol abuse   Alcohol withdrawal (HCC)   GERD (gastroesophageal reflux disease)   Hepatic failure (HCC)   Hyponatremia   Alcoholic cirrhosis of liver with ascites (HCC)   Elevated lipase   Elevated INR   Thrombocytopenia (HCC)   Anasarca   Acute hepatic encephalopathy   Melena  Acute hepatic failure secondary to alcohol abuse Meld score 30 estimated 89-month mortality about 52% GI consulted for liver failure. CT abdomen and pelvis showed Changes of cirrhosis of the liver with marked hepatic steatosis and evidence of portal venous hypertension with associated splenomegaly and upper abdominal varices. Moderate amount of ascites.  Concentric and eccentric rectal wall thickening, measuring 11 mm in maximum thickness distally. This could be due to proctitis. A neoplasm is less likely but not excluded. Patient was started on low-sodium diet at this time his ultrasound abdomen could not find any ascites. Mild ascitex on exam, not enough to be aspirated.  He was started on steroids for fulminant liver failure.  Appreciate Gi recommendations.   Hypertension Well-controlled continue with Lasix and metoprolol.   History of severe alcohol abuse Watch for signs of alcohol withdrawal. Patient is on CIWA protocol.   GERD Stable   Mild thrombocytopenia No signs of bleeding continue to monitor.   Acute hepatic encephalopathy Started the patient on lactulose, trend ammonia levels. Added Xifaxan .    Hypokalemia: replaced.    Hyperbilirubinemia:  From  liver failure.   History of melena Stool for occult blood is negative. Follow hemoglobin. He will need EGD eventually for evaluation of varices.  Pt currently denies any abd pain, hematochezia or hematemesis.    Hypokalemia Replaced   Elevated liver function tests Secondary to liver failure from alcohol abuse.   Abated lactic acid probably from alcohol intoxication Trend lactic acid.        DVT prophylaxis: scd's Code Status: DNR Family Communication: none at bedside.  Disposition Plan: pending clinical improvement.    Consultants:   Gastroenterology Dr. Loreta Ave  Procedures: None.   Antimicrobials: none.   Subjective: No Nausea, vomiting, some  abdominal pain.    Objective: Vitals:   03/08/19 2039 03/09/19 0658 03/09/19 1404 03/09/19 1411  BP: 112/78 123/90 125/88   Pulse: 71 79 93   Resp: 16  20   Temp: 98 F (36.7 C) 97.8 F (36.6 C) 98 F (36.7 C)   TempSrc: Oral Oral Oral   SpO2: 94% (!) 89% (!) 89% 92%  Weight:      Height:        Intake/Output Summary (Last 24 hours) at 03/09/2019 1431 Last data filed at 03/09/2019 1002 Gross per 24 hour  Intake 3 ml  Output --  Net 3 ml   Filed Weights   03/07/19 1544 03/07/19 2316  Weight: (!) 147.4 kg (!) 142.7 kg    Examination:  General exam: Appears calm and comfortable  Respiratory system: diminished at bases.  Cardiovascular system: S1 & S2 heard, RRR. No JVD,  Gastrointestinal system: Abdomen is soft, tender generalized, more in the right upper quadrant. Distended. Bowel sounds good.  Central nervous system: Alert  and oriented. No focal neurological deficits. Extremities: Symmetric 5 x 5 power. Skin: No rashes, lesions or ulcers Psychiatry:  Mood & affect appropriate.     Data Reviewed: I have personally reviewed following labs and imaging studies  CBC: Recent Labs  Lab 03/07/19 1543 03/08/19 0551 03/09/19 1022  WBC 5.0 3.9* 5.5  NEUTROABS 3.5  --  4.2  HGB 17.3* 16.4 17.0  HCT  48.8 47.9 51.4  MCV 99.0 101.9* 104.3*  PLT 73* 79* 84*   Basic Metabolic Panel: Recent Labs  Lab 03/07/19 1543 03/07/19 2124 03/08/19 0551 03/09/19 0533  NA 131*  --  134* 136  K 3.4*  --  3.6 3.2*  CL 89*  --  92* 94*  CO2 29  --  29 30  GLUCOSE 103*  --  112* 108*  BUN 13  --  14 18  CREATININE 0.49*  --  0.54* 0.42*  CALCIUM 8.8*  --  8.5* 8.7*  MG  --  2.1 2.3  --   PHOS  --  2.9 2.7  --    GFR: Estimated Creatinine Clearance: 173.6 mL/min (A) (by C-G formula based on SCr of 0.42 mg/dL (L)). Liver Function Tests: Recent Labs  Lab 03/07/19 1543 03/08/19 0551 03/09/19 0533  AST 328* 284* 263*  ALT 112* 99* 99*  ALKPHOS 116 95 99  BILITOT 25.4* 21.8* 22.9*  PROT 6.7 5.9* 6.3*  ALBUMIN 2.5* 2.1* 2.1*   Recent Labs  Lab 03/07/19 1543 03/08/19 1108  LIPASE 165* 158*   Recent Labs  Lab 03/07/19 1543 03/08/19 0551  AMMONIA 90* 75*   Coagulation Profile: Recent Labs  Lab 03/07/19 1537 03/08/19 0551  INR 2.3* 2.5*   Cardiac Enzymes: No results for input(s): CKTOTAL, CKMB, CKMBINDEX, TROPONINI in the last 168 hours. BNP (last 3 results) No results for input(s): PROBNP in the last 8760 hours. HbA1C: Recent Labs    03/08/19 0551  HGBA1C 4.8   CBG: No results for input(s): GLUCAP in the last 168 hours. Lipid Profile: No results for input(s): CHOL, HDL, LDLCALC, TRIG, CHOLHDL, LDLDIRECT in the last 72 hours. Thyroid Function Tests: Recent Labs    03/08/19 0551  TSH 1.095   Anemia Panel: Recent Labs    03/09/19 0533  FERRITIN >7,500*  TIBC <70*  IRON 84   Sepsis Labs: Recent Labs  Lab 03/07/19 1601 03/07/19 2351 03/08/19 0551 03/08/19 1108  LATICACIDVEN 2.2* 2.7* 2.3* 2.4*    Recent Results (from the past 240 hour(s))  Urine culture     Status: Abnormal   Collection Time: 03/07/19  4:01 PM  Result Value Ref Range Status   Specimen Description   Final    URINE, RANDOM Performed at [facility], [address] [place], [place] [address]    Special Requests   Final    NONE Performed at [facility], [address] [place], [place] [address]    Culture (A)  Final    <10,000 COLONIES/mL INSIGNIFICANT GROWTH Performed at [facility], [address] [place], [place] [address]    Report Status 03/09/2019 FINAL  Final  Blood culture (routine x 2)     Status: None (Preliminary result)   Collection Time: 03/07/19  4:01 PM  Result Value Ref Range Status   Specimen Description   Final    BLOOD RIGHT HAND Performed at [facility], [address] [place], [place] [address]    Special Requests   Final  BOTTLES DRAWN AEROBIC AND ANAEROBIC Blood Culture adequate volume Performed at [facility], [address] [place], [place] [address]    Culture   Final    NO GROWTH 2 DAYS Performed at [facility], [address], [place], [place] [address]    Report Status PENDING  Incomplete  Blood culture (routine x 2)     Status: None (Preliminary result)   Collection Time: 03/07/19  4:06 PM  Result Value Ref Range Status   Specimen Description   Final    BLOOD LEFT HAND Performed at [facility], [address], [place], [place] [address]    Special Requests   Final    BOTTLES DRAWN AEROBIC AND ANAEROBIC Blood Culture adequate volume Performed at [facility], [address], [place], [place] [address]    Culture   Final    NO GROWTH 2 DAYS Performed at [facility], [address] [place], [place] [address]    Report Status PENDING  Incomplete         Radiology Studies: Dg Chest 2 View  Result Date: 03/07/2019 CLINICAL DATA:  Shortness of breath for 2 weeks. EXAM: CHEST - 2 VIEW COMPARISON:  Chest radiograph 04/28/2018 FINDINGS: Monitoring leads overlie the patient. Stable cardiomegaly. Low lung volumes. Heterogeneous opacities right lower lung. No pleural effusion or  pneumothorax. Thoracic spine degenerative changes. IMPRESSION: Heterogeneous opacities right lower lung may represent atelectasis or infection. Electronically Signed   By: Annia Belt M.D.   On: 03/07/2019 17:23   Ct Abdomen Pelvis W Contrast  Result Date: 03/07/2019 CLINICAL DATA:  Abdominal distention and bilateral leg edema. Lethargy. Weakness. Alcohol abuse. Ex-smoker. Inability to establish patency of the portal vein at ultrasound earlier today due to difficulty penetrating the liver. EXAM: CT ABDOMEN AND PELVIS WITH CONTRAST TECHNIQUE: Multidetector CT imaging of the abdomen and pelvis was performed using the standard protocol following bolus administration of intravenous contrast. CONTRAST:  ISOVUE-300 IOPAMIDOL (ISOVUE-300) INJECTION 61% COMPARISON:  Right upper quadrant abdomen ultrasound obtained earlier today. FINDINGS: Lower chest: Mild bibasilar atelectasis/scarring. Normal sized heart. Hepatobiliary: Marked diffuse low density of the liver relative to the spleen. The caudate lobe of the liver is significantly enlarged. Probable sludge in the gallbladder. The gallbladder is mildly dilated borderline wall thickening. No pericholecystic fluid or stranding. Pancreas: Unremarkable. No pancreatic ductal dilatation or surrounding inflammatory changes. Spleen: Mildly enlarged. Adrenals/Urinary Tract: Adrenal glands are unremarkable. Kidneys are normal, without renal calculi, focal lesion, or hydronephrosis. Bladder is unremarkable. Stomach/Bowel: Stomach is within normal limits. Appendix appears normal. Concentric and eccentric rectal wall thickening, measuring 11 mm in maximum thickness distally. Otherwise, unremarkable colon. Normal appearing small bowel. Vascular/Lymphatic: Upper abdominal varices. Patent portal vein. No vascular occlusions are seen. No significant arterial abnormalities. No enlarged abdominal or pelvic lymph nodes. Reproductive: Prostate is unremarkable. Other: Moderate amount of  free peritoneal fluid. Small umbilical and paraumbilical hernias containing fat. Musculoskeletal: Mild lower thoracic spine degenerative changes. IMPRESSION: 1. Changes of cirrhosis of the liver with marked hepatic steatosis and evidence of portal venous hypertension with associated splenomegaly and upper abdominal varices. 2. Moderate amount of ascites. 3. Concentric and eccentric rectal wall thickening, measuring 11 mm in maximum thickness distally. This could be due to proctitis. A neoplasm is less likely but not excluded. 4. Mildly dilated gallbladder with probable sludge and borderline diffuse wall thickening. The wall thickening may be due to hypoproteinemia. Electronically Signed   By: Beckie Salts  M.D.   On: 03/07/2019 20:50   US Abdomen Limited Ruq  Result Date: 03/07/2019 CLINICAL DATA:  Right upper quadrant pain for 2 weeks. Elevated LFTs EXAM: ULTRASOUND ABDOMEN LIMITED RIGHT UPPER QUADRANT COMPARISON:  None. FINDINGS: Gallbladder: No visible stones. Gallbladder wall slightly thickened at 4 mm. Negative sonographic Murphy's. Common bile duct: Diameter: Normal caliber, 6 mm Liver: Diffusely increased echotexture compatible with fatty infiltration. No focal hepatic abnormality. Cannot visualized or confirm blood flow in the main portal vein. IMPRESSION: Severe diffuse fatty infiltration of the liver. Cannot visualized or confirm portal venous blood flow. Consider further evaluation with CT with IV contrast to exclude portal venous thrombosis. Slight gallbladder wall thickening without visible stones or sonographic Murphy sign. This likely is related to liver disease. Electronically Signed   By: Charlett Nose M.D.   On: 03/07/2019 19:11        Scheduled Meds:  escitalopram  20 mg Oral Daily   folic acid  1 mg Oral Daily   furosemide  20 mg Intravenous Q12H   lactulose  20 g Oral BID   multivitamin with minerals  1 tablet Oral Daily   pantoprazole  40 mg Oral Daily   prednisoLONE  40  mg Oral Daily   rifaximin  550 mg Oral BID   sodium chloride flush  3 mL Intravenous Q12H   thiamine  100 mg Oral Daily   Or   thiamine  100 mg Intravenous Daily   Continuous Infusions:  sodium chloride       LOS: 2 days    Time spent: 32 minutes    Kathlen Mody, MD [facility] Pager [phone]  If 7PM-7AM, please contact night-coverage [email or web address] Password [facility] 03/09/2019, 2:31 PM

## 2019-03-09 NOTE — Progress Notes (Addendum)
[facility] Progress Note  CC:  EtOH cirrhosis   Subjective: He slept well last night. No N/V or abdominal pain. Last black stool was yesterday. He passed a golden colored loose stool this am.   Objective:  Vital signs in last 24 hours: Temp:  [97.8 F (36.6 C)-98.5 F (36.9 C)] 97.8 F (36.6 C) (03/16 0658) Pulse Rate:  [67-79] 79 (03/16 0658) Resp:  [16] 16 (03/15 2039) BP: (83-123)/(62-90) 123/90 (03/16 0658) SpO2:  [89 %-94 %] 89 % (03/16 0658) Last BM Date: 03/08/19 General:   Alert, sitting up at bedside in NAD. Face flushed with underlying jaundice. Skin: Jaundice Eyes: PERRLA. Sclera icteric  Heart:  Regular rate and rhythm; no murmurs Pulm: Lungs clear throughout Abdomen: Soft, distended, + ascites, abdomen is not tense, + hepatomegaly, nontender, + BS x 4 quads. Extremities:  2+ edema bilaterally. Neurologic:  Alert and  oriented x4; + Asterixis.  Psych:  Alert and cooperative. Normal mood and affect.  Intake/Output from previous day: No intake/output data recorded. Intake/Output this shift: No intake/output data recorded.  Lab Results: Recent Labs    03/07/19 1543 03/08/19 0551  WBC 5.0 3.9*  HGB 17.3* 16.4  HCT 48.8 47.9  PLT 73* 79*   BMET Recent Labs    03/07/19 1543 03/08/19 0551 03/09/19 0533  NA 131* 134* 136  K 3.4* 3.6 3.2*  CL 89* 92* 94*  CO2 _0 GLUCOSE 103* 112* 108*  BUN _1 CREATININE 0.49* 0.54* 0.42*  CALCIUM 8.8* 8.5* 8.7*   LFT Recent Labs    03/09/19 0533  PROT 6.3*  ALBUMIN 2.1*  AST 263*  ALT 99*  ALKPHOS 99  BILITOT 22.9*   PT/INR Recent Labs    03/07/19 1537 03/08/19 0551  LABPROT 24.9* 26.4*  INR 2.3* 2.5*     Dg Chest 2 View  Result Date: 03/07/2019 CLINICAL DATA:  Shortness of breath for 2 weeks. EXAM: CHEST - 2 VIEW COMPARISON:  Chest radiograph 04/28/2018 FINDINGS: Monitoring leads overlie the patient. Stable cardiomegaly. Low lung volumes. Heterogeneous opacities  right lower lung. No pleural effusion or pneumothorax. Thoracic spine degenerative changes. IMPRESSION: Heterogeneous opacities right lower lung may represent atelectasis or infection. Electronically Signed   By: Lovey Newcomer M.D.   On: 03/07/2019 17:23   Ct Abdomen Pelvis W Contrast  Result Date: 03/07/2019 CLINICAL DATA:  Abdominal distention and bilateral leg edema. Lethargy. Weakness. Alcohol abuse. Ex-smoker. Inability to establish patency of the portal vein at ultrasound earlier today due to difficulty penetrating the liver. EXAM: CT ABDOMEN AND PELVIS WITH CONTRAST TECHNIQUE: Multidetector CT imaging of the abdomen and pelvis was performed using the standard protocol following bolus administration of intravenous contrast. CONTRAST:  165m ISOVUE-300 IOPAMIDOL (ISOVUE-300) INJECTION 61% COMPARISON:  Right upper quadrant abdomen ultrasound obtained earlier today. FINDINGS: Lower chest: Mild bibasilar atelectasis/scarring. Normal sized heart. Hepatobiliary: Marked diffuse low density of the liver relative to the spleen. The caudate lobe of the liver is significantly enlarged. Probable sludge in the gallbladder. The gallbladder is mildly dilated borderline wall thickening. No pericholecystic fluid or stranding. Pancreas: Unremarkable. No pancreatic ductal dilatation or surrounding inflammatory changes. Spleen: Mildly enlarged. Adrenals/Urinary Tract: Adrenal glands are unremarkable. Kidneys are normal, without renal calculi, focal lesion, or hydronephrosis. Bladder is unremarkable. Stomach/Bowel: Stomach is within normal limits. Appendix appears normal. Concentric and eccentric rectal wall thickening, measuring 11 mm in maximum thickness distally. Otherwise, unremarkable colon. Normal appearing small bowel. Vascular/Lymphatic: Upper abdominal varices.  Patent portal vein. No vascular occlusions are seen. No significant arterial abnormalities. No enlarged abdominal or pelvic lymph nodes. Reproductive: Prostate  is unremarkable. Other: Moderate amount of free peritoneal fluid. Small umbilical and paraumbilical hernias containing fat. Musculoskeletal: Mild lower thoracic spine degenerative changes. IMPRESSION: 1. Changes of cirrhosis of the liver with marked hepatic steatosis and evidence of portal venous hypertension with associated splenomegaly and upper abdominal varices. 2. Moderate amount of ascites. 3. Concentric and eccentric rectal wall thickening, measuring 11 mm in maximum thickness distally. This could be due to proctitis. A neoplasm is less likely but not excluded. 4. Mildly dilated gallbladder with probable sludge and borderline diffuse wall thickening. The wall thickening may be due to hypoproteinemia. Electronically Signed   By: Claudie Revering M.D.   On: 03/07/2019 20:50   US Abdomen Limited Ruq  Result Date: 03/07/2019 CLINICAL DATA:  Right upper quadrant pain for 2 weeks. Elevated LFTs EXAM: ULTRASOUND ABDOMEN LIMITED RIGHT UPPER QUADRANT COMPARISON:  None. FINDINGS: Gallbladder: No visible stones. Gallbladder wall slightly thickened at 4 mm. Negative sonographic Murphy's. Common bile duct: Diameter: Normal caliber, 6 mm Liver: Diffusely increased echotexture compatible with fatty infiltration. No focal hepatic abnormality. Cannot visualized or confirm blood flow in the main portal vein. IMPRESSION: Severe diffuse fatty infiltration of the liver. Cannot visualized or confirm portal venous blood flow. Consider further evaluation with CT with IV contrast to exclude portal venous thrombosis. Slight gallbladder wall thickening without visible stones or sonographic Murphy sign. This likely is related to liver disease. Electronically Signed   By: Rolm Baptise M.D.   On: 03/07/2019 19:11    Assessment / Plan: 1. 48 y.o. male with EtOh Cirrhosis MELD Na 31 on Prednisolone 14m po QD. Alk phos 99. AST 263. ALT 99. Ammonia 75. Albumin 2.1. PLT 79. Cr 0.42. -Continue Prednisolone 485mQD -Continue Lasix 2030mV  bid -Monitor Hepatic panel daily  - Eventual EGD as outpatient to assess for EV and GV -patient is not a transplant candidate d/t ongoing EtOH abuse  -check Hep A, B, C serologies,  if not immune will require Hep A and Hep B vaccinations  2. Ascites secondary to # 1 -paracentesis, to check cell ct with diff, culture, gram stain, albumin, protein and cytology. Patient is afebrile. WBC 3.9, however, asymptomatic SBP to be ruled out.  3. Melena. Hg 16.4. HCT 47.9. No further melena, last BM golden brown. -monitor for further melena -Eventual EGD as out patient to survey for EV and GV, if active UGI bleed develops then EGD as inpatient  -Protonix 82m65m QD -check iron, TIBC and Ferritin level  4. Jaundice. T.bili 22.9 -follow hepatic panel daily   5. Coagulopathy. INR 2.5 -follow INR, consider Vitamin K 10mg31mif INR increases   6. Thrombocytopenia. PLT 79 -monitor plt count daily   7. Hepatic Encephaolpathy -agree with Lactolose 20G  Po bid -start Xifaxan 550mg 20mid   8. Hypokalemia K 3.2.  -KCL replacement per hospitalist   9. ETOH abuse  -CIWA protocol   8. Paroxysmal atrial fibrillation      LOS: 2 days   ColleeNoralyn Pick/2020, 8:22 AM    Attending physician's note   I have taken an interval history, reviewed the chart and examined the patient. I agree with the Advanced Practitioner's note, impression and recommendations.   47 yea68old with acute alcoholic hepatitis on chronic liver disease with jaundice (TB 22), ascites, coagulopathy, thrombocytopenia and hepatic encephalopathy.  Not a candidate for  liver transplant due to continued alcohol abuse. CT showing cirrhosis with upper abdominal varices, splenomegaly, ascites and ? Proctitis.  He also had dark stools which have cleared.  Hb is stable. Plan: Steroids, Lasix, lactulose, rifaximin, monitor for alcohol withdrawal, stop alcohol.  Add spironolactone in AM, paracentesis to rule out SBP, will need  EGD and colonoscopy as an outpatient.  Trend LFTs, lytes, ammonia, CBC.  He wants to be DNR.  Carmell Austria, MD

## 2019-03-09 NOTE — Progress Notes (Signed)
Patient ID: Nicholas Price, male   DOB: [birth year], 48 y.o.   MRN: [id number] Pt presented to [place] dept today for paracentesis. On limited US abd in all four quadrants there is only a small amount of ascites present and not easily accessible for aspiration due to close proximity to surrounding bowel loops. Procedure cancelled. Pt informed.

## 2019-03-09 NOTE — Progress Notes (Signed)
CRITICAL VALUE ALERT  Critical Value:  Total bili 22.9  Date & Time Notied:  [id number]  Provider Notified: yes  Orders Received/Actions taken: no

## 2019-03-10 DIAGNOSIS — Z66 Do not resuscitate: Secondary | ICD-10-CM

## 2019-03-10 DIAGNOSIS — K7011 Alcoholic hepatitis with ascites: Secondary | ICD-10-CM

## 2019-03-10 DIAGNOSIS — Z515 Encounter for palliative care: Secondary | ICD-10-CM

## 2019-03-10 LAB — COMPREHENSIVE METABOLIC PANEL
ALT: 93 U/L — ABNORMAL HIGH (ref 0–44)
AST: 237 U/L — ABNORMAL HIGH (ref 15–41)
Albumin: 2 g/dL — ABNORMAL LOW (ref 3.5–5.0)
Alkaline Phosphatase: 95 U/L (ref 38–126)
Anion gap: 11 (ref 5–15)
BUN: 18 mg/dL (ref 6–20)
CO2: 31 mmol/L (ref 22–32)
Calcium: 8.5 mg/dL — ABNORMAL LOW (ref 8.9–10.3)
Chloride: 96 mmol/L — ABNORMAL LOW (ref 98–111)
Creatinine, Ser: 0.52 mg/dL — ABNORMAL LOW (ref 0.61–1.24)
GFR calc Af Amer: >60 mL/min (ref >60)
GFR calc non Af Amer: >60 mL/min (ref >60)
Glucose, Bld: 91 mg/dL (ref 70–99)
Potassium: 3.3 mmol/L — ABNORMAL LOW (ref 3.5–5.1)
Sodium: 138 mmol/L (ref 135–145)
Total Bilirubin: 20.4 mg/dL (ref 0.3–1.2)
Total Protein: 6 g/dL — ABNORMAL LOW (ref 6.5–8.1)

## 2019-03-10 LAB — HIV ANTIBODY (ROUTINE TESTING W REFLEX): HIV Screen 4th Generation wRfx: NONREACTIVE

## 2019-03-10 MED ORDER — SPIRONOLACTONE 25 MG PO TABS
25.0000 mg | ORAL_TABLET | Freq: Every day | ORAL | Status: DC
  Administered 2019-03-10 – 2019-03-16 (×7): 25 mg via ORAL
  Filled 2019-03-10 (×7): qty 1

## 2019-03-10 MED ORDER — POTASSIUM CHLORIDE CRYS ER 20 MEQ PO TBCR
40.0000 meq | EXTENDED_RELEASE_TABLET | Freq: Two times a day (BID) | ORAL | Status: AC
  Administered 2019-03-10 (×2): 40 meq via ORAL
  Filled 2019-03-10 (×2): qty 2

## 2019-03-10 MED ORDER — LORAZEPAM 1 MG PO TABS
1.0000 mg | ORAL_TABLET | ORAL | Status: DC | PRN
  Administered 2019-03-10 – 2019-03-14 (×13): 1 mg via ORAL
  Filled 2019-03-10 (×13): qty 1

## 2019-03-10 MED ORDER — FUROSEMIDE 10 MG/ML IJ SOLN
40.0000 mg | Freq: Two times a day (BID) | INTRAMUSCULAR | Status: DC
  Administered 2019-03-10 – 2019-03-13 (×6): 40 mg via INTRAVENOUS
  Filled 2019-03-10 (×5): qty 4

## 2019-03-10 NOTE — Progress Notes (Signed)
Occupational Therapy Treatment Patient Details Name: Nicholas Price MRN: [id number] DOB: 1971/07/03 Today's Date: 03/10/2019    History of present illness 48 year old gentleman with prior history of hypertension alcohol abuse depression, GERD, liver cirrhosis secondary to alcohol abuse, paroxysmal atrial fibrillation, anxiety presents today for generalized weakness and some shortness of breath. Dx of hepatic failure, EtOH withdrawal.    OT comments  PATIENT WAS ED ON ENERGY CONSERVATION AND PERFORMING ADLS AND IADLS. PATIENT WAS ED ON USE OF AE FOR LE ADLS AND WAS ABLE TO RETURN DEMO AT MIN A. PATIENT WAS ABLE TO PERFORM TRANSFER TO COMMODE AT MOD I LEVEL. PATIENT WAS ABLE TO SIMULATE TUB TRANSFER IN BATHROOM. PATIENT TO BE FOLLOWED ACUTELY.   Follow Up Recommendations       Equipment Recommendations       Recommendations for Other Services      Precautions / Restrictions Precautions Precautions: Fall Precaution Comments: dizzy in sitting and standing; denies h/o falls Restrictions Weight Bearing Restrictions: No       Mobility Bed Mobility Overal bed mobility: Modified Independent                Transfers       Sit to Stand: Modified independent (Device/Increase time)              Balance                                           ADL either performed or assessed with clinical judgement   ADL Overall ADL's : Needs assistance/impaired             Lower Body Bathing: Supervison/ safety;With adaptive equipment;Sit to/from stand       Lower Body Dressing: Minimal assistance;With adaptive equipment;Sit to/from stand   Toilet Transfer: Modified Scientist, research (life sciences): (simulated stepping into tub. patient has 2 grab bars )   Functional mobility during ADLs: (PATIENT MOD I TO AMB IN ROOM) General ADL Comments: PATIENT WAS ED ON USE OF AE FOR LE ADLS. PATIENT WAS EDUCATED ON ENERGY CONSERVATION     Vision        Perception     Praxis      Cognition Arousal/Alertness: Awake/alert Behavior During Therapy: WFL for tasks assessed/performed Overall Cognitive Status: Within Functional Limits for tasks assessed                                          Exercises     Shoulder Instructions       General Comments      Pertinent Vitals/ Pain       Pain Assessment: No/denies pain  Home Living                                          Prior Functioning/Environment              Frequency           Progress Toward Goals  OT Goals(current goals can now be found in the care plan section)  Progress towards OT goals: Progressing toward goals  Acute Rehab OT Goals Patient Stated Goal: GO HOME SOON  Plan Discharge plan remains appropriate    Co-evaluation                 AM-PAC OT "6 Clicks" Daily Activity     Outcome Measure   Help from another person eating meals?: None Help from another person taking care of personal grooming?: None Help from another person toileting, which includes using toliet, bedpan, or urinal?: None Help from another person bathing (including washing, rinsing, drying)?: A Little Help from another person to put on and taking off regular upper body clothing?: None Help from another person to put on and taking off regular lower body clothing?: A Little 6 Click Score: 22    End of Session        Activity Tolerance Patient tolerated treatment well   Patient Left in bed;with call bell/phone within reach   Nurse Communication (OK THERAPY)        Time: 5643-3295 OT Time Calculation (min): 23 min  Charges: OT General Charges $OT Visit: 1 Visit OT Treatments $Self Care/Home Management : 23-37 mins  6 CLICKS   Luis Sami 03/10/2019, 12:52 PM

## 2019-03-10 NOTE — Progress Notes (Signed)
Physical Therapy Treatment Patient Details Name: Nicholas Price MRN: [id number] DOB: 1971-08-27 Today's Date: 03/10/2019    History of Present Illness 48 year old gentleman with prior history of hypertension alcohol abuse depression, GERD, liver cirrhosis secondary to alcohol abuse, paroxysmal atrial fibrillation, anxiety presents today for generalized weakness and some shortness of breath. Dx of hepatic failure, EtOH withdrawal.     PT Comments    Assisted OOB to amb a great distance in hallway.  General Gait Details: choppy, ataxic, unsteady gait with wide BOS and B UE's extended to off set balance.  Tolerated distance with min c/o dizziness and fatigue.   Follow Up Recommendations  Home health PT     Equipment Recommendations  (TBD)    Recommendations for Other Services       Precautions / Restrictions Precautions Precautions: Fall Precaution Comments: mild dizzy with position change  Restrictions Weight Bearing Restrictions: No    Mobility  Bed Mobility Overal bed mobility: Modified Independent             General bed mobility comments: increased time   Transfers Overall transfer level: Needs assistance Equipment used: None Transfers: Sit to/from Stand Sit to Stand: Supervision         General transfer comment: use of hands to steady self   Ambulation/Gait Ambulation/Gait assistance: Min guard;Min assist Gait Distance (Feet): 145 Feet Assistive device: None Gait Pattern/deviations: Step-through pattern;Decreased stride length Gait velocity: decreased    General Gait Details: choppy, ataxic, unsteady gait with wide BOS and B UE's extended to off set balance.  Tolerated distance with min c/o dizziness and fatigue.   Stairs             Wheelchair Mobility    Modified Rankin (Stroke Patients Only)       Balance                                            Cognition Arousal/Alertness: Awake/alert Behavior During Therapy:  WFL for tasks assessed/performed Overall Cognitive Status: Within Functional Limits for tasks assessed                                        Exercises      General Comments        Pertinent Vitals/Pain Pain Assessment: No/denies pain    Home Living                      Prior Function            PT Goals (current goals can now be found in the care plan section) Progress towards PT goals: Progressing toward goals    Frequency    Min 3X/week      PT Plan Current plan remains appropriate    Co-evaluation              AM-PAC PT "6 Clicks" Mobility   Outcome Measure  Help needed turning from your back to your side while in a flat bed without using bedrails?: A Little Help needed moving from lying on your back to sitting on the side of a flat bed without using bedrails?: A Little Help needed moving to and from a bed to a chair (including a wheelchair)?: A Little Help needed standing up from a chair  using your arms (e.g., wheelchair or bedside chair)?: A Little Help needed to walk in hospital room?: A Little Help needed climbing 3-5 steps with a railing? : A Lot 6 Click Score: 17    End of Session Equipment Utilized During Treatment: Gait belt Activity Tolerance: Patient tolerated treatment well Patient left: in bed;with call bell/phone within reach;with bed alarm set Nurse Communication: Mobility status PT Visit Diagnosis: Difficulty in walking, not elsewhere classified (R26.2)     Time: 6269-4854 PT Time Calculation (min) (ACUTE ONLY): 11 min  Charges:  $Gait Training: 8-22 mins                     {Genever Hentges  PTA Acute  Rehabilitation [facility]      [phone] Office      [phone]

## 2019-03-10 NOTE — Progress Notes (Addendum)
[facility] Progress Note  CC:  EtOH cirrhosis   Subjective: Continues to feel SOB, on oxygen 2L [place]. No cough or hemoptysis. No chest pain. Tolerating a solid diet. Bms golden colored. No melena, noted streak of red blood on TP this am. No rectal pain. Urine remains dark in color. No abdominal pain.   Objective:  Vital signs in last 24 hours: Temp:  [97.6 F (36.4 C)-98.3 F (36.8 C)] 98.1 F (36.7 C) (03/17 0617) Pulse Rate:  [86-93] 88 (03/17 0617) Resp:  [16-20] 20 (03/17 0617) BP: (116-128)/(80-95) 128/95 (03/17 0617) SpO2:  [89 %-95 %] 95 % (03/17 0617) Last BM Date: 03/09/19 General:   Alert, sitting at bed side, slightly  Tremulous when standing for orthostatic BP readings Skin: jaundice  Eyes: sclera icteric.  Heart: Tachycardic, no murmurs Pulm: clear throughout Abdomen: Obese, soft, ascites  but abdomen is not tense, nontender, + BS x 4 quads. Liver border palpated 1cm below costal margin with inspiration.    Extremities:  LEs with 2+ edema Neurologic:  Alert and  oriented x4;  grossly normal neurologically. + Asterixis.  Psych:  Alert and cooperative. Normal mood and affect.  Intake/Output from previous day: 03/16 0701 - 03/17 0700 In: 243 [P.O.:240; I.V.:3] Out: -  Intake/Output this shift: No intake/output data recorded.  Lab Results: Recent Labs    03/07/19 1543 03/08/19 0551 03/09/19 1022  WBC 5.0 3.9* 5.5  HGB 17.3* 16.4 17.0  HCT 48.8 47.9 51.4  PLT 73* 79* 84*   BMET Recent Labs    03/08/19 0551 03/09/19 0533 03/10/19 0549  NA 134* 136 138  K 3.6 3.2* 3.3*  CL 92* 94* 96*  CO2 _0 GLUCOSE 112* 108* 91  BUN _1 CREATININE 0.54* 0.42* 0.52*  CALCIUM 8.5* 8.7* 8.5*   LFT Recent Labs    03/10/19 0549  PROT 6.0*  ALBUMIN 2.0*  AST 237*  ALT 93*  ALKPHOS 95  BILITOT 20.4*   PT/INR Recent Labs    03/07/19 1537 03/08/19 0551  LABPROT 24.9* 26.4*  INR 2.3* 2.5*   Hepatitis Panel Recent Labs   03/07/19 1527  HEPBSAG Negative  HCVAB 0.2  HEPAIGM Negative  HEPBIGM Negative    [place] Ascites (abdomen Limited)  Result Date: 03/09/2019 CLINICAL DATA:  Concern for intra-abdominal ascites. Please perform ascites search ultrasound and ultrasound-guided paracentesis as indicated. EXAM: LIMITED ABDOMEN ULTRASOUND FOR ASCITES TECHNIQUE: Limited ultrasound survey for ascites was performed in all four abdominal quadrants. COMPARISON:  CT abdomen pelvis-03/07/2019 FINDINGS: Sonographic evaluation of the abdomen demonstrates a trace amount of intra-abdominal ascites, too small to allow for safe ultrasound-guided paracentesis. No paracentesis attempted. IMPRESSION: Trace amount of intra-abdominal ascites, too small to allow for safe ultrasound-guided paracentesis. No paracentesis attempted. Electronically Signed   By: Sandi Mariscal M.D.   On: 03/09/2019 19:05    Assessment / Plan:  1. 48 y.o. male with EtOh Cirrhosis MELD Na 31 on Prednisolone 39m po QD. Alk phos 95. AST 237. ALT 93. Ammonia 75. Albumin 2.1. PLT 79. Cr 0.52. BUN 18. -Continue Prednisolone 4107mQD -Continue Lasix 2038mV bid -Start Spironolactone 53m61m QD -Monitor Hepatic panel daily  - Eventual EGD as outpatient to assess for EV and GV -patient is not a transplant candidate d/t ongoing EtOH abuse    2. Ascites secondary to # 1. IR attempted paracentesis 3/16, not enough ascitic fluid to tap. Patient remains afebrile. WBC  5.5 on 3/16. No abdominal  pain. No clinical evidence of SBP, re attempt paracentesis if ascites increases.  -Monitor CBC and temperature  3. Melena. Hg 16.4. HCT 47.9. Iron 84.  No further melena, last BM golden brown. Had streak or bright red blood on  TP this am. No rectal pain.  -monitor for further melena -Eventual EGD (assess for EV/GV)/colonoscopy as outpatient  -Protonix 39m po QD -check iron, TIBC and Ferritin level  4. Jaundice. T.bili  20.4 << 22.9 -follow hepatic panel daily   5.  Coagulopathy. INR 2.5. No complaints of easy bruising, nose bleeds or bleeding from gums. Small amount of red blood on TP after BM this am. -follow INR, consider Vitamin K 165mIV if INR increases  -monitor for bruising/bleeding  6. Thrombocytopenia. PLT 79 -monitor plt count   7. Hepatic Encephalopathy. Patient is cognitively intact.  -agree with Lactolose 20G  Po bid -Xifaxan 55067mo bid   8. Hypokalemia K 3.2.  -KCL replacement per hospitalist   9. ETOH abuse  -CIWA protocol  -continue folic acid, thiamine and multi vitamin   8. Paroxysmal atrial fibrillation. Regular rhythm, tachycardic this am.        LOS: 3 days   ColNoralyn Pick/17/2020, 10:10 AM   Attending physician's note   I have taken an interval history, reviewed the chart and examined the patient. I agree with the Advanced Practitioner's note, impression and recommendations.   48 year old with acute alcoholic hepatitis superimposed on decompensated liver cirrhosis with jaundice (TB 22), ascites, coagulopathy, thrombocytopenia and hepatic encephalopathy.  Not a candidate for liver transplant due to continued alcohol abuse. CT showing cirrhosis with upper abdominal varices, splenomegaly, ascites and ? Proctitis.  Ultrasound did not show any tappable ascites.  Plan:   -Continue steroids.  Taper over the next 4 weeks. -Continue Lasix, spironolactone, lactulose, rifaximin. -Recommend EGD and colonoscopy as an outpatient.   -No alcohol.   -Follow-up in the GI clinic in 4 to 6 weeks.  -We will sign off for now. -He wants to be DNR.  RajCarmell AustriaD

## 2019-03-10 NOTE — Progress Notes (Signed)
PROGRESS NOTE    Nicholas Price  AOZ:[id number] DOB: 16-Mar-1971 DOA: 03/07/2019 PCP: Lorre MunroeBaity, Regina W, NP    Brief Narrative:  48 year old gentleman with prior history of hypertension alcohol abuse depression, GERD, liver cirrhosis secondary to alcohol abuse, paroxysmal atrial fibrillation, anxiety presents today for generalized weakness and some shortness of breath. Patient was found to be in decompensated liver failure . GI consulted and recommendations given.   Assessment & Plan:   Active Problems:   Hypertension   Alcohol abuse   Alcohol withdrawal (HCC)   GERD (gastroesophageal reflux disease)   Hepatic failure (HCC)   Hyponatremia   Alcoholic cirrhosis of liver with ascites (HCC)   Elevated lipase   Elevated INR   Thrombocytopenia (HCC)   Anasarca   Hepatic encephalopathy (HCC)   Melena   Hyperbilirubinemia   Alcoholic hepatitis with ascites  Acute hepatic failure secondary to alcohol abuse Meld score 30 estimated 9725-month mortality about 52% GI consulted for liver failure. CT abdomen and pelvis showed Changes of cirrhosis of the liver with marked hepatic steatosis and evidence of portal venous hypertension with associated splenomegaly and upper abdominal varices. Moderate amount of ascites.  Concentric and eccentric rectal wall thickening, measuring 11 mm in maximum thickness distally. This could be due to proctitis. A neoplasm is less likely but not excluded. Patient was started on low-sodium diet at this time his ultrasound abdomen could not find any ascites on 2 different occasions.  Mild ascitex on exam, not enough to be aspirated.  He was started on steroids for fulminant liver failure.  Appreciate GI recommendations. He was started on IV lasix 20 mg BID, increased it to 40 mg BID, spironolactone was added to the regimen.  Maintain strict intake and output.   Hypertension Well-controlled continue with Lasix . Watch for hypotension with IV lasix.    History of  severe alcohol abuse Watch for signs of alcohol withdrawal. Patient is on CIWA protocol.  Pt having some tremors, added ativan 1 mg EVERY 4 HOURS prn.    GERD Stable   Mild thrombocytopenia No signs of bleeding continue to monitor.   Acute hepatic encephalopathy Started the patient on lactulose, trend ammonia levels. Added Xifaxan .    Hypokalemia: replaced.    Hyperbilirubinemia:  From liver failure.   History of melena Stool for occult blood is negative. Follow hemoglobin. He will need EGD eventually for evaluation of varices.  Pt currently denies any abd pain, hematochezia or hematemesis.    Hypokalemia Replaced   Elevated liver function tests Secondary to liver failure from alcohol abuse.   Elevated  lactic acid probably from alcohol intoxication  Dizziness earlier today while working with therapy.  Get orthostatic vital signs.     DVT prophylaxis: scd's Code Status: DNR Family Communication: none at bedside.  Disposition Plan: pending clinical improvement.    Consultants:   Gastroenterology Dr Barron Alvineirigliano.   Procedures: None.   Antimicrobials: none.   Subjective: No Nausea, vomiting, some  abdominal pain.  Having some tremors , added ativan.    Objective: Vitals:   03/10/19 0617 03/10/19 1026 03/10/19 1029 03/10/19 1445  BP: (!) 128/95 106/73 110/68 (!) 129/95  Pulse: 88 (!) 114 (!) 127 (!) 114  Resp: 20 16 20 20   Temp: 98.1 F (36.7 C)   97.8 F (36.6 C)  TempSrc:    Oral  SpO2: 95% 94% (!) 84% 91%  Weight:      Height:        Intake/Output Summary (Last  24 hours) at 03/10/2019 1634 Last data filed at 03/09/2019 1800 Gross per 24 hour  Intake 0 ml  Output --  Net 0 ml   Filed Weights   03/07/19 1544 03/07/19 2316  Weight: (!) 147.4 kg (!) 142.7 kg    Examination:  General exam: Appears calm and comfortable  Respiratory system: diminished at bases. o wheezing or rhonchi.  Cardiovascular system: S1 & S2 heard, RRR. No  JVD,  Gastrointestinal system: Abdomen is soft, tender generalized, more in the right upper quadrant. Distended. Bowel sounds good.  Central nervous system: Alert and oriented. No focal neurological deficits. Extremities: Symmetric 5 x 5 power. Skin: No rashes, lesions or ulcers Psychiatry:  Mood & affect appropriate.     Data Reviewed: I have personally reviewed following labs and imaging studies  CBC: Recent Labs  Lab 03/07/19 1543 03/08/19 0551 03/09/19 1022  WBC 5.0 3.9* 5.5  NEUTROABS 3.5  --  4.2  HGB 17.3* 16.4 17.0  HCT 48.8 47.9 51.4  MCV 99.0 101.9* 104.3*  PLT 73* 79* 84*   Basic Metabolic Panel: Recent Labs  Lab 03/07/19 1543 03/07/19 2124 03/08/19 0551 03/09/19 0533 03/10/19 0549  NA 131*  --  134* 136 138  K 3.4*  --  3.6 3.2* 3.3*  CL 89*  --  92* 94* 96*  CO2 29  --  29 30 31   GLUCOSE 103*  --  112* 108* 91  BUN 13  --  14 18 18   CREATININE 0.49*  --  0.54* 0.42* 0.52*  CALCIUM 8.8*  --  8.5* 8.7* 8.5*  MG  --  2.1 2.3  --   --   PHOS  --  2.9 2.7  --   --    GFR: Estimated Creatinine Clearance: 173.6 mL/min (A) (by C-G formula based on SCr of 0.52 mg/dL (L)). Liver Function Tests: Recent Labs  Lab 03/07/19 1543 03/08/19 0551 03/09/19 0533 03/10/19 0549  AST 328* 284* 263* 237*  ALT 112* 99* 99* 93*  ALKPHOS 116 95 99 95  BILITOT 25.4* 21.8* 22.9* 20.4*  PROT 6.7 5.9* 6.3* 6.0*  ALBUMIN 2.5* 2.1* 2.1* 2.0*   Recent Labs  Lab 03/07/19 1543 03/08/19 1108  LIPASE 165* 158*   Recent Labs  Lab 03/07/19 1543 03/08/19 0551  AMMONIA 90* 75*   Coagulation Profile: Recent Labs  Lab 03/07/19 1537 03/08/19 0551  INR 2.3* 2.5*   Cardiac Enzymes: No results for input(s): CKTOTAL, CKMB, CKMBINDEX, TROPONINI in the last 168 hours. BNP (last 3 results) No results for input(s): PROBNP in the last 8760 hours. HbA1C: Recent Labs    03/08/19 0551  HGBA1C 4.8   CBG: No results for input(s): GLUCAP in the last 168 hours. Lipid  Profile: No results for input(s): CHOL, HDL, LDLCALC, TRIG, CHOLHDL, LDLDIRECT in the last 72 hours. Thyroid Function Tests: Recent Labs    03/08/19 0551  TSH 1.095   Anemia Panel: Recent Labs    03/09/19 0533  FERRITIN >7,500*  TIBC <70*  IRON 84   Sepsis Labs: Recent Labs  Lab 03/07/19 1601 03/07/19 2351 03/08/19 0551 03/08/19 1108  LATICACIDVEN 2.2* 2.7* 2.3* 2.4*    Recent Results (from the past 240 hour(s))  Urine culture     Status: Abnormal   Collection Time: 03/07/19  4:01 PM  Result Value Ref Range Status   Specimen Description   Final    URINE, RANDOM Performed at [facility], [address], [place], [place] [address]  Special Requests   Final    NONE Performed at [facility], [address] [place], [place] [address]    Culture (A)  Final    <10,000 COLONIES/mL INSIGNIFICANT GROWTH Performed at [facility], [address], [place], [place] [address]    Report Status 03/09/2019 FINAL  Final  Blood culture (routine x 2)     Status: None (Preliminary result)   Collection Time: 03/07/19  4:01 PM  Result Value Ref Range Status   Specimen Description   Final    BLOOD RIGHT HAND Performed at [facility], [address] [place], [place] [address]    Special Requests   Final    BOTTLES DRAWN AEROBIC AND ANAEROBIC Blood Culture adequate volume Performed at [facility], [address], [place], [place] [address]    Culture   Final    NO GROWTH 3 DAYS Performed at [facility], [address], [place], [place] [address]    Report Status PENDING  Incomplete  Blood culture (routine x 2)     Status: None (Preliminary result)   Collection Time: 03/07/19  4:06 PM  Result Value Ref Range Status   Specimen Description   Final    BLOOD LEFT HAND Performed at [facility], [address] [place], [place] [address]    Special Requests   Final     BOTTLES DRAWN AEROBIC AND ANAEROBIC Blood Culture adequate volume Performed at [facility], [address], [place], [place] [address]    Culture   Final    NO GROWTH 3 DAYS Performed at [facility], [address] [place], [place] [address]    Report Status PENDING  Incomplete         Radiology Studies: [place] Ascites (abdomen Limited)  Result Date: 03/09/2019 CLINICAL DATA:  Concern for intra-abdominal ascites. Please perform ascites search ultrasound and ultrasound-guided paracentesis as indicated. EXAM: LIMITED ABDOMEN ULTRASOUND FOR ASCITES TECHNIQUE: Limited ultrasound survey for ascites was performed in all four abdominal quadrants. COMPARISON:  CT abdomen pelvis-03/07/2019 FINDINGS: Sonographic evaluation of the abdomen demonstrates a trace amount of intra-abdominal ascites, too small to allow for safe ultrasound-guided paracentesis. No paracentesis attempted. IMPRESSION: Trace amount of intra-abdominal ascites, too small to allow for safe ultrasound-guided paracentesis. No paracentesis attempted. Electronically Signed   By: Simonne Come M.D.   On: 03/09/2019 19:05        Scheduled Meds:  escitalopram  20 mg Oral Daily   folic acid  1 mg Oral Daily   furosemide  40 mg Intravenous Q12H   lactulose  20 g Oral BID   multivitamin with minerals  1 tablet Oral Daily   pantoprazole  40 mg Oral Daily   potassium chloride  40 mEq Oral BID   prednisoLONE  40 mg Oral Daily   rifaximin  550 mg Oral BID   sodium chloride flush  3 mL Intravenous Q12H   spironolactone  25 mg Oral Daily   thiamine  100 mg Oral Daily   Or   thiamine  100 mg Intravenous Daily   Continuous Infusions:  sodium chloride       LOS: 3 days    Time spent: 32 minutes    Kathlen Mody, MD [facility] Pager [phone]  If 7PM-7AM, please contact night-coverage [email or web address] Password [facility] 03/10/2019, 4:34 PM

## 2019-03-10 NOTE — Consult Note (Signed)
Consultation Note Date: 03/10/2019   Patient Name: Nicholas Price  DOB: 02/23/1971  MRN: [id number]  Age / Sex: 48 y.o., male  PCP: Lorre Munroe, NP Referring Physician: Kathlen Mody, MD  Reason for Consultation: Establishing goals of care and Psychosocial/spiritual support  HPI/Patient Profile: 48 y.o. male  admitted on 03/07/2019 with a significant past medical history  of alcohol abuse, liver disease, sever hepatic steatosis, upper abdominal varices secondary to portal hypertension noted on on CT scan done on admission, with thrombocytopenia, transaminitis hypoalbuminemia, hyponatremia and coagulopathy and asterixis and anasarca-Meld score was 30 yesterday,  depression, anxiety, atrial fibrillation, hypertension.    Presented with   generalized fatigue and jaundice.  Came from home complaining shortness of breath no appetite and generalized weakness     On arrival in the ER he was found to have a total bilirubin of 25.4 with an AST of 320 and ALT of 112 alkaline phosphatase of 116 and albumin of 2.5 creatinine was 1.49 with a platelets of 73,000.  He lives locally in [place] with his mother. Has h/o depression, SI ideation/denies currently.  Patient faces treatment option decisions, advanced directive decisions and anticipatory care needs.  Clinical Assessment and Goals of Care:   This NP Lorinda Creed reviewed medical records, received report from team, assessed the patient and then meet at the patient's bedside   to discuss diagnosis, prognosis, GOC, disposition and options.  Concept of Hospice and Palliative Care were discussed  Created space and opportunity for patient to share thoughts and feelings regarding currently medical and emotional situation.  He shares his understanding of the seriousness of his medical situation and guilt over "I did this to myself"  He has been active in AA in the past  and sober for as long as 4.5 years.  He is open to support from AA currently.  Will contact AA support person for this patient.  Emotional support offered.  A detailed discussion was had today regarding advanced directives.  Concepts specific to code status, artifical feeding and hydration, continued IV antibiotics and rehospitalization was had.  The difference between a aggressive medical intervention path  and a palliative comfort care path for this patient at this time was had.    Values and goals of care important to patient  were attempted to be elicited.  Questions and concerns addressed.  Patient encouraged to call with questions or concerns.    PMT will continue to support holistically.  Will f/u in the morning.  No documented HPOA -stressed the importance of discussion and documentation of his wishes and offered support form spiritual care.  SUMMARY OF RECOMMENDATIONS    Code Status/Advance Care Planning:  DNR   Palliative Prophylaxis:   Bowel Regimen and Frequent Pain Assessment  Additional Recommendations (Limitations, Scope, Preferences):  Full Scope Treatment   Patient is open to all offered and available medical interventions to  prolong life, quality is a priority.  Work on  sobriety with help of AA  Psycho-social/Spiritual:   Desire for further [employer]  support: yes  Additional Recommendations: Education on Hospice  Prognosis:   Unable to determine  Discharge Planning: Home with Palliative Services      Primary Diagnoses: Present on Admission: . Hepatic failure (HCC) . Hyponatremia . Hypertension . Alcohol abuse . Alcohol withdrawal (HCC) . GERD (gastroesophageal reflux disease) . Alcoholic cirrhosis of liver with ascites (HCC) . Elevated lipase . Elevated INR . Thrombocytopenia (HCC) . Anasarca . Melena   I have reviewed the medical record, interviewed the patient and family, and examined the patient. The following aspects are  pertinent.  Past Medical History:  Diagnosis Date  . A-fib (HCC)   . Alcohol abuse   . Anxiety   . Depression   . Hypertension    Social History   Socioeconomic History  . Marital status: Legally Separated    Spouse name: Not on file  . Number of children: Not on file  . Years of education: Not on file  . Highest education level: Not on file  Occupational History  . Not on file  Social Needs  . Financial resource strain: Not on file  . Food insecurity:    Worry: Not on file    Inability: Not on file  . Transportation needs:    Medical: Not on file    Non-medical: Not on file  Tobacco Use  . Smoking status: Former Smoker    Types: Cigarettes  . Smokeless tobacco: Never Used  . Tobacco comment: quit 2013  Substance and Sexual Activity  . Alcohol use: Yes    Comment: Pt states he drinks about 1 beer to a 1/5th of liquor per day,  and his last drink  last nite at 2am  . Drug use: No  . Sexual activity: Not Currently  Lifestyle  . Physical activity:    Days per week: Not on file    Minutes per session: Not on file  . Stress: Not on file  Relationships  . Social connections:    Talks on phone: Not on file    Gets together: Not on file    Attends religious service: Not on file    Active member of club or organization: Not on file    Attends meetings of clubs or organizations: Not on file    Relationship status: Not on file  Other Topics Concern  . Not on file  Social History Narrative  . Not on file   Family History  Problem Relation Age of Onset  . Anxiety disorder Brother   . Cancer Neg Hx   . Diabetes Neg Hx   . Stroke Neg Hx   . Heart disease Neg Hx    Scheduled Meds: . escitalopram  20 mg Oral Daily  . folic acid  1 mg Oral Daily  . furosemide  20 mg Intravenous Q12H  . lactulose  20 g Oral BID  . multivitamin with minerals  1 tablet Oral Daily  . pantoprazole  40 mg Oral Daily  . potassium chloride  40 mEq Oral BID  . prednisoLONE  40 mg Oral  Daily  . rifaximin  550 mg Oral BID  . sodium chloride flush  3 mL Intravenous Q12H  . thiamine  100 mg Oral Daily   Or  . thiamine  100 mg Intravenous Daily   Continuous Infusions: . sodium chloride     PRN Meds:.sodium chloride, hydrOXYzine, LORazepam **OR** LORazepam, ondansetron **OR** ondansetron (ZOFRAN) IV, sodium chloride flush, traZODone Medications Prior to Admission:  Prior to Admission medications  Medication Sig Start Date End Date Taking? Authorizing Provider  escitalopram (LEXAPRO) 20 MG tablet Take 1 tablet (20 mg total) by mouth daily. For mood 12/20/18  Yes Aldean Baker, NP  hydrOXYzine (ATARAX/VISTARIL) 25 MG tablet Take 1 tablet (25 mg total) by mouth 3 (three) times daily as needed for anxiety or nausea. 12/19/18  Yes Aldean Baker, NP  ibuprofen (ADVIL,MOTRIN) 200 MG tablet Take 400-600 mg by mouth every 6 (six) hours as needed for mild pain.   Yes [provider]  metoprolol tartrate 75 MG TABS Take 150 mg by mouth 2 (two) times daily. For high blood pressure 12/19/18  Yes Aldean Baker, NP  traZODone (DESYREL) 50 MG tablet Take 1 tablet (50 mg total) by mouth at bedtime as needed for sleep. 12/19/18  Yes Aldean Baker, NP   No Known Allergies Review of Systems  Constitutional: Positive for fatigue.  Neurological: Positive for weakness.    Physical Exam Abdominal:     General: There is distension.  Neurological:     Mental Status: He is alert and oriented to person, place, and time.     Vital Signs: BP (!) 128/95 (BP Location: Right Arm)   Pulse 88   Temp 98.1 F (36.7 C)   Resp 20   Ht 6\' 2"  (1.88 m)   Wt (!) 142.7 kg   SpO2 95%   BMI 40.39 kg/m  Pain Scale: 0-10   Pain Score: 0-No pain   SpO2: SpO2: 95 % O2 Device:SpO2: 95 % O2 Flow Rate: .O2 Flow Rate (L/min): 1 L/min  IO: Intake/output summary:   Intake/Output Summary (Last 24 hours) at 03/10/2019 0925 Last data filed at 03/09/2019 1800 Gross per 24 hour  Intake 243 ml   Output -  Net 243 ml    LBM: Last BM Date: 03/09/19 Baseline Weight: Weight: (!) 147.4 kg Most recent weight: Weight: (!) 142.7 kg      Palliative Assessment/Data: 40% at best   Flowsheet Rows     Most Recent Value  Intake Tab  Referral Department  Hospitalist  Unit at Time of Referral  Med/Surg Unit  Date Notified  03/09/19  Palliative Care Type  New Palliative care  Reason for referral  Clarify Goals of Care  Date of Admission  03/07/19  # of days IP prior to Palliative referral  2  Clinical Assessment  Psychosocial & Spiritual Assessment  Palliative Care Outcomes     Discussed with bedside RN  Time In: 0800 Time Out: 0915 Time Total: 75 minutes Greater than 50%  of this time was spent counseling and coordinating care related to the above assessment and plan.  Signed by: Lorinda Creed, NP   Please contact Palliative Medicine Team phone at [phone] for questions and concerns.  For individual provider: See Loretha Stapler

## 2019-03-11 DIAGNOSIS — R17 Unspecified jaundice: Secondary | ICD-10-CM

## 2019-03-11 LAB — COMPREHENSIVE METABOLIC PANEL
ALBUMIN: 2 g/dL — AB (ref 3.5–5.0)
ALT: 108 U/L — AB (ref 0–44)
AST: 253 U/L — AB (ref 15–41)
Alkaline Phosphatase: 111 U/L (ref 38–126)
Anion gap: 10 (ref 5–15)
BUN: 20 mg/dL (ref 6–20)
CO2: 31 mmol/L (ref 22–32)
CREATININE: 0.65 mg/dL (ref 0.61–1.24)
Calcium: 8.6 mg/dL — ABNORMAL LOW (ref 8.9–10.3)
Chloride: 98 mmol/L (ref 98–111)
GFR calc Af Amer: >60 mL/min (ref >60)
GFR calc non Af Amer: >60 mL/min (ref >60)
GLUCOSE: 117 mg/dL — AB (ref 70–99)
Potassium: 3.5 mmol/L (ref 3.5–5.1)
Sodium: 139 mmol/L (ref 135–145)
Total Bilirubin: 21.7 mg/dL (ref 0.3–1.2)
Total Protein: 6.2 g/dL — ABNORMAL LOW (ref 6.5–8.1)

## 2019-03-11 LAB — HEPATITIS B SURFACE ANTIGEN: Hepatitis B Surface Ag: NEGATIVE

## 2019-03-11 LAB — HEPATITIS B SURFACE ANTIBODY, QUANTITATIVE: Hep B S AB Quant (Post): <3.1 m[IU]/mL — ABNORMAL LOW (ref >9.9)

## 2019-03-11 LAB — HEPATITIS A ANTIBODY, TOTAL: HEP A TOTAL AB: NEGATIVE

## 2019-03-11 LAB — HEPATITIS C ANTIBODY: HCV Ab: 0.1 s/co ratio (ref 0.0–0.9)

## 2019-03-11 LAB — HEPATITIS B CORE ANTIBODY, TOTAL: Hep B Core Total Ab: NEGATIVE

## 2019-03-11 NOTE — Progress Notes (Signed)
Patient ID: Nicholas Price, male   DOB: [birth year], 48 y.o.   MRN: [id number]  This NP visited patient at the bedside as a follow up to  yesterday's GOCs meeting.  Continued conversation with the patient regarding diagnosis, prognosis, goals of care, end-of-life wishes, disposition and options.   Patient verbalizes his understanding of the seriousness of his current medical situation.  He voices his concern about anticipatory care needs.  He currently lives at home with his mother but the living situation is less than optimal.  He hopes that he would be able to return home to his mother's house for "a little while".  Nancy/patietn's mother- [phone].  I spoke to [place] regarding the current medical situation and she verbalizes her great apprehension regarding her ability to care for Nicholas Price at this time.  Patient's mother tells me that she "knows his time is coming".  She reports that she is seeing him continue to decline "dramatically" over the last 6 months.  Plan is to have a another phone conversation with the patient, his mother and father tomorrow morning at 0900 am to discuss current medical situation, goals of care, and transitions of care.   Discussed with patient the importance of continued conversation with his  family and the  medical providers regarding overall plan of care and treatment options,  ensuring decisions are within the context of the patients values and GOCs.  Questions and concerns addressed     Total time spent on the unit was 25 minutes  Greater than 50% of the time was spent in counseling and coordination of care  Lorinda Creed NP  Palliative Medicine Team Team Phone # [phone] Pager [phone]

## 2019-03-11 NOTE — Progress Notes (Signed)
PROGRESS NOTE  Nicholas Price QDI:[id number] DOB: December 16, 1971 DOA: 03/07/2019 PCP: Lorre Munroe, NP   LOS: 4 days   Brief Narrative / Interim history: 48 year old gentleman with prior history of hypertension alcohol abuse depression, GERD, liver cirrhosis secondary to alcohol abuse, paroxysmal atrial fibrillation, anxiety presents today for generalized weakness and some shortness of breath. Patient was found to be in decompensated liver failure . GI consulted and recommendations given.   Subjective: No abdominal pain, no nausea or vomiting.  Feels a bit shaky this morning  Assessment & Plan: Active Problems:   Hypertension   Alcohol abuse   Alcohol withdrawal (HCC)   GERD (gastroesophageal reflux disease)   Hepatic failure (HCC)   Hyponatremia   Alcoholic cirrhosis of liver with ascites (HCC)   Elevated lipase   Elevated INR   Thrombocytopenia (HCC)   Anasarca   Hepatic encephalopathy (HCC)   Melena   Hyperbilirubinemia   Alcoholic hepatitis with ascites   Palliative care by specialist   DNR (do not resuscitate)   Jaundice   Principal Problem Acute alcoholic hepatitis in the setting of underlying alcohol induced cirrhosis -GI consulted and following, discussed with Dr. Chales Abrahams -Patient placed on prednisolone, continue for 28 days -LFTs are still elevated but overall stable -We will need an EGD as an outpatient -Patient placed on diuretics, lactulose, continue  Active Problems Fluid overload/edema -Likely in the setting of liver disease, patient was started on diuretics, will need Lasix and spironolactone on discharge -Ultrasound of the abdomen without any significant ascites to be tapped  Melena with concern for GI bleed -Hemoglobin overall stable, will need EGD as an outpatient  Coagulopathy, thrombocytopenia -In the setting of liver disease, monitor INR and platelets  Alcohol abuse -Patient determined to quit  Paroxysmal A. fib -Not a candidate for  anticoagulation  Scheduled Meds: . escitalopram  20 mg Oral Daily  . folic acid  1 mg Oral Daily  . furosemide  40 mg Intravenous Q12H  . lactulose  20 g Oral BID  . multivitamin with minerals  1 tablet Oral Daily  . pantoprazole  40 mg Oral Daily  . prednisoLONE  40 mg Oral Daily  . rifaximin  550 mg Oral BID  . sodium chloride flush  3 mL Intravenous Q12H  . spironolactone  25 mg Oral Daily  . thiamine  100 mg Oral Daily   Or  . thiamine  100 mg Intravenous Daily   Continuous Infusions: . sodium chloride     PRN Meds:.sodium chloride, hydrOXYzine, LORazepam, ondansetron **OR** ondansetron (ZOFRAN) IV, sodium chloride flush, traZODone  DVT prophylaxis: SCDs Code Status: DNR Family Communication: No family at bedside Disposition Plan: Home when ready, likely 24 hours  Consultants:   Gastroenterology  Procedures:   None   Antimicrobials:  None   Objective: Vitals:   03/10/19 1029 03/10/19 1445 03/10/19 2001 03/11/19 0516  BP: 110/68 (!) 129/95 (!) 117/95 (!) 124/93  Pulse: (!) 127 (!) 114 (!) 103 95  Resp: 20 20 20  (!) 21  Temp:  97.8 F (36.6 C) 98.5 F (36.9 C) 98.4 F (36.9 C)  TempSrc:  Oral Oral Oral  SpO2: (!) 84% 91% 92% 93%  Weight:      Height:        Intake/Output Summary (Last 24 hours) at 03/11/2019 1318 Last data filed at 03/11/2019 0931 Gross per 24 hour  Intake 3 ml  Output 650 ml  Net -647 ml   Filed Weights   03/07/19 1544 03/07/19 2316  Weight: (!) 147.4 kg (!) 142.7 kg    Examination:  Constitutional: NAD, tremulous Eyes: PERRL, scleral icterus present ENMT: Mucous membranes are moist.  Respiratory: clear to auscultation bilaterally, no wheezing, no crackles. Normal respiratory effort.  Decreased sounds at the bases.  Cardiovascular: Regular rate and rhythm, no murmurs / rubs / gallops.  1+ LE edema. 2+ pedal pulses. Abdomen: no tenderness. Bowel sounds positive.  Musculoskeletal: no clubbing / cyanosis. Skin: no rashes  Neurologic: CN 2-12 grossly intact. Strength 5/5 in all 4.   Data Reviewed: I have independently reviewed following labs and imaging studies    CBC: Recent Labs  Lab 03/25/2019 1543 03/08/19 0551 03/09/19 1022  WBC 5.0 3.9* 5.5  NEUTROABS 3.5  --  4.2  HGB 17.3* 16.4 17.0  HCT 48.8 47.9 51.4  MCV 99.0 101.9* 104.3*  PLT 73* 79* 84*   Basic Metabolic Panel: Recent Labs  Lab 25-Mar-2019 1543 25-Mar-2019 2124 03/08/19 0551 03/09/19 0533 03/10/19 0549 03/11/19 0538  NA 131*  --  134* 136 138 139  K 3.4*  --  3.6 3.2* 3.3* 3.5  CL 89*  --  92* 94* 96* 98  CO2 29  --  GLUCOSE 103*  --  112* 108* 91 117*  BUN 13  --  CREATININE 0.49*  --  0.54* 0.42* 0.52* 0.65  CALCIUM 8.8*  --  8.5* 8.7* 8.5* 8.6*  MG  --  2.1 2.3  --   --   --   PHOS  --  2.9 2.7  --   --   --    GFR: Estimated Creatinine Clearance: 173.6 mL/min (by C-G formula based on SCr of 0.65 mg/dL). Liver Function Tests: Recent Labs  Lab 03-25-19 1543 03/08/19 0551 03/09/19 0533 03/10/19 0549 03/11/19 0538  AST 328* 284* 263* 237* 253*  ALT 112* 99* 99* 93* 108*  ALKPHOS 116 95 99 95 111  BILITOT 25.4* 21.8* 22.9* 20.4* 21.7*  PROT 6.7 5.9* 6.3* 6.0* 6.2*  ALBUMIN 2.5* 2.1* 2.1* 2.0* 2.0*   Recent Labs  Lab Mar 25, 2019 1543 03/08/19 1108  LIPASE 165* 158*   Recent Labs  Lab Mar 25, 2019 1543 03/08/19 0551  AMMONIA 90* 75*   Coagulation Profile: Recent Labs  Lab 2019/03/25 1537 03/08/19 0551  INR 2.3* 2.5*   Cardiac Enzymes: No results for input(s): CKTOTAL, CKMB, CKMBINDEX, TROPONINI in the last 168 hours. BNP (last 3 results) No results for input(s): PROBNP in the last 8760 hours. HbA1C: No results for input(s): HGBA1C in the last 72 hours. CBG: No results for input(s): GLUCAP in the last 168 hours. Lipid Profile: No results for input(s): CHOL, HDL, LDLCALC, TRIG, CHOLHDL, LDLDIRECT in the last 72 hours. Thyroid Function Tests: No results for input(s): TSH, T4TOTAL,  FREET4, T3FREE, THYROIDAB in the last 72 hours. Anemia Panel: Recent Labs    03/09/19 0533  FERRITIN >7,500*  TIBC <70*  IRON 84   Urine analysis:    Component Value Date/Time   COLORURINE BROWN (A) 03/25/2019 1546   APPEARANCEUR CLOUDY (A) 03-25-2019 1546   LABSPEC  03-25-19 1546    TEST NOT REPORTED DUE TO COLOR INTERFERENCE OF URINE PIGMENT   PHURINE  March 25, 2019 1546    TEST NOT REPORTED DUE TO COLOR INTERFERENCE OF URINE PIGMENT   GLUCOSEU (A) 03-25-2019 1546    TEST NOT REPORTED DUE TO COLOR INTERFERENCE OF URINE PIGMENT   HGBUR (A) 03-25-2019 1546    TEST NOT REPORTED  DUE TO COLOR INTERFERENCE OF URINE PIGMENT   BILIRUBINUR (A) 03/07/2019 1546    TEST NOT REPORTED DUE TO COLOR INTERFERENCE OF URINE PIGMENT   KETONESUR (A) 03/07/2019 1546    TEST NOT REPORTED DUE TO COLOR INTERFERENCE OF URINE PIGMENT   PROTEINUR (A) 03/07/2019 1546    TEST NOT REPORTED DUE TO COLOR INTERFERENCE OF URINE PIGMENT   NITRITE (A) 03/07/2019 1546    TEST NOT REPORTED DUE TO COLOR INTERFERENCE OF URINE PIGMENT   LEUKOCYTESUR (A) 03/07/2019 1546    TEST NOT REPORTED DUE TO COLOR INTERFERENCE OF URINE PIGMENT   Sepsis Labs: Invalid input(s): PROCALCITONIN, LACTICIDVEN  Recent Results (from the past 240 hour(s))  Urine culture     Status: Abnormal   Collection Time: 03/07/19  4:01 PM  Result Value Ref Range Status   Specimen Description   Final    URINE, RANDOM Performed at [facility], [address] [place], [place] [address]    Special Requests   Final    NONE Performed at [facility], [address] [place], [place] [address]    Culture (A)  Final    <10,000 COLONIES/mL INSIGNIFICANT GROWTH Performed at [facility], [address] [place], [place] [address]    Report Status 03/09/2019 FINAL  Final  Blood culture (routine x 2)     Status: None (Preliminary result)   Collection Time: 03/07/19  4:01 PM  Result Value Ref Range  Status   Specimen Description   Final    BLOOD RIGHT HAND Performed at [facility], [address], [place], [place] [address]    Special Requests   Final    BOTTLES DRAWN AEROBIC AND ANAEROBIC Blood Culture adequate volume Performed at [facility], [address] [place], [place] [address]    Culture   Final    NO GROWTH 4 DAYS Performed at [facility], [address] [place], [place] [address]    Report Status PENDING  Incomplete  Blood culture (routine x 2)     Status: None (Preliminary result)   Collection Time: 03/07/19  4:06 PM  Result Value Ref Range Status   Specimen Description   Final    BLOOD LEFT HAND Performed at [facility], [address] [place], [place] [address]    Special Requests   Final    BOTTLES DRAWN AEROBIC AND ANAEROBIC Blood Culture adequate volume Performed at [facility], [address] [place], [place] [address]    Culture   Final    NO GROWTH 4 DAYS Performed at [facility], [address], [place], [place] [address]    Report Status PENDING  Incomplete      Radiology Studies: [place] Ascites (abdomen Limited)  Result Date: 03/09/2019 CLINICAL DATA:  Concern for intra-abdominal ascites. Please perform ascites search ultrasound and ultrasound-guided paracentesis as indicated. EXAM: LIMITED ABDOMEN ULTRASOUND FOR ASCITES TECHNIQUE: Limited ultrasound survey for ascites was performed in all four abdominal quadrants. COMPARISON:  CT abdomen pelvis-03/07/2019 FINDINGS: Sonographic evaluation of the abdomen demonstrates a trace amount of intra-abdominal ascites, too small to allow for safe ultrasound-guided paracentesis. No paracentesis attempted. IMPRESSION: Trace amount of intra-abdominal ascites, too small to allow for safe ultrasound-guided paracentesis. No paracentesis attempted. Electronically Signed   By: Simonne Come M.D.   On: 03/09/2019 19:05    Pamella Pert, MD, PhD [facility]  Contact via  [email or web address]  [facility]  Office Info P: [phone]  F: [phone]

## 2019-03-11 NOTE — Progress Notes (Signed)
Occupational Therapy Treatment Patient Details Name: Nicholas Price MRN: [id number] DOB: March 22, 1971 Today's Date: 03/11/2019    History of present illness 48 year old gentleman with prior history of hypertension alcohol abuse depression, GERD, liver cirrhosis secondary to alcohol abuse, paroxysmal atrial fibrillation, anxiety presents today for generalized weakness and some shortness of breath. Dx of hepatic failure, EtOH withdrawal.    OT comments  Reviewed AE--pt still  cues for reacher with LB dressing (and min A), cues for sock aide (no physical assist) and min guard when ambulating to bathroom and in hallway due to unsteadiness but no LOB. Reviewed energy conservation. Even though pt has a seat in tub, he may want to sponge bathe initially for energy conservation   Follow Up Recommendations  Supervision/Assistance - 24 hour    Equipment Recommendations  None recommended by OT    Recommendations for Other Services      Precautions / Restrictions Precautions Precautions: Fall       Mobility Bed Mobility Overal bed mobility: Modified Independent                Transfers Overall transfer level: Modified independent                    Balance                                           ADL either performed or assessed with clinical judgement   ADL                       Lower Body Dressing: Minimal assistance Lower Body Dressing Details (indicate cue type and reason): for reacher; able to  Toilet Transfer: Min Pension scheme manager Details (indicate cue type and reason): unsteady but no LOB Toileting- Clothing Manipulation and Hygiene: Modified independent         General ADL Comments: pt with tremor.  Min A to remove socks with reacher; able to use sock aide with set up/supervision. Ambulated in hall afterwards at min guard level due to unsteady gait but no LOB.   Pt states he feels he can get into tub with rail:  noted he  simulated this yesterday. When walking, reviewed energy conservation. Pt states he feels confused at times.  Practiced retrieving low items with reacher     Vision       Perception     Praxis      Cognition Arousal/Alertness: Awake/alert Behavior During Therapy: WFL for tasks assessed/performed                                   General Comments: pt reports feeling confused at times; wfls during ot        Exercises     Shoulder Instructions       General Comments      Pertinent Vitals/ Pain       Pain Assessment: No/denies pain  Home Living                                          Prior Functioning/Environment              Frequency  Min 2X/week  Progress Toward Goals  OT Goals(current goals can now be found in the care plan section)  Progress towards OT goals: Progressing toward goals     Plan      Co-evaluation                 AM-PAC OT "6 Clicks" Daily Activity     Outcome Measure   Help from another person eating meals?: None Help from another person taking care of personal grooming?: None Help from another person toileting, which includes using toliet, bedpan, or urinal?: None Help from another person bathing (including washing, rinsing, drying)?: None Help from another person to put on and taking off regular upper body clothing?: None Help from another person to put on and taking off regular lower body clothing?: A Little 6 Click Score: 23    End of Session    OT Visit Diagnosis: Muscle weakness (generalized) (M62.81)   Activity Tolerance Patient tolerated treatment well   Patient Left in bed;with call bell/phone within reach   Nurse Communication          Time: 8588-5027 OT Time Calculation (min): 18 min  Charges: OT General Charges $OT Visit: 1 Visit OT Treatments $Self Care/Home Management : 8-22 mins  Marica Otter, OTR/L Acute Rehabilitation Services [phone] [facility]  pager [phone] office 03/11/2019   Shallyn Constancio 03/11/2019, 2:39 PM

## 2019-03-11 NOTE — Progress Notes (Addendum)
Patient had small to moderate BM with large amount of blood. Asymptomatic. Will continue to monitor.

## 2019-03-12 DIAGNOSIS — R531 Weakness: Secondary | ICD-10-CM

## 2019-03-12 DIAGNOSIS — K7031 Alcoholic cirrhosis of liver with ascites: Secondary | ICD-10-CM

## 2019-03-12 LAB — CBC
HCT: 51.3 % (ref 39.0–52.0)
Hemoglobin: 17.8 g/dL — ABNORMAL HIGH (ref 13.0–17.0)
MCH: 35.9 pg — ABNORMAL HIGH (ref 26.0–34.0)
MCHC: 34.7 g/dL (ref 30.0–36.0)
MCV: 103.4 fL — ABNORMAL HIGH (ref 80.0–100.0)
Platelets: 74 10*3/uL — ABNORMAL LOW (ref 150–400)
RBC: 4.96 MIL/uL (ref 4.22–5.81)
RDW: 20.7 % — ABNORMAL HIGH (ref 11.5–15.5)
WBC: 6.2 10*3/uL (ref 4.0–10.5)
nRBC: 0 % (ref 0.0–0.2)

## 2019-03-12 LAB — COMPREHENSIVE METABOLIC PANEL
ALT: 119 U/L — ABNORMAL HIGH (ref 0–44)
AST: 258 U/L — ABNORMAL HIGH (ref 15–41)
Albumin: 1.9 g/dL — ABNORMAL LOW (ref 3.5–5.0)
Alkaline Phosphatase: 112 U/L (ref 38–126)
Anion gap: 12 (ref 5–15)
BUN: 21 mg/dL — ABNORMAL HIGH (ref 6–20)
CO2: 31 mmol/L (ref 22–32)
Calcium: 8.5 mg/dL — ABNORMAL LOW (ref 8.9–10.3)
Chloride: 96 mmol/L — ABNORMAL LOW (ref 98–111)
Creatinine, Ser: 0.52 mg/dL — ABNORMAL LOW (ref 0.61–1.24)
GFR calc non Af Amer: >60 mL/min (ref >60)
Glucose, Bld: 104 mg/dL — ABNORMAL HIGH (ref 70–99)
Potassium: 3.4 mmol/L — ABNORMAL LOW (ref 3.5–5.1)
Sodium: 139 mmol/L (ref 135–145)
Total Bilirubin: 21.8 mg/dL (ref 0.3–1.2)
Total Protein: 6.2 g/dL — ABNORMAL LOW (ref 6.5–8.1)

## 2019-03-12 LAB — CULTURE, BLOOD (ROUTINE X 2)
CULTURE: NO GROWTH
Culture: NO GROWTH
Special Requests: ADEQUATE
Special Requests: ADEQUATE

## 2019-03-12 LAB — PROTIME-INR
INR: 2.1 — ABNORMAL HIGH (ref 0.8–1.2)
Prothrombin Time: 23.7 seconds — ABNORMAL HIGH (ref 11.4–15.2)

## 2019-03-12 MED ORDER — PANTOPRAZOLE SODIUM 40 MG PO TBEC: 40.0000 mg | DELAYED_RELEASE_TABLET | Freq: Every day | ORAL | 1 refills | Status: DC

## 2019-03-12 MED ORDER — SPIRONOLACTONE 25 MG PO TABS: 25.0000 mg | ORAL_TABLET | Freq: Every day | ORAL | 1 refills | Status: DC

## 2019-03-12 MED ORDER — PREDNISOLONE 5 MG PO TABS: 40.0000 mg | ORAL_TABLET | Freq: Every day | ORAL | 0 refills | Status: DC

## 2019-03-12 MED ORDER — LACTULOSE 10 GM/15ML PO SOLN: 20.0000 g | Freq: Two times a day (BID) | ORAL | 1 refills | Status: DC

## 2019-03-12 MED ORDER — FUROSEMIDE 40 MG PO TABS: 40.0000 mg | ORAL_TABLET | Freq: Every day | ORAL | 1 refills | Status: DC

## 2019-03-12 NOTE — Progress Notes (Signed)
Patient ID: Nicholas Price, male   DOB: 02-29-1971, 48 y.o.   MRN: [id number]  This NP visited patient at the bedside as a follow up for palliative medicine needs and emotional support; and to have a conference call with the patient's mother Harriett Sine #[phone] and his father Renae Fickle # [phone] for continued conversation regarding diagnosis, prognosis, goals of care, end-of-life wishes, disposition and options.  Patient and his mother both verbalized an understanding of the seriousness of his current medical situation and the limited prognosis. Patient's mother describes continued physical, functional and cognitive decline over the past many months.     His father was somewhat surprised by this however he has not seen his son for 4 months.  Today the patient has made a decision to shift to a full comfort path, foregoing any life prolonging measures.  Plan of care: -DNR/DNI -Comfort, quality and dignity are the focus of care,  allowing for a   natural death. -Avoid rehospitalization -No artificial feeding or hydration now or in the future, diet as           tolerated -No further diagnostics, labs, IV antibiotics -Patient is open to hospice services support  Patient's prognosis is likely less than 3 months with his shift to full comfort, continued failure to thrive, elevated liver enzymes, decreased albumin 1.9, Biliruben is 21.8, creatinine is 0.52 and calculated meld score reflects a likely 9-month mortality.  The patient had been living at his mother's one-bedroom apartment prior to this admission and today she sadly has to refuse to allow him to come back to her home.  He was sleeping on an air mattress on the floor, his nursing care needs are too great for her and she has her own medical conditions to deal with.  The patient's father too is unable to care for him in his home.  This creates a disposition issue in that the patient has no home of his own and now is basically homeless.  I  discussed the situation with both Dr. Elvera Lennox  and Doristine Counter LCSW.   The patient is high risk for rapid decompensation.  Depending on disposition options the patient may be eligible for a residential hospice before alternate placement is found. Patient is open to residential hospice when he is eligible.  Family support his decision for a full comfort path.    I let the patient and family know that this nurse practitioner would not be in the hospital again until Monday morning and to call the team phone with any questions or concerns. Palliative medicine team will shadow for needs.  Total time spent on the unit was 75 minutes  Greater than 50% of the time was spent in counseling and coordination of care  Lorinda Creed NP  Palliative Medicine Team Team Phone # [phone] Pager [phone]

## 2019-03-12 NOTE — Progress Notes (Signed)
PROGRESS NOTE  Nicholas Price ZOX:[id number] DOB: 21-Jan-1971 DOA: 03/07/2019 PCP: Lorre Munroe, NP   LOS: 5 days   Brief Narrative / Interim history: 48 year old gentleman with prior history of hypertension alcohol abuse depression, GERD, liver cirrhosis secondary to alcohol abuse, paroxysmal atrial fibrillation, anxiety presents today for generalized weakness and some shortness of breath. Patient was found to be in decompensated liver failure . GI consulted and recommendations given.   Subjective: No significant complaints this morning, feels overall fatigued but able to ambulate a little bit in the room  Assessment & Plan: Active Problems:   Hypertension   Alcohol abuse   Alcohol withdrawal (HCC)   GERD (gastroesophageal reflux disease)   Hepatic failure (HCC)   Hyponatremia   Alcoholic cirrhosis of liver with ascites (HCC)   Elevated lipase   Elevated INR   Thrombocytopenia (HCC)   Anasarca   Hepatic encephalopathy (HCC)   Melena   Hyperbilirubinemia   Alcoholic hepatitis with ascites   Palliative care by specialist   DNR (do not resuscitate)   Jaundice   Principal Problem Acute alcoholic hepatitis in the setting of underlying alcohol induced cirrhosis -GI consulted and following, patient placed on prednisolone,  -Patient placed on prednisolone, continue for 28 days -LFTs are still elevated but overall stable -We will need an EGD as an outpatient -Patient placed on diuretics, lactulose, continue  Active Problems Goals of care -Palliative care was consulted and followed patient while in the hospital.  After discussion between palliative care and the patient on 3/19, he elected to pursue a comfort based approach, palliative discontinue nonessential medications including steroids, lactulose, if patient deteriorates will switch to comfort care -Disposition is also difficult as patient's mother and father are unable to care for him so essentially he is homeless  Fluid  overload/edema -Likely in the setting of liver disease, patient was started on diuretics, will need Lasix and spironolactone on discharge -Ultrasound of the abdomen without any significant ascites to be tapped  Melena with concern for GI bleed -Hemoglobin overall stable, will need EGD as an outpatient if patient chooses to for now focusing on comfort  Coagulopathy, thrombocytopenia -In the setting of liver disease  Alcohol abuse -Patient determined to quit  Paroxysmal A. fib -Not a candidate for anticoagulation  Scheduled Meds: . escitalopram  20 mg Oral Daily  . folic acid  1 mg Oral Daily  . furosemide  40 mg Intravenous Q12H  . multivitamin with minerals  1 tablet Oral Daily  . pantoprazole  40 mg Oral Daily  . sodium chloride flush  3 mL Intravenous Q12H  . spironolactone  25 mg Oral Daily  . thiamine  100 mg Oral Daily   Or  . thiamine  100 mg Intravenous Daily   Continuous Infusions: . sodium chloride     PRN Meds:.sodium chloride, hydrOXYzine, LORazepam, ondansetron **OR** ondansetron (ZOFRAN) IV, sodium chloride flush, traZODone  DVT prophylaxis: SCDs Code Status: DNR Family Communication: No family at bedside Disposition Plan: TBD  Consultants:   Gastroenterology  Procedures:   None   Antimicrobials:  None   Objective: Vitals:   03/11/19 1447 03/11/19 2036 03/12/19 0533 03/12/19 0816  BP: (!) 138/98 (!) 143/97 137/87 (!) 145/102  Pulse:  (!) 101 98 (!) 105  Resp:  Temp:  98.2 F (36.8 C) 98.7 F (37.1 C)   TempSrc:  Oral Oral Oral  SpO2:  92% 100% 90%  Weight:      Height:  Intake/Output Summary (Last 24 hours) at 03/12/2019 1139 Last data filed at 03/12/2019 0655 Gross per 24 hour  Intake 120 ml  Output 1600 ml  Net -1480 ml   Filed Weights   Mar 20, 2019 1544 Mar 20, 2019 2316  Weight: (!) 147.4 kg (!) 142.7 kg    Examination:  Constitutional: NAD Respiratory: CTA Cardiovascular: RRR  Data Reviewed: I have  independently reviewed following labs and imaging studies    CBC: Recent Labs  Lab 03-20-19 1543 03/08/19 0551 03/09/19 1022 03/12/19 0536  WBC 5.0 3.9* 5.5 6.2  NEUTROABS 3.5  --  4.2  --   HGB 17.3* 16.4 17.0 17.8*  HCT 48.8 47.9 51.4 51.3  MCV 99.0 101.9* 104.3* 103.4*  PLT 73* 79* 84* 74*   Basic Metabolic Panel: Recent Labs  Lab 03-20-2019 2124 03/08/19 0551 03/09/19 0533 03/10/19 0549 03/11/19 0538 03/12/19 0536  NA  --  134* 136 138 139 139  K  --  3.6 3.2* 3.3* 3.5 3.4*  CL  --  92* 94* 96* 98 96*  CO2  --  29 30 31 31 31   GLUCOSE  --  112* 108* 91 117* 104*  BUN  --  14 18 18 20  21*  CREATININE  --  0.54* 0.42* 0.52* 0.65 0.52*  CALCIUM  --  8.5* 8.7* 8.5* 8.6* 8.5*  MG 2.1 2.3  --   --   --   --   PHOS 2.9 2.7  --   --   --   --    GFR: Estimated Creatinine Clearance: 173.6 mL/min (A) (by C-G formula based on SCr of 0.52 mg/dL (L)). Liver Function Tests: Recent Labs  Lab 03/08/19 0551 03/09/19 0533 03/10/19 0549 03/11/19 0538 03/12/19 0536  AST 284* 263* 237* 253* 258*  ALT 99* 99* 93* 108* 119*  ALKPHOS 95 99 95 111 112  BILITOT 21.8* 22.9* 20.4* 21.7* 21.8*  PROT 5.9* 6.3* 6.0* 6.2* 6.2*  ALBUMIN 2.1* 2.1* 2.0* 2.0* 1.9*   Recent Labs  Lab 03/20/2019 1543 03/08/19 1108  LIPASE 165* 158*   Recent Labs  Lab 03-20-19 1543 03/08/19 0551  AMMONIA 90* 75*   Coagulation Profile: Recent Labs  Lab Mar 20, 2019 1537 03/08/19 0551 03/12/19 0536  INR 2.3* 2.5* 2.1*   Cardiac Enzymes: No results for input(s): CKTOTAL, CKMB, CKMBINDEX, TROPONINI in the last 168 hours. BNP (last 3 results) No results for input(s): PROBNP in the last 8760 hours. HbA1C: No results for input(s): HGBA1C in the last 72 hours. CBG: No results for input(s): GLUCAP in the last 168 hours. Lipid Profile: No results for input(s): CHOL, HDL, LDLCALC, TRIG, CHOLHDL, LDLDIRECT in the last 72 hours. Thyroid Function Tests: No results for input(s): TSH, T4TOTAL, FREET4,  T3FREE, THYROIDAB in the last 72 hours. Anemia Panel: No results for input(s): VITAMINB12, FOLATE, FERRITIN, TIBC, IRON, RETICCTPCT in the last 72 hours. Urine analysis:    Component Value Date/Time   COLORURINE BROWN (A) 03-20-2019 1546   APPEARANCEUR CLOUDY (A) Mar 20, 2019 1546   LABSPEC  03/20/19 1546    TEST NOT REPORTED DUE TO COLOR INTERFERENCE OF URINE PIGMENT   PHURINE  20-Mar-2019 1546    TEST NOT REPORTED DUE TO COLOR INTERFERENCE OF URINE PIGMENT   GLUCOSEU (A) Mar 20, 2019 1546    TEST NOT REPORTED DUE TO COLOR INTERFERENCE OF URINE PIGMENT   HGBUR (A) 2019/03/20 1546    TEST NOT REPORTED DUE TO COLOR INTERFERENCE OF URINE PIGMENT   BILIRUBINUR (A) 2019/03/20 1546    TEST NOT  REPORTED DUE TO COLOR INTERFERENCE OF URINE PIGMENT   KETONESUR (A) 03/07/2019 1546    TEST NOT REPORTED DUE TO COLOR INTERFERENCE OF URINE PIGMENT   PROTEINUR (A) 03/07/2019 1546    TEST NOT REPORTED DUE TO COLOR INTERFERENCE OF URINE PIGMENT   NITRITE (A) 03/07/2019 1546    TEST NOT REPORTED DUE TO COLOR INTERFERENCE OF URINE PIGMENT   LEUKOCYTESUR (A) 03/07/2019 1546    TEST NOT REPORTED DUE TO COLOR INTERFERENCE OF URINE PIGMENT   Sepsis Labs: Invalid input(s): PROCALCITONIN, LACTICIDVEN  Recent Results (from the past 240 hour(s))  Urine culture     Status: Abnormal   Collection Time: 03/07/19  4:01 PM  Result Value Ref Range Status   Specimen Description   Final    URINE, RANDOM Performed at [facility], [address], [place], [place] [address]    Special Requests   Final    NONE Performed at [facility], [address], [place], [place] [address]    Culture (A)  Final    <10,000 COLONIES/mL INSIGNIFICANT GROWTH Performed at [facility], [address], [place], [place] [address]    Report Status 03/09/2019 FINAL  Final  Blood culture (routine x 2)     Status: None (Preliminary result)   Collection Time: 03/07/19  4:01 PM  Result  Value Ref Range Status   Specimen Description   Final    BLOOD RIGHT HAND Performed at [facility], [address] [place], [place] [address]    Special Requests   Final    BOTTLES DRAWN AEROBIC AND ANAEROBIC Blood Culture adequate volume Performed at [facility], [address] [place], [place] [address]    Culture   Final    NO GROWTH 4 DAYS Performed at [facility], [address] [place], [place] [address]    Report Status PENDING  Incomplete  Blood culture (routine x 2)     Status: None (Preliminary result)   Collection Time: 03/07/19  4:06 PM  Result Value Ref Range Status   Specimen Description   Final    BLOOD LEFT HAND Performed at [facility], [address] [place], [place] [address]    Special Requests   Final    BOTTLES DRAWN AEROBIC AND ANAEROBIC Blood Culture adequate volume Performed at [facility], [address], [place], [place] [address]    Culture   Final    NO GROWTH 4 DAYS Performed at [facility], [address] [place], [place] [address]    Report Status PENDING  Incomplete      Radiology Studies: No results found.  Pamella Pert, MD, PhD [facility]  Contact via  [email or web address]  [facility] Office Info P: [phone]  F: [phone]

## 2019-03-12 NOTE — Progress Notes (Addendum)
PHYSICAL THERAPY  Palliative meeting in progress.  Will attempt to see another day as schedule permits.    Felecia Shelling  PTA Acute  Rehabilitation Services Pager      [phone] Office      [phone]

## 2019-03-12 NOTE — Progress Notes (Signed)
Got a phone call from palliative NP that patient will not discharge today due to he does not have any place to live now.

## 2019-03-12 NOTE — Progress Notes (Signed)
Patient has discharged to home on 03/12/2019. Discharge instruction including medication and appointment was given to patient. Patient has no question at this time. 

## 2019-03-12 NOTE — Progress Notes (Signed)
Palliative Medicine RN Note: Rec'd a call from pt's father Renae Fickle [phone]); he is requesting to speak with NP Lorinda Creed re d/c plan. Chart note from pt's floor RN states pt has been d/c already. I will follow up and call Paul back.  Spoke with the patient's nurse; pt has been d/c per MD order, but he is still in the room (he cannot ambulate independently and has no transport and nowhere to go). RN recommended I call Dr Elvera Lennox with any concerns about d/c plan.   Lorinda Creed was due to contact family at 37; I spoke with her. She is very familiar with the case and will go see the patient now. I called RN back to let him know Corrie Dandy is on the way to see [place] now.  Margret Chance Alycen Mack, RN, BSN, [facility] Palliative Medicine Team 03/12/2019 1:42 PM Office [phone]

## 2019-03-13 DIAGNOSIS — Z7189 Other specified counseling: Secondary | ICD-10-CM

## 2019-03-13 MED ORDER — LACTULOSE 10 GM/15ML PO SOLN
20.0000 g | Freq: Two times a day (BID) | ORAL | Status: DC
  Administered 2019-03-13 – 2019-03-16 (×7): 20 g via ORAL
  Filled 2019-03-13 (×7): qty 30

## 2019-03-13 MED ORDER — POTASSIUM CHLORIDE CRYS ER 20 MEQ PO TBCR
40.0000 meq | EXTENDED_RELEASE_TABLET | Freq: Once | ORAL | Status: AC
  Administered 2019-03-13: 40 meq via ORAL
  Filled 2019-03-13: qty 2

## 2019-03-13 MED ORDER — FUROSEMIDE 40 MG PO TABS
40.0000 mg | ORAL_TABLET | Freq: Every day | ORAL | Status: DC
  Administered 2019-03-13 – 2019-03-16 (×4): 40 mg via ORAL
  Filled 2019-03-13 (×4): qty 1

## 2019-03-13 MED ORDER — PREDNISOLONE 5 MG PO TABS
40.0000 mg | ORAL_TABLET | Freq: Every day | ORAL | Status: DC
  Administered 2019-03-13 – 2019-03-16 (×4): 40 mg via ORAL
  Filled 2019-03-13 (×4): qty 8

## 2019-03-13 NOTE — Progress Notes (Signed)
PROGRESS NOTE  Nicholas Price JKD:[id number] DOB: [birth year] DOA: 03/07/2019 PCP: Lorre Munroe, NP   LOS: 6 days   Brief Narrative / Interim history: 48 year old gentleman with prior history of hypertension alcohol abuse depression, GERD, liver cirrhosis secondary to alcohol abuse, paroxysmal atrial fibrillation, anxiety presents today for generalized weakness and some shortness of breath. Patient was found to be in decompensated liver failure . GI consulted and recommendations given.   Subjective: Feels quite jittery and "foggy".  No abdominal pain, no nausea or vomiting.  Also feels weak  Assessment & Plan: Active Problems:   Hypertension   Alcohol abuse   Alcohol withdrawal (HCC)   GERD (gastroesophageal reflux disease)   Hepatic failure (HCC)   Hyponatremia   Alcoholic cirrhosis of liver with ascites (HCC)   Elevated lipase   Elevated INR   Thrombocytopenia (HCC)   Anasarca   Hepatic encephalopathy (HCC)   Melena   Hyperbilirubinemia   Alcoholic hepatitis with ascites   Palliative care by specialist   DNR (do not resuscitate)   Jaundice   Ascites due to alcoholic cirrhosis (HCC)   Weakness generalized   Principal Problem Acute alcoholic hepatitis in the setting of underlying alcohol induced cirrhosis -GI consulted and following, patient placed on prednisolone,  -Patient placed on prednisolone, continue for 28 days -LFTs are still elevated but overall stable -We will need an EGD as an outpatient but patient is unsure that he wishes for that and once he is cared to be cured a little bit more towards comfort -Patient placed on diuretics, lactulose, continue -Palliative discussed with patient yesterday regarding comfort and his lactulose and prednisone were discontinued, however that may be also a comfort based approach given him mental clarity and will continue  Active Problems Goals of care -Palliative care was consulted and followed patient while in the hospital.    -Likely he will be enrolled in hospice upon discharge, for now placement is an issue as he cannot live with his mother and father and has no place to go  Fluid overload/edema -Likely in the setting of liver disease, patient was started on diuretics, will need Lasix and spironolactone on discharge -Ultrasound of the abdomen without any significant ascites to be tapped  Melena with concern for GI bleed -Hemoglobin overall stable, will need EGD as an outpatient if patient chooses to, for now focusing on comfort  Coagulopathy, thrombocytopenia -In the setting of liver disease, repeat labs tomorrow  Alcohol abuse -Patient determined to quit  Paroxysmal A. fib -Not a candidate for anticoagulation  Scheduled Meds: . escitalopram  20 mg Oral Daily  . folic acid  1 mg Oral Daily  . furosemide  40 mg Oral Daily  . lactulose  20 g Oral BID  . multivitamin with minerals  1 tablet Oral Daily  . pantoprazole  40 mg Oral Daily  . potassium chloride  40 mEq Oral Once  . sodium chloride flush  3 mL Intravenous Q12H  . spironolactone  25 mg Oral Daily  . thiamine  100 mg Oral Daily   Or  . thiamine  100 mg Intravenous Daily   Continuous Infusions: . sodium chloride     PRN Meds:.sodium chloride, hydrOXYzine, LORazepam, ondansetron **OR** ondansetron (ZOFRAN) IV, sodium chloride flush, traZODone  DVT prophylaxis: SCDs Code Status: DNR Family Communication: No family at bedside Disposition Plan: TBD  Consultants:   Gastroenterology  Procedures:   None   Antimicrobials:  None   Objective: Vitals:   03/12/19 2040 03/13/19 0511  03/13/19 0515 03/13/19 0619  BP: (!) 126/98 (!) 137/97  129/77  Pulse: (!) 102 93  85  Resp: Temp: 97.8 F (36.6 C) 97.8 F (36.6 C)  98.2 F (36.8 C)  TempSrc:      SpO2:  (!) 83% 93% 96%  Weight:      Height:        Intake/Output Summary (Last 24 hours) at 03/13/2019 1048 Last data filed at 03/13/2019 0629 Gross per 24 hour  Intake  1080 ml  Output 1300 ml  Net -220 ml   Filed Weights   09-Mar-2019 1544 09-Mar-2019 2316  Weight: (!) 147.4 kg (!) 142.7 kg    Examination:  Constitutional: NAD Respiratory: CTA Cardiovascular: RRR  Data Reviewed: I have independently reviewed following labs and imaging studies    CBC: Recent Labs  Lab 03-09-2019 1543 03/08/19 0551 03/09/19 1022 03/12/19 0536  WBC 5.0 3.9* 5.5 6.2  NEUTROABS 3.5  --  4.2  --   HGB 17.3* 16.4 17.0 17.8*  HCT 48.8 47.9 51.4 51.3  MCV 99.0 101.9* 104.3* 103.4*  PLT 73* 79* 84* 74*   Basic Metabolic Panel: Recent Labs  Lab 03/09/2019 2124 03/08/19 0551 03/09/19 0533 03/10/19 0549 03/11/19 0538 03/12/19 0536  NA  --  134* 136 138 139 139  K  --  3.6 3.2* 3.3* 3.5 3.4*  CL  --  92* 94* 96* 98 96*  CO2  --  GLUCOSE  --  112* 108* 91 117* 104*  BUN  --  21*  CREATININE  --  0.54* 0.42* 0.52* 0.65 0.52*  CALCIUM  --  8.5* 8.7* 8.5* 8.6* 8.5*  MG 2.1 2.3  --   --   --   --   PHOS 2.9 2.7  --   --   --   --    GFR: Estimated Creatinine Clearance: 173.6 mL/min (A) (by C-G formula based on SCr of 0.52 mg/dL (L)). Liver Function Tests: Recent Labs  Lab 03/08/19 0551 03/09/19 0533 03/10/19 0549 03/11/19 0538 03/12/19 0536  AST 284* 263* 237* 253* 258*  ALT 99* 99* 93* 108* 119*  ALKPHOS 95 99 95 111 112  BILITOT 21.8* 22.9* 20.4* 21.7* 21.8*  PROT 5.9* 6.3* 6.0* 6.2* 6.2*  ALBUMIN 2.1* 2.1* 2.0* 2.0* 1.9*   Recent Labs  Lab 09-Mar-2019 1543 03/08/19 1108  LIPASE 165* 158*   Recent Labs  Lab 2019-03-09 1543 03/08/19 0551  AMMONIA 90* 75*   Coagulation Profile: Recent Labs  Lab 2019/03/09 1537 03/08/19 0551 03/12/19 0536  INR 2.3* 2.5* 2.1*   Cardiac Enzymes: No results for input(s): CKTOTAL, CKMB, CKMBINDEX, TROPONINI in the last 168 hours. BNP (last 3 results) No results for input(s): PROBNP in the last 8760 hours. HbA1C: No results for input(s): HGBA1C in the last 72 hours. CBG: No results  for input(s): GLUCAP in the last 168 hours. Lipid Profile: No results for input(s): CHOL, HDL, LDLCALC, TRIG, CHOLHDL, LDLDIRECT in the last 72 hours. Thyroid Function Tests: No results for input(s): TSH, T4TOTAL, FREET4, T3FREE, THYROIDAB in the last 72 hours. Anemia Panel: No results for input(s): VITAMINB12, FOLATE, FERRITIN, TIBC, IRON, RETICCTPCT in the last 72 hours. Urine analysis:    Component Value Date/Time   COLORURINE BROWN (A) 03-09-19 1546   APPEARANCEUR CLOUDY (A) 2019-03-09 1546   LABSPEC  March 09, 2019 1546    TEST NOT REPORTED DUE TO COLOR INTERFERENCE OF URINE PIGMENT  PHURINE  03/07/2019 1546    TEST NOT REPORTED DUE TO COLOR INTERFERENCE OF URINE PIGMENT   GLUCOSEU (A) 03/07/2019 1546    TEST NOT REPORTED DUE TO COLOR INTERFERENCE OF URINE PIGMENT   HGBUR (A) 03/07/2019 1546    TEST NOT REPORTED DUE TO COLOR INTERFERENCE OF URINE PIGMENT   BILIRUBINUR (A) 03/07/2019 1546    TEST NOT REPORTED DUE TO COLOR INTERFERENCE OF URINE PIGMENT   KETONESUR (A) 03/07/2019 1546    TEST NOT REPORTED DUE TO COLOR INTERFERENCE OF URINE PIGMENT   PROTEINUR (A) 03/07/2019 1546    TEST NOT REPORTED DUE TO COLOR INTERFERENCE OF URINE PIGMENT   NITRITE (A) 03/07/2019 1546    TEST NOT REPORTED DUE TO COLOR INTERFERENCE OF URINE PIGMENT   LEUKOCYTESUR (A) 03/07/2019 1546    TEST NOT REPORTED DUE TO COLOR INTERFERENCE OF URINE PIGMENT   Sepsis Labs: Invalid input(s): PROCALCITONIN, LACTICIDVEN  Recent Results (from the past 240 hour(s))  Urine culture     Status: Abnormal   Collection Time: 03/07/19  4:01 PM  Result Value Ref Range Status   Specimen Description   Final    URINE, RANDOM Performed at [facility], [address], [place], [place] [address]    Special Requests   Final    NONE Performed at [facility], [address], [place], [place] [address]    Culture (A)  Final    <10,000 COLONIES/mL INSIGNIFICANT GROWTH Performed  at [facility], [address] [place], [place] [address]    Report Status 03/09/2019 FINAL  Final  Blood culture (routine x 2)     Status: None   Collection Time: 03/07/19  4:01 PM  Result Value Ref Range Status   Specimen Description   Final    BLOOD RIGHT HAND Performed at [facility], [address] [place], [place] [address]    Special Requests   Final    BOTTLES DRAWN AEROBIC AND ANAEROBIC Blood Culture adequate volume Performed at [facility], [address] [place], [place] [address]    Culture   Final    NO GROWTH 5 DAYS Performed at [facility], [address] [place], [place] [address]    Report Status 03/12/2019 FINAL  Final  Blood culture (routine x 2)     Status: None   Collection Time: 03/07/19  4:06 PM  Result Value Ref Range Status   Specimen Description   Final    BLOOD LEFT HAND Performed at [facility], [address], [place], [place] [address]    Special Requests   Final    BOTTLES DRAWN AEROBIC AND ANAEROBIC Blood Culture adequate volume Performed at [facility], [address] [place], [place] [address]    Culture   Final    NO GROWTH 5 DAYS Performed at [facility], [address], [place], [place] [address]    Report Status 03/12/2019 FINAL  Final      Radiology Studies: No results found.  Pamella Pert, MD, PhD [facility]  Contact via  [email or web address]  [facility] Office Info P: [phone]  F: [phone]

## 2019-03-13 NOTE — Progress Notes (Signed)
Daily Progress Note   Patient Name: Nicholas Price       Date: 03/13/2019 DOB: Jul 24, 1971  Age: 48 y.o. MRN#: [id number] Attending Physician: Leatha Gilding, MD Primary Care Physician: Lorre Munroe, NP Admit Date: 03/07/2019  Reason for Consultation/Follow-up: Establishing goals of care  Subjective:  patient is resting in bed, he states that he feels jittery, has asterixis. He is eating ok. He complains of generalized weakness, having difficulty and needing a lot of assistance with most all ADLs.   Length of Stay: 6  Current Medications: Scheduled Meds:  . escitalopram  20 mg Oral Daily  . folic acid  1 mg Oral Daily  . furosemide  40 mg Oral Daily  . lactulose  20 g Oral BID  . multivitamin with minerals  1 tablet Oral Daily  . pantoprazole  40 mg Oral Daily  . potassium chloride  40 mEq Oral Once  . prednisoLONE  40 mg Oral Daily  . sodium chloride flush  3 mL Intravenous Q12H  . spironolactone  25 mg Oral Daily  . thiamine  100 mg Oral Daily   Or  . thiamine  100 mg Intravenous Daily    Continuous Infusions: . sodium chloride      PRN Meds: sodium chloride, hydrOXYzine, LORazepam, ondansetron **OR** ondansetron (ZOFRAN) IV, sodium chloride flush, traZODone  Physical Exam         Icteric appearing Appears flat affect No distress Appears chronically ill Clear S1 S2 Abdomen is distended Has trace edema asterixis +  Vital Signs: BP 129/77   Pulse 85   Temp 98.2 F (36.8 C)   Resp 20   Ht 6\' 2"  (1.88 m)   Wt (!) 142.7 kg   SpO2 96%   BMI 40.39 kg/m  SpO2: SpO2: 96 % O2 Device: O2 Device: Nasal Cannula O2 Flow Rate: O2 Flow Rate (L/min): 1 L/min  Intake/output summary:   Intake/Output Summary (Last 24 hours) at 03/13/2019 1150 Last data filed at  03/13/2019 6734 Gross per 24 hour  Intake 1080 ml  Output 1300 ml  Net -220 ml   LBM: Last BM Date: 03/12/19 Baseline Weight: Weight: (!) 147.4 kg Most recent weight: Weight: (!) 142.7 kg       Palliative Assessment/Data:    Flowsheet Rows     Most Recent  Value  Intake Tab  Referral Department  Hospitalist  Unit at Time of Referral  Med/Surg Unit  Date Notified  03/09/19  Palliative Care Type  New Palliative care  Reason for referral  Clarify Goals of Care  Date of Admission  03/07/19  Date first seen by Palliative Care  03/10/19  # of days Palliative referral response time  1 Day(s)  # of days IP prior to Palliative referral  2  Clinical Assessment  Psychosocial & Spiritual Assessment  Palliative Care Outcomes      Patient Active Problem List   Diagnosis Date Noted  . Ascites due to alcoholic cirrhosis (HCC)   . Weakness generalized   . Jaundice   . Alcoholic hepatitis with ascites   . Palliative care by specialist   . DNR (do not resuscitate)   . Hyperbilirubinemia   . Hepatic failure (HCC) 03/07/2019  . Hyponatremia 03/07/2019  . Alcoholic cirrhosis of liver with ascites (HCC) 03/07/2019  . Elevated lipase 03/07/2019  . Elevated INR 03/07/2019  . Thrombocytopenia (HCC) 03/07/2019  . Anasarca 03/07/2019  . Hepatic encephalopathy (HCC) 03/07/2019  . Melena 03/07/2019  . Alcohol dependence with withdrawal, uncomplicated (HCC) 12/14/2018  . Major depressive disorder, recurrent, severe without psychotic features (HCC) 12/14/2018  . Major depressive disorder, recurrent severe without psychotic features (HCC) 12/14/2018  . Alcohol abuse with alcohol-induced mood disorder (HCC) 12/11/2017  . Alcohol abuse 11/26/2017  . Alcohol abuse with intoxication (HCC) 11/26/2017  . Alcohol withdrawal (HCC) 11/26/2017  . GERD (gastroesophageal reflux disease) 11/26/2017  . Abnormal LFTs 11/26/2017  . Abdominal pain 11/26/2017  . Insomnia 07/07/2015  . Anxiety and depression    . Hypertension 07/10/2012    Palliative Care Assessment & Plan   Patient Profile:    Assessment:  ETOH use Depression GERD Liver cirrhosis due to ETOH Anxiety Paroxysmal A fib Decompensated liver failure MELD score is 26 points, with a 20% 3 month mortality risk.   Recommendations/Plan:   discussed with patient about his disposition options, he states that neither his mom nor his dad are able to take him in. He plans on reaching out to his ex wife who lives in [place], [place]. She has 3 kids of her own, he doesn't have kids. Acknowledged the patient's stressful situation given his disposition plans, offered active listening and supportive care.   Continue current mode of care  Discussed with TRH MD and with CSW, patient may potentially have some life prolongation if he abstains and remains compliant with his medications. At this time, doesn't appear to be residential hospice appropriate, in my opinion.    Code Status:    Code Status Orders  (From admission, onward)         Start     Ordered   03/07/19 2317  Do not attempt resuscitation (DNR)  Continuous    Question Answer Comment  In the event of cardiac or respiratory ARREST Do not call a "code blue"   In the event of cardiac or respiratory ARREST Do not perform Intubation, CPR, defibrillation or ACLS   In the event of cardiac or respiratory ARREST Use medication by any route, position, wound care, and other measures to relive pain and suffering. May use oxygen, suction and manual treatment of airway obstruction as needed for comfort.      03/07/19 2316        Code Status History    Date Active Date Inactive Code Status Order ID Comments User Context   12/14/2018 1155  12/19/2018 1902 Full Code [id number]  Charm Rings, NP Inpatient   12/14/2018 0131 12/14/2018 1047 Full Code [id number]  Melene Plan, DO ED   12/01/2018 0412 12/02/2018 0339 Full Code [id number]  Glynn Octave, MD ED   03/25/2018 2022 03/26/2018 1609  Full Code [id number]  [name], MD ED   01/20/2018 1848 01/21/2018 1352 Full Code [id number]  Cathren Laine, MD ED   12/10/2017 1420 12/11/2017 2010 Full Code [id number]  Pisciotta, Mardella Layman ED   11/26/2017 2051 11/29/2017 1616 Full Code [id number]  Lorretta Harp, MD ED   05/20/2015 0213 05/21/2015 1340 Full Code [id number]  Lorretta Harp, MD ED   07/09/2012 2236 07/10/2012 2233 Full Code [id number]  Hosmer, Faylene Million, MD ED       Prognosis:   guarded   Discharge Planning:  To Be Determined  Care plan was discussed with patient CSW and [facility] MD  Thank you for allowing the Palliative Medicine Team to assist in the care of this patient.   Time In: 11 Time Out: 11.35 Total Time 35 Prolonged Time Billed  no       Greater than 50%  of this time was spent counseling and coordinating care related to the above assessment and plan.  Rosalin Hawking, MD [phone] Please contact Palliative Medicine Team phone at [phone] for questions and concerns.

## 2019-03-13 NOTE — TOC Initial Note (Signed)
Transition of Care [facility]) - Initial/Assessment Note    Patient Details  Name: Nicholas Price MRN: [id number] Date of Birth: 11-01-1971  [facility]) CM/SW Contact:    Coralyn Helling, LCSW Phone Number: 03/13/2019, 3:24 PM  Clinical Narrative:                 LCSW consulted for homeless issues. Patient according to palliative patient has poor prognosis. Patient is agreeable to comfort care, but is not a candidate for residential hospice at this point. Patient is agreeable to home with hospice. Patient does not have permanent housing, however his spouse, Nicholas Price whom he is separated from is agreeable to take him in with home hospice.   Nicholas Price would like to set up HCPOA and medicaid application prior to patient dc. Nicholas Price also asked about disability. Due to the visitor restrictions we can not have someone come in to complete the application. LCSW discussed pt applying on line or asking for assistance from the hospice social worker.   Patient will need hospital bed at dc.   Expected Discharge Plan: Home w Hospice Care Barriers to Discharge: Continued Medical Work up   Patient Goals and CMS Choice Patient states their goals for this hospitalization and ongoing recovery are:: stable housing and comfort care.      Expected Discharge Plan and Services Expected Discharge Plan: Home w Hospice Care In-house Referral: Chaplain Discharge Planning Services: Other - See comment(Home with hospice) Post Acute Care Choice: Hospice Living arrangements for the past 2 months: No permanent address Expected Discharge Date: 03/12/19                     [facility] Agency: [facility] [place]  Prior Living Arrangements/Services Living arrangements for the past 2 months: No permanent address Lives with:: Self Patient language and need for interpreter reviewed:: No Do you feel safe going back to the place where you live?: No(No address)   No address  Need for Family  Participation in Patient Care: Yes (Comment) Care giver support system in place?: Yes (comment)   Criminal Activity/Legal Involvement Pertinent to Current Situation/Hospitalization: No - Comment as needed  Activities of Daily Living Home Assistive Devices/Equipment: Eyeglasses ADL Screening (condition at time of admission) Patient's cognitive ability adequate to safely complete daily activities?: Yes Is the patient deaf or have difficulty hearing?: No Does the patient have difficulty seeing, even when wearing glasses/contacts?: No Does the patient have difficulty concentrating, remembering, or making decisions?: No Patient able to express need for assistance with ADLs?: Yes Does the patient have difficulty dressing or bathing?: No Independently performs ADLs?: Yes (appropriate for developmental age) Does the patient have difficulty walking or climbing stairs?: No Weakness of Legs: None Weakness of Arms/Hands: None  Permission Sought/Granted Permission sought to share information with : Facility Medical sales representative, Family Supports Permission granted to share information with : Yes, Verbal Permission Granted  Share Information with NAME: Nicholas Price  Permission granted to share info w AGENCY: Hospice  Permission granted to share info w Relationship: spouse     Emotional Assessment Appearance:: Appears stated age Attitude/Demeanor/Rapport: Engaged Affect (typically observed): Calm, Accepting Orientation: : Oriented to Self, Oriented to Place, Oriented to  Time, Oriented to Situation Alcohol / Substance Use: Alcohol Use Psych Involvement: No (comment)  Admission diagnosis:  Hepatic encephalopathy (HCC) [K72.90] Hyperbilirubinemia [E80.6] Alcohol abuse [F10.10] Jaundice [R17] Thrombocytopenia (HCC) [D69.6] Pain [R52] Transaminitis [R74.0] Ascites [R18.8] Acute liver failure without hepatic coma [K72.00] Hepatic failure (HCC) [  K72.90] Patient Active Problem List   Diagnosis  Date Noted  . Goals of care, counseling/discussion   . Ascites due to alcoholic cirrhosis (HCC)   . Weakness generalized   . Jaundice   . Alcoholic hepatitis with ascites   . Palliative care by specialist   . DNR (do not resuscitate)   . Hyperbilirubinemia   . Hepatic failure (HCC) 03/07/2019  . Hyponatremia 03/07/2019  . Alcoholic cirrhosis of liver with ascites (HCC) 03/07/2019  . Elevated lipase 03/07/2019  . Elevated INR 03/07/2019  . Thrombocytopenia (HCC) 03/07/2019  . Anasarca 03/07/2019  . Hepatic encephalopathy (HCC) 03/07/2019  . Melena 03/07/2019  . Alcohol dependence with withdrawal, uncomplicated (HCC) 12/14/2018  . Major depressive disorder, recurrent, severe without psychotic features (HCC) 12/14/2018  . Major depressive disorder, recurrent severe without psychotic features (HCC) 12/14/2018  . Alcohol abuse with alcohol-induced mood disorder (HCC) 12/11/2017  . Alcohol abuse 11/26/2017  . Alcohol abuse with intoxication (HCC) 11/26/2017  . Alcohol withdrawal (HCC) 11/26/2017  . GERD (gastroesophageal reflux disease) 11/26/2017  . Abnormal LFTs 11/26/2017  . Abdominal pain 11/26/2017  . Insomnia 07/07/2015  . Anxiety and depression   . Hypertension 07/10/2012   PCP:  Lorre Munroe, NP Pharmacy:   [facility] [address] [place] - [address] [place] [address] Phone: [phone] Fax: [phone]     Social Determinants of Health (SDOH) Interventions    Readmission Risk Interventions Readmission Risk Prevention Plan 03/13/2019  Transportation Screening Complete  PCP or Specialist Appt within 5-7 Days Complete  Home Care Screening Complete  Medication Review (RN CM) Not Complete  Some recent data might be hidden

## 2019-03-13 NOTE — Progress Notes (Signed)
   03/13/19 1600  Clinical Encounter Type  Visited With Patient  Visit Type Initial;Psychological support;Spiritual support  Referral From Social work  Consult/Referral To Orthoptist  Spiritual Encounters  Spiritual Needs Brochure;Emotional;Other (Comment) (Spiritual Care Conversation/Support)  Stress Factors  Patient Stress Factors Health changes;Major life changes  Advance Directives (For Healthcare)  Does Patient Have a Medical Advance Directive? No  Would patient like information on creating a medical advance directive? Yes (Inpatient - patient requests chaplain consult to create a medical advance directive) Journalist, newspaper Spoke with Patient )  Mental Health Advance Directives  Does Patient Have a Mental Health Advance Directive? No  Would patient like information on creating a mental health advance directive? No - Patient declined   I spoke with Mr. Majid per referral from the social worker to discuss and Advance Directive with the him.  Mr. Ishimoto stated that he wants his soon-to-be ex-wife, [name], to make his healthcare decisions in the event that he cannot make them himself. We discussed completing the documents on Monday morning.   Please, contact Spiritual Care for further assistance.   Chaplain Clint Bolder M.Div., [facility]

## 2019-03-14 LAB — COMPREHENSIVE METABOLIC PANEL
ALK PHOS: 104 U/L (ref 38–126)
ALT: 154 U/L — ABNORMAL HIGH (ref 0–44)
AST: 273 U/L — ABNORMAL HIGH (ref 15–41)
Albumin: 2 g/dL — ABNORMAL LOW (ref 3.5–5.0)
Anion gap: 10 (ref 5–15)
BILIRUBIN TOTAL: 23.1 mg/dL — AB (ref 0.3–1.2)
BUN: 25 mg/dL — ABNORMAL HIGH (ref 6–20)
CO2: 33 mmol/L — ABNORMAL HIGH (ref 22–32)
Calcium: 8.9 mg/dL (ref 8.9–10.3)
Chloride: 95 mmol/L — ABNORMAL LOW (ref 98–111)
Creatinine, Ser: 0.35 mg/dL — ABNORMAL LOW (ref 0.61–1.24)
GFR calc Af Amer: >60 mL/min (ref >60)
GFR calc non Af Amer: >60 mL/min (ref >60)
Glucose, Bld: 97 mg/dL (ref 70–99)
Potassium: 3.7 mmol/L (ref 3.5–5.1)
Sodium: 138 mmol/L (ref 135–145)
TOTAL PROTEIN: 6.3 g/dL — AB (ref 6.5–8.1)

## 2019-03-14 LAB — CBC
HEMATOCRIT: 52.3 % — AB (ref 39.0–52.0)
Hemoglobin: 18.2 g/dL — ABNORMAL HIGH (ref 13.0–17.0)
MCH: 35.5 pg — ABNORMAL HIGH (ref 26.0–34.0)
MCHC: 34.8 g/dL (ref 30.0–36.0)
MCV: 102.1 fL — ABNORMAL HIGH (ref 80.0–100.0)
Platelets: 74 10*3/uL — ABNORMAL LOW (ref 150–400)
RBC: 5.12 MIL/uL (ref 4.22–5.81)
RDW: 20 % — ABNORMAL HIGH (ref 11.5–15.5)
WBC: 8.6 10*3/uL (ref 4.0–10.5)
nRBC: 0 % (ref 0.0–0.2)

## 2019-03-14 MED ORDER — HYDROXYZINE HCL 25 MG PO TABS
25.0000 mg | ORAL_TABLET | Freq: Three times a day (TID) | ORAL | Status: DC | PRN
  Administered 2019-03-14 – 2019-03-15 (×2): 25 mg via ORAL
  Filled 2019-03-14 (×2): qty 1

## 2019-03-14 MED ORDER — LORAZEPAM 1 MG PO TABS
1.0000 mg | ORAL_TABLET | ORAL | Status: DC | PRN
  Administered 2019-03-14 – 2019-03-15 (×2): 1 mg via ORAL
  Filled 2019-03-14 (×2): qty 1

## 2019-03-14 NOTE — Progress Notes (Signed)
0900  Assessment complete.  Resting in bed.  Alert and oriented.  Skin jaundiced.    1009  Requested ativan for agitation.  CIWA 5

## 2019-03-14 NOTE — Progress Notes (Signed)
Daily Progress Note   Patient Name: Nicholas Price       Date: 03/14/2019 DOB: September 07, 1971  Age: 48 y.o. MRN#: [id number] Attending Physician: Leatha Gilding, MD Primary Care Physician: Lorre Munroe, NP Admit Date: 03/07/2019  Reason for Consultation/Follow-up: Establishing goals of care  Subjective:  patient is resting in bed, he denies any pain. Ate a little bit.     Length of Stay: 7  Current Medications: Scheduled Meds:  . escitalopram  20 mg Oral Daily  . folic acid  1 mg Oral Daily  . furosemide  40 mg Oral Daily  . lactulose  20 g Oral BID  . multivitamin with minerals  1 tablet Oral Daily  . pantoprazole  40 mg Oral Daily  . prednisoLONE  40 mg Oral Daily  . sodium chloride flush  3 mL Intravenous Q12H  . spironolactone  25 mg Oral Daily  . thiamine  100 mg Oral Daily   Or  . thiamine  100 mg Intravenous Daily    Continuous Infusions: . sodium chloride      PRN Meds: sodium chloride, hydrOXYzine, LORazepam, ondansetron **OR** ondansetron (ZOFRAN) IV, sodium chloride flush, traZODone  Physical Exam         Icteric appearing Appears to have flat affect No distress Appears chronically ill Clear S1 S2 Abdomen is distended Has trace edema asterixis +  Vital Signs: BP (!) 136/104 (BP Location: Right Arm)   Pulse (!) 105   Temp 98.6 F (37 C) (Oral)   Resp 20   Ht  (1.88 m)   Wt (!) 142.7 kg   SpO2 94%   BMI 40.39 kg/m  SpO2: SpO2: 94 % O2 Device: O2 Device: Nasal Cannula O2 Flow Rate: O2 Flow Rate (L/min): 2 L/min  Intake/output summary:   Intake/Output Summary (Last 24 hours) at 03/14/2019 1238 Last data filed at 03/14/2019 0900 Gross per 24 hour  Intake 480 ml  Output 1000 ml  Net -520 ml   LBM: Last BM Date: 03/12/19 Baseline Weight:  Weight: (!) 147.4 kg Most recent weight: Weight: (!) 142.7 kg       Palliative Assessment/Data:    Flowsheet Rows     Most Recent Value  Intake Tab  Referral Department  Hospitalist  Unit at Time of Referral  Med/Surg Unit  Date Notified  03/09/19  Palliative Care Type  New Palliative care  Reason for referral  Clarify Goals of Care  Date of Admission  03/07/19  Date first seen by Palliative Care  03/10/19  # of days Palliative referral response time  1 Day(s)  # of days IP prior to Palliative referral  2  Clinical Assessment  Psychosocial & Spiritual Assessment  Palliative Care Outcomes      Patient Active Problem List   Diagnosis Date Noted  . Goals of care, counseling/discussion   . Ascites due to alcoholic cirrhosis (HCC)   . Weakness generalized   . Jaundice   . Alcoholic hepatitis with ascites   . Palliative care by specialist   . DNR (do not resuscitate)   . Hyperbilirubinemia   . Hepatic failure (HCC) 03/07/2019  . Hyponatremia 03/07/2019  . Alcoholic cirrhosis of liver with ascites (HCC) 03/07/2019  . Elevated lipase 03/07/2019  . Elevated INR 03/07/2019  . Thrombocytopenia (HCC) 03/07/2019  . Anasarca 03/07/2019  . Hepatic encephalopathy (HCC) 03/07/2019  . Melena 03/07/2019  . Alcohol dependence with withdrawal, uncomplicated (HCC) 12/14/2018  . Major depressive disorder, recurrent, severe without psychotic features (HCC) 12/14/2018  . Major depressive disorder, recurrent severe without psychotic features (HCC) 12/14/2018  . Alcohol abuse with alcohol-induced mood disorder (HCC) 12/11/2017  . Alcohol abuse 11/26/2017  . Alcohol abuse with intoxication (HCC) 11/26/2017  . Alcohol withdrawal (HCC) 11/26/2017  . GERD (gastroesophageal reflux disease) 11/26/2017  . Abnormal LFTs 11/26/2017  . Abdominal pain 11/26/2017  . Insomnia 07/07/2015  . Anxiety and depression   . Hypertension 07/10/2012    Palliative Care Assessment & Plan   Patient Profile:     Assessment:  ETOH use Depression GERD Liver cirrhosis due to ETOH Anxiety Paroxysmal A fib Decompensated liver failure MELD score is 26 points, with a 20% 3 month mortality risk.   Recommendations/Plan:   I had discussed with the patient's wife ( they are separated) over the phone on 03-13-2019 about the patient's current condition.   Home with hospice with wife soon.  Continue current mode of care.   Code Status:    Code Status Orders  (From admission, onward)         Start     Ordered   03/07/19 2317  Do not attempt resuscitation (DNR)  Continuous    Question Answer Comment  In the event of cardiac or respiratory ARREST Do not call a "code blue"   In the event of cardiac or respiratory ARREST Do not perform Intubation, CPR, defibrillation or ACLS   In the event of cardiac or respiratory ARREST Use medication by any route, position, wound care, and other measures to relive pain and suffering. May use oxygen, suction and manual treatment of airway obstruction as needed for comfort.      03/07/19 2316        Code Status History    Date Active Date Inactive Code Status Order ID Comments User Context   12/14/2018 1155 12/19/2018 1902 Full Code [id number]  Charm Rings, NP Inpatient   12/14/2018 0131 12/14/2018 1047 Full Code [id number]  Melene Plan, DO ED   12/01/2018 0412 12/02/2018 0339 Full Code [id number]  Glynn Octave, MD ED   03/25/2018 2022 03/26/2018 1609 Full Code [id number]  [name], MD ED   01/20/2018 1848 01/21/2018 1352 Full Code [id number]  Cathren Laine, MD ED   12/10/2017 1420 12/11/2017 2010 Full Code [id number]  Pisciotta, Mardella Layman ED   11/26/2017  2051 11/29/2017 1616 Full Code [id number]  Lorretta Harp, MD ED   05/20/2015 [phone] 05/21/2015 1340 Full Code [id number]  Lorretta Harp, MD ED   07/09/2012 2236 07/10/2012 2233 Full Code [id number]  Hosmer, Faylene Million, MD ED       Prognosis:   less than 6 months   Discharge Planning:  Home with hospice  Care  plan was discussed with patient CSW   Thank you for allowing the Palliative Medicine Team to assist in the care of this patient.   Time In: 11 Time Out: 11.25 Total Time 25 Prolonged Time Billed  no       Greater than 50%  of this time was spent counseling and coordinating care related to the above assessment and plan.  Rosalin Hawking, MD [phone] Please contact Palliative Medicine Team phone at [phone] for questions and concerns.

## 2019-03-14 NOTE — Progress Notes (Signed)
PROGRESS NOTE  Nicholas Price UWT:[id number] DOB: [birth year] DOA: 03/07/2019 PCP: Lorre Munroe, NP   LOS: 7 days   Brief Narrative / Interim history: 48 year old gentleman with prior history of hypertension alcohol abuse depression, GERD, liver cirrhosis secondary to alcohol abuse, paroxysmal atrial fibrillation, anxiety presents today for generalized weakness and some shortness of breath. Patient was found to be in decompensated liver failure . GI consulted and recommendations given.   Subjective: Feels a bit better this morning, no abdominal pain, no nausea or vomiting.  Continues to feel "foggy"  Assessment & Plan: Active Problems:   Hypertension   Alcohol abuse   Alcohol withdrawal (HCC)   GERD (gastroesophageal reflux disease)   Hepatic failure (HCC)   Hyponatremia   Alcoholic cirrhosis of liver with ascites (HCC)   Elevated lipase   Elevated INR   Thrombocytopenia (HCC)   Anasarca   Hepatic encephalopathy (HCC)   Melena   Hyperbilirubinemia   Alcoholic hepatitis with ascites   Palliative care by specialist   DNR (do not resuscitate)   Jaundice   Ascites due to alcoholic cirrhosis (HCC)   Weakness generalized   Goals of care, counseling/discussion   Principal Problem Acute alcoholic hepatitis in the setting of underlying alcohol induced cirrhosis -GI consulted and following, patient placed on prednisolone,  -Patient placed on prednisolone, continue for 28 days -LFTs are still elevated but overall stable -We will need an EGD as an outpatient but patient is unsure that he wishes for that at this point -Patient placed on diuretics, lactulose, continue, these have a comfort role as well  Active Problems Goals of care -Palliative care was consulted and followed patient while in the hospital.   -Likely he will be enrolled in hospice upon discharge, for now placement is an issue as he cannot live with his mother and father and has no place to go -To be placed home with  his ex-spouse with hospice, however hospice services cannot be initiated until Monday per SW  Fluid overload/edema -Likely in the setting of liver disease, patient was started on diuretics, will need Lasix and spironolactone on discharge -Ultrasound of the abdomen without any significant ascites to be tapped  Melena with concern for GI bleed -Hemoglobin overall stable, will need EGD as an outpatient if patient chooses to, for now focusing on comfort  Coagulopathy, thrombocytopenia -In the setting of liver disease  Alcohol abuse -Patient determined to quit  Paroxysmal A. fib -Not a candidate for anticoagulation  Scheduled Meds: . escitalopram  20 mg Oral Daily  . folic acid  1 mg Oral Daily  . furosemide  40 mg Oral Daily  . lactulose  20 g Oral BID  . multivitamin with minerals  1 tablet Oral Daily  . pantoprazole  40 mg Oral Daily  . prednisoLONE  40 mg Oral Daily  . sodium chloride flush  3 mL Intravenous Q12H  . spironolactone  25 mg Oral Daily  . thiamine  100 mg Oral Daily   Or  . thiamine  100 mg Intravenous Daily   Continuous Infusions: . sodium chloride     PRN Meds:.sodium chloride, hydrOXYzine, LORazepam, ondansetron **OR** ondansetron (ZOFRAN) IV, sodium chloride flush, traZODone  DVT prophylaxis: SCDs Code Status: DNR Family Communication: No family at bedside Disposition Plan: TBD  Consultants:   Gastroenterology  Procedures:   None   Antimicrobials:  None   Objective: Vitals:   03/13/19 0619 03/13/19 1352 03/13/19 2234 03/14/19 0600  BP: 129/77 (!) 135/100 (!) 142/108 Marland Kitchen)  136/104  Pulse: 85 (!) 102 100 (!) 105  Resp: 20 18 20 20   Temp: 98.2 F (36.8 C) 98.4 F (36.9 C) 98 F (36.7 C) 98.6 F (37 C)  TempSrc:  Oral Oral Oral  SpO2: 96% 93% 94% 94%  Weight:      Height:        Intake/Output Summary (Last 24 hours) at 03/14/2019 1104 Last data filed at 03/14/2019 0900 Gross per 24 hour  Intake 480 ml  Output 1000 ml  Net -520 ml    Filed Weights   03-21-2019 1544 2019-03-21 2316  Weight: (!) 147.4 kg (!) 142.7 kg    Examination:  Constitutional: NAD Respiratory: CTA Cardiovascular: RRR  Data Reviewed: I have independently reviewed following labs and imaging studies    CBC: Recent Labs  Lab 03/21/19 1543 03/08/19 0551 03/09/19 1022 03/12/19 0536 03/14/19 0553  WBC 5.0 3.9* 5.5 6.2 8.6  NEUTROABS 3.5  --  4.2  --   --   HGB 17.3* 16.4 17.0 17.8* 18.2*  HCT 48.8 47.9 51.4 51.3 52.3*  MCV 99.0 101.9* 104.3* 103.4* 102.1*  PLT 73* 79* 84* 74* 74*   Basic Metabolic Panel: Recent Labs  Lab 03/21/19 2124 03/08/19 0551 03/09/19 0533 03/10/19 0549 03/11/19 0538 03/12/19 0536 03/14/19 0553  NA  --  134* 136 138 139 139 138  K  --  3.6 3.2* 3.3* 3.5 3.4* 3.7  CL  --  92* 94* 96* 98 96* 95*  CO2  --  29 30 31 31 31  33*  GLUCOSE  --  112* 108* 91 117* 104* 97  BUN  --  14 18 18 20  21* 25*  CREATININE  --  0.54* 0.42* 0.52* 0.65 0.52* 0.35*  CALCIUM  --  8.5* 8.7* 8.5* 8.6* 8.5* 8.9  MG 2.1 2.3  --   --   --   --   --   PHOS 2.9 2.7  --   --   --   --   --    GFR: Estimated Creatinine Clearance: 173.6 mL/min (A) (by C-G formula based on SCr of 0.35 mg/dL (L)). Liver Function Tests: Recent Labs  Lab 03/09/19 0533 03/10/19 0549 03/11/19 0538 03/12/19 0536 03/14/19 0553  AST 263* 237* 253* 258* 273*  ALT 99* 93* 108* 119* 154*  ALKPHOS 99 95 111 112 104  BILITOT 22.9* 20.4* 21.7* 21.8* 23.1*  PROT 6.3* 6.0* 6.2* 6.2* 6.3*  ALBUMIN 2.1* 2.0* 2.0* 1.9* 2.0*   Recent Labs  Lab 03/21/19 1543 03/08/19 1108  LIPASE 165* 158*   Recent Labs  Lab Mar 21, 2019 1543 03/08/19 0551  AMMONIA 90* 75*   Coagulation Profile: Recent Labs  Lab 03-21-2019 1537 03/08/19 0551 03/12/19 0536  INR 2.3* 2.5* 2.1*   Cardiac Enzymes: No results for input(s): CKTOTAL, CKMB, CKMBINDEX, TROPONINI in the last 168 hours. BNP (last 3 results) No results for input(s): PROBNP in the last 8760 hours. HbA1C: No  results for input(s): HGBA1C in the last 72 hours. CBG: No results for input(s): GLUCAP in the last 168 hours. Lipid Profile: No results for input(s): CHOL, HDL, LDLCALC, TRIG, CHOLHDL, LDLDIRECT in the last 72 hours. Thyroid Function Tests: No results for input(s): TSH, T4TOTAL, FREET4, T3FREE, THYROIDAB in the last 72 hours. Anemia Panel: No results for input(s): VITAMINB12, FOLATE, FERRITIN, TIBC, IRON, RETICCTPCT in the last 72 hours. Urine analysis:    Component Value Date/Time   COLORURINE BROWN (A) 03/21/2019 1546   APPEARANCEUR CLOUDY (A) Mar 21, 2019 1546  LABSPEC  03/07/2019 1546    TEST NOT REPORTED DUE TO COLOR INTERFERENCE OF URINE PIGMENT   PHURINE  03/07/2019 1546    TEST NOT REPORTED DUE TO COLOR INTERFERENCE OF URINE PIGMENT   GLUCOSEU (A) 03/07/2019 1546    TEST NOT REPORTED DUE TO COLOR INTERFERENCE OF URINE PIGMENT   HGBUR (A) 03/07/2019 1546    TEST NOT REPORTED DUE TO COLOR INTERFERENCE OF URINE PIGMENT   BILIRUBINUR (A) 03/07/2019 1546    TEST NOT REPORTED DUE TO COLOR INTERFERENCE OF URINE PIGMENT   KETONESUR (A) 03/07/2019 1546    TEST NOT REPORTED DUE TO COLOR INTERFERENCE OF URINE PIGMENT   PROTEINUR (A) 03/07/2019 1546    TEST NOT REPORTED DUE TO COLOR INTERFERENCE OF URINE PIGMENT   NITRITE (A) 03/07/2019 1546    TEST NOT REPORTED DUE TO COLOR INTERFERENCE OF URINE PIGMENT   LEUKOCYTESUR (A) 03/07/2019 1546    TEST NOT REPORTED DUE TO COLOR INTERFERENCE OF URINE PIGMENT   Sepsis Labs: Invalid input(s): PROCALCITONIN, LACTICIDVEN  Recent Results (from the past 240 hour(s))  Urine culture     Status: Abnormal   Collection Time: 03/07/19  4:01 PM  Result Value Ref Range Status   Specimen Description   Final    URINE, RANDOM Performed at [facility], [address] [place], [place] [address]    Special Requests   Final    NONE Performed at [facility], [address] [place], [place] [address]    Culture  (A)  Final    <10,000 COLONIES/mL INSIGNIFICANT GROWTH Performed at [facility], [address], [place], [place] [address]    Report Status 03/09/2019 FINAL  Final  Blood culture (routine x 2)     Status: None   Collection Time: 03/07/19  4:01 PM  Result Value Ref Range Status   Specimen Description   Final    BLOOD RIGHT HAND Performed at [facility], [address] [place], [place] [address]    Special Requests   Final    BOTTLES DRAWN AEROBIC AND ANAEROBIC Blood Culture adequate volume Performed at [facility], [address] [place], [place] [address]    Culture   Final    NO GROWTH 5 DAYS Performed at [facility], [address], [place], [place] [address]    Report Status 03/12/2019 FINAL  Final  Blood culture (routine x 2)     Status: None   Collection Time: 03/07/19  4:06 PM  Result Value Ref Range Status   Specimen Description   Final    BLOOD LEFT HAND Performed at [facility], [address] [place], [place] [address]    Special Requests   Final    BOTTLES DRAWN AEROBIC AND ANAEROBIC Blood Culture adequate volume Performed at [facility], [address] [place], [place] [address]    Culture   Final    NO GROWTH 5 DAYS Performed at [facility], [address] [place], [place] [address]    Report Status 03/12/2019 FINAL  Final      Radiology Studies: No results found.  Pamella Pert, MD, PhD [facility]  Contact via  [email or web address]  [facility] Office Info P: [phone]  F: [phone]

## 2019-03-14 NOTE — TOC Progression Note (Signed)
Transition of Care [facility]) - Progression Note    Patient Details  Name: Nicholas Price MRN: [id number] Date of Birth: 04/18/1971  [facility]) CM/SW Contact  Coralyn Helling, [place] Phone Number: 03/14/2019, 9:49 AM  Clinical Narrative:   Referral made to [facility] for Home with hospice. Cannot accept patient until Monday morning due to number of weekend admissions already in place.     Expected Discharge Plan: Home w Hospice Care Barriers to Discharge: Continued Medical Work up  Expected Discharge Plan and Services Expected Discharge Plan: Home w Hospice Care In-house Referral: Chaplain Discharge Planning Services: Other - See comment(Home with hospice) Post Acute Care Choice: Hospice Living arrangements for the past 2 months: No permanent address Expected Discharge Date: 03/12/19                     [facility]: [facility]   Social Determinants of Health (SDOH) Interventions    Readmission Risk Interventions Readmission Risk Prevention Plan 03/13/2019  Transportation Screening Complete  PCP or Specialist Appt within 5-7 Days Complete  Home Care Screening Complete  Medication Review (RN CM) Not Complete  Some recent data might be hidden

## 2019-03-15 NOTE — Progress Notes (Signed)
PROGRESS NOTE  Nicholas Price QDU:[id number] DOB: 1971/11/13 DOA: 03/07/2019 PCP: Lorre Munroe, NP   LOS: 8 days   Brief Narrative / Interim history: 48 year old gentleman with prior history of hypertension alcohol abuse depression, GERD, liver cirrhosis secondary to alcohol abuse, paroxysmal atrial fibrillation, anxiety presents today for generalized weakness and some shortness of breath. Patient was found to be in decompensated liver failure . GI consulted and recommendations given.   Subjective: No complaints, no abdominal pain, no nausea or vomiting  Assessment & Plan: Active Problems:   Hypertension   Alcohol abuse   Alcohol withdrawal (HCC)   GERD (gastroesophageal reflux disease)   Hepatic failure (HCC)   Hyponatremia   Alcoholic cirrhosis of liver with ascites (HCC)   Elevated lipase   Elevated INR   Thrombocytopenia (HCC)   Anasarca   Hepatic encephalopathy (HCC)   Melena   Hyperbilirubinemia   Alcoholic hepatitis with ascites   Palliative care by specialist   DNR (do not resuscitate)   Jaundice   Ascites due to alcoholic cirrhosis (HCC)   Weakness generalized   Goals of care, counseling/discussion   Principal Problem Acute alcoholic hepatitis in the setting of underlying alcohol induced cirrhosis -GI consulted and following, patient placed on prednisolone,  -Patient placed on prednisolone, continue for 28 days -LFTs are still elevated but overall stable -We will need an EGD as an outpatient but patient is unsure that he wishes for that at this point -Patient placed on diuretics, lactulose, continue, these have a comfort role as well  Active Problems Goals of care -Palliative care was consulted and followed patient while in the hospital.   -Likely he will be enrolled in hospice upon discharge, for now placement is an issue as he cannot live with his mother and father and has no place to go -To be placed home with his ex-spouse with hospice, however hospice  services cannot be initiated until Monday per SW  Fluid overload/edema -Likely in the setting of liver disease, patient was started on diuretics, will need Lasix and spironolactone on discharge -Ultrasound of the abdomen without any significant ascites to be tapped  Melena with concern for GI bleed -Hemoglobin overall stable, will need EGD as an outpatient if patient chooses to, for now focusing on comfort  Coagulopathy, thrombocytopenia -In the setting of liver disease  Alcohol abuse -Patient determined to quit  Paroxysmal A. fib -Not a candidate for anticoagulation  Scheduled Meds: . escitalopram  20 mg Oral Daily  . folic acid  1 mg Oral Daily  . furosemide  40 mg Oral Daily  . lactulose  20 g Oral BID  . multivitamin with minerals  1 tablet Oral Daily  . pantoprazole  40 mg Oral Daily  . prednisoLONE  40 mg Oral Daily  . sodium chloride flush  3 mL Intravenous Q12H  . spironolactone  25 mg Oral Daily  . thiamine  100 mg Oral Daily   Or  . thiamine  100 mg Intravenous Daily   Continuous Infusions: . sodium chloride     PRN Meds:.sodium chloride, hydrOXYzine, LORazepam, ondansetron **OR** ondansetron (ZOFRAN) IV, sodium chloride flush, traZODone  DVT prophylaxis: SCDs Code Status: DNR Family Communication: No family at bedside Disposition Plan: TBD  Consultants:   Gastroenterology  Procedures:   None   Antimicrobials:  None   Objective: Vitals:   03/14/19 0600 03/14/19 1445 03/14/19 2212 03/15/19 0503  BP: (!) 136/104 (!) 145/109 (!) 151/115 (!) 135/108  Pulse: (!) 105 99 100 95  Resp: 20 20 16 16   Temp: 98.6 F (37 C) 98.3 F (36.8 C) 98.5 F (36.9 C) 97.8 F (36.6 C)  TempSrc: Oral Oral Oral Oral  SpO2: 94% 92% 90% 94%  Weight:      Height:        Intake/Output Summary (Last 24 hours) at 03/15/2019 0926 Last data filed at 03/14/2019 1700 Gross per 24 hour  Intake 480 ml  Output -  Net 480 ml   Filed Weights   03/07/19 1544 03/07/19 2316   Weight: (!) 147.4 kg (!) 142.7 kg    Examination:  Constitutional: NAD Respiratory: CTA Cardiovascular: RRR  Data Reviewed: I have independently reviewed following labs and imaging studies    CBC: Recent Labs  Lab Mar 31, 2019 1022 03/12/19 0536 03/14/19 0553  WBC 5.5 6.2 8.6  NEUTROABS 4.2  --   --   HGB 17.0 17.8* 18.2*  HCT 51.4 51.3 52.3*  MCV 104.3* 103.4* 102.1*  PLT 84* 74* 74*   Basic Metabolic Panel: Recent Labs  Lab March 31, 2019 0533 03/10/19 0549 03/11/19 0538 03/12/19 0536 03/14/19 0553  NA 136 138 139 139 138  K 3.2* 3.3* 3.5 3.4* 3.7  CL 94* 96* 98 96* 95*  CO2 30 31 31 31  33*  GLUCOSE 108* 91 117* 104* 97  BUN 18 18 20  21* 25*  CREATININE 0.42* 0.52* 0.65 0.52* 0.35*  CALCIUM 8.7* 8.5* 8.6* 8.5* 8.9   GFR: Estimated Creatinine Clearance: 173.6 mL/min (A) (by C-G formula based on SCr of 0.35 mg/dL (L)). Liver Function Tests: Recent Labs  Lab 2019-03-31 0533 03/10/19 0549 03/11/19 0538 03/12/19 0536 03/14/19 0553  AST 263* 237* 253* 258* 273*  ALT 99* 93* 108* 119* 154*  ALKPHOS 99 95 111 112 104  BILITOT 22.9* 20.4* 21.7* 21.8* 23.1*  PROT 6.3* 6.0* 6.2* 6.2* 6.3*  ALBUMIN 2.1* 2.0* 2.0* 1.9* 2.0*   Recent Labs  Lab 03/08/19 1108  LIPASE 158*   No results for input(s): AMMONIA in the last 168 hours. Coagulation Profile: Recent Labs  Lab 03/12/19 0536  INR 2.1*   Cardiac Enzymes: No results for input(s): CKTOTAL, CKMB, CKMBINDEX, TROPONINI in the last 168 hours. BNP (last 3 results) No results for input(s): PROBNP in the last 8760 hours. HbA1C: No results for input(s): HGBA1C in the last 72 hours. CBG: No results for input(s): GLUCAP in the last 168 hours. Lipid Profile: No results for input(s): CHOL, HDL, LDLCALC, TRIG, CHOLHDL, LDLDIRECT in the last 72 hours. Thyroid Function Tests: No results for input(s): TSH, T4TOTAL, FREET4, T3FREE, THYROIDAB in the last 72 hours. Anemia Panel: No results for input(s): VITAMINB12, FOLATE,  FERRITIN, TIBC, IRON, RETICCTPCT in the last 72 hours. Urine analysis:    Component Value Date/Time   COLORURINE BROWN (A) 03/07/2019 1546   APPEARANCEUR CLOUDY (A) 03/07/2019 1546   LABSPEC  03/07/2019 1546    TEST NOT REPORTED DUE TO COLOR INTERFERENCE OF URINE PIGMENT   PHURINE  03/07/2019 1546    TEST NOT REPORTED DUE TO COLOR INTERFERENCE OF URINE PIGMENT   GLUCOSEU (A) 03/07/2019 1546    TEST NOT REPORTED DUE TO COLOR INTERFERENCE OF URINE PIGMENT   HGBUR (A) 03/07/2019 1546    TEST NOT REPORTED DUE TO COLOR INTERFERENCE OF URINE PIGMENT   BILIRUBINUR (A) 03/07/2019 1546    TEST NOT REPORTED DUE TO COLOR INTERFERENCE OF URINE PIGMENT   KETONESUR (A) 03/07/2019 1546    TEST NOT REPORTED DUE TO COLOR INTERFERENCE OF URINE PIGMENT   PROTEINUR (  A) 03/07/2019 1546    TEST NOT REPORTED DUE TO COLOR INTERFERENCE OF URINE PIGMENT   NITRITE (A) 03/07/2019 1546    TEST NOT REPORTED DUE TO COLOR INTERFERENCE OF URINE PIGMENT   LEUKOCYTESUR (A) 03/07/2019 1546    TEST NOT REPORTED DUE TO COLOR INTERFERENCE OF URINE PIGMENT   Sepsis Labs: Invalid input(s): PROCALCITONIN, LACTICIDVEN  Recent Results (from the past 240 hour(s))  Urine culture     Status: Abnormal   Collection Time: 03/07/19  4:01 PM  Result Value Ref Range Status   Specimen Description   Final    URINE, RANDOM Performed at [facility], [address] [place], [place] [address]    Special Requests   Final    NONE Performed at [facility], [address] [place], [place] [address]    Culture (A)  Final    <10,000 COLONIES/mL INSIGNIFICANT GROWTH Performed at [facility], [address] [place], [place] [address]    Report Status 03/09/2019 FINAL  Final  Blood culture (routine x 2)     Status: None   Collection Time: 03/07/19  4:01 PM  Result Value Ref Range Status   Specimen Description   Final    BLOOD RIGHT HAND Performed at [facility],  [address] [place], [place] [address]    Special Requests   Final    BOTTLES DRAWN AEROBIC AND ANAEROBIC Blood Culture adequate volume Performed at [facility], [address] [place], [place] [address]    Culture   Final    NO GROWTH 5 DAYS Performed at [facility], [address] [place], [place] [address]    Report Status 03/12/2019 FINAL  Final  Blood culture (routine x 2)     Status: None   Collection Time: 03/07/19  4:06 PM  Result Value Ref Range Status   Specimen Description   Final    BLOOD LEFT HAND Performed at [facility], [address] [place], [place] [address]    Special Requests   Final    BOTTLES DRAWN AEROBIC AND ANAEROBIC Blood Culture adequate volume Performed at [facility], [address] [place], [place] [address]    Culture   Final    NO GROWTH 5 DAYS Performed at [facility], [address] [place], [place] [address]    Report Status 03/12/2019 FINAL  Final      Radiology Studies: No results found.  Pamella Pertostin Gherghe, MD, PhD [facility]  Contact via  [email or web address]  [facility] Office Info P: [phone]  F: [phone]

## 2019-03-16 ENCOUNTER — Telehealth: Payer: Self-pay

## 2019-03-16 MED ORDER — LORAZEPAM 1 MG PO TABS: 1.0000 mg | ORAL_TABLET | ORAL | 0 refills | Status: AC | PRN

## 2019-03-16 MED ORDER — PANTOPRAZOLE SODIUM 40 MG PO TBEC: 40.0000 mg | DELAYED_RELEASE_TABLET | Freq: Every day | ORAL | 1 refills | Status: AC

## 2019-03-16 MED ORDER — FUROSEMIDE 40 MG PO TABS: 40.0000 mg | ORAL_TABLET | Freq: Every day | ORAL | 1 refills | Status: AC

## 2019-03-16 MED ORDER — PREDNISOLONE 5 MG PO TABS: 40.0000 mg | ORAL_TABLET | Freq: Every day | ORAL | 0 refills | Status: AC

## 2019-03-16 MED ORDER — METOPROLOL TARTRATE 75 MG PO TABS: 75.0000 mg | ORAL_TABLET | Freq: Two times a day (BID) | ORAL | 1 refills | Status: DC

## 2019-03-16 MED ORDER — LACTULOSE 10 GM/15ML PO SOLN: 20.0000 g | Freq: Two times a day (BID) | ORAL | 1 refills | Status: AC

## 2019-03-16 MED ORDER — SPIRONOLACTONE 25 MG PO TABS: 25.0000 mg | ORAL_TABLET | Freq: Every day | ORAL | 1 refills | Status: AC

## 2019-03-16 NOTE — Progress Notes (Signed)
   03/16/19 1200  Clinical Encounter Type  Visited With Patient  Visit Type Follow-up;Psychological support;Spiritual support  Referral From Patient  Consult/Referral To Chaplain  Spiritual Encounters  Spiritual Needs Emotional;Other (Comment) (Spiritual Care Conversation)  Stress Factors  Patient Stress Factors Health changes;Major life changes;Other (Comment)  Advance Directives (For Healthcare)  Does Patient Have a Medical Advance Directive? No  Would patient like information on creating a medical advance directive? Yes (Inpatient - patient requests chaplain consult to create a medical advance directive) (Due to the coronavirus we are unable to complete at this tim)   I spoke with the patient in a follow up visit to discuss completing an Advance Directive. I explained to the patient that due to the coronavirus and our inability to get witnesses that we would not be able to complete the document at this time.  He wants to make sure that medical staff know that he wants his wife, whom he is separated from, to make healthcare decisions for him in the event that he is unable to.   Please, contact Spiritual Care for further assistance.   Chaplain Clint Bolder M.Div., [facility]

## 2019-03-16 NOTE — TOC Transition Note (Addendum)
Transition of Care [facility]) - CM/SW Discharge Note   Patient Details  Name: Nicholas Price MRN: [id number] Date of Birth: Apr 04, 1971  Transition of Care [facility]) CM/SW Contact:  Clearance Coots, LCSW Phone Number: 03/16/2019, 2:45 PM   Clinical Narrative:     CSW confirmed with [facility] the patient hospital bed will be delivered to [address], [place], [place] [address] CSW updated patient spouse- [name]. She plans to be at home when the bed is delivered.  Patient will transport by [facility], MED. [place]. Form completed.   Spouse had questions about a disability application for the patient.  CSW confirmed with Diannia Ruder, a Child psychotherapist will assist the patient with this process.   4:25pm Spouse confirm bed has been delivered. [facility] arranged to transport the patient home. CSW notified RN.   Final next level of care: Home w Hospice Care Barriers to Discharge: Continued Medical Work up   Patient Goals and CMS Choice Patient states their goals for this hospitalization and ongoing recovery are:: stable housing and comfort care. CMS Medicare.gov Compare Post Acute Care list provided to: Other (Comment Required) Choice offered to / list presented to : Spouse  Discharge Placement Home w/ Hospice Care              Patient to be transferred to facility by: [facility]  Name of family member notified: Ex-spouse [name]  Patient and family notified of of transfer: 03/16/19  Discharge Plan and Services In-house Referral: Chaplain Discharge Planning Services: Other - See comment(Home with hospice) Post Acute Care Choice: Hospice                [facility] Agency: [facility] [place]   Social Determinants of Health (SDOH) Interventions     Readmission Risk Interventions Readmission Risk Prevention Plan 03/13/2019  Transportation Screening Complete  PCP or Specialist Appt within 5-7 Days Complete  Home Care Screening Complete  Medication Review (RN CM) Not Complete  Some  recent data might be hidden

## 2019-03-16 NOTE — Telephone Encounter (Signed)
Nicholas Price with [facility] left v/m requesting verbal orders to begin hospice services. [name] put in referral from hospital if [name] NP is in agreement.

## 2019-03-16 NOTE — Progress Notes (Signed)
Daily Progress Note   Patient Name: Nicholas Price       Date: 03/16/2019 DOB: 30-Jun-1971  Age: 48 y.o. MRN#: [id number] Attending Physician: Leatha Gilding, MD Primary Care Physician: Lorre Munroe, NP Admit Date: 03/07/2019  Reason for Consultation/Follow-up: Establishing goals of care  Subjective:  patient is resting in bed, he denies any pain.   Appears weaker, appears with flat affect.     Length of Stay: 9  Current Medications: Scheduled Meds:  . escitalopram  20 mg Oral Daily  . folic acid  1 mg Oral Daily  . furosemide  40 mg Oral Daily  . lactulose  20 g Oral BID  . multivitamin with minerals  1 tablet Oral Daily  . pantoprazole  40 mg Oral Daily  . prednisoLONE  40 mg Oral Daily  . sodium chloride flush  3 mL Intravenous Q12H  . spironolactone  25 mg Oral Daily  . thiamine  100 mg Oral Daily   Or  . thiamine  100 mg Intravenous Daily    Continuous Infusions: . sodium chloride      PRN Meds: sodium chloride, hydrOXYzine, LORazepam, ondansetron **OR** ondansetron (ZOFRAN) IV, sodium chloride flush, traZODone  Physical Exam         Icteric appearing Appears to have flat affect No distress Appears chronically ill Clear S1 S2 Abdomen is distended Has trace edema asterixis +  Vital Signs: BP (!) 138/108   Pulse 97   Temp 98.4 F (36.9 C)   Resp 18   Ht 6\' 2"  (1.88 m)   Wt (!) 142.7 kg   SpO2 91%   BMI 40.39 kg/m  SpO2: SpO2: 91 % O2 Device: O2 Device: Room Air O2 Flow Rate: O2 Flow Rate (L/min): 2 L/min  Intake/output summary:   Intake/Output Summary (Last 24 hours) at 03/16/2019 1256 Last data filed at 03/16/2019 6438 Gross per 24 hour  Intake 240 ml  Output 2800 ml  Net -2560 ml   LBM: Last BM Date: 03/15/19 Baseline Weight: Weight: (!)  147.4 kg Most recent weight: Weight: (!) 142.7 kg       Palliative Assessment/Data:    Flowsheet Rows     Most Recent Value  Intake Tab  Referral Department  Hospitalist  Unit at Time of Referral  Med/Surg Unit  Date Notified  03/09/19  Palliative Care Type  New Palliative care  Reason for referral  Clarify Goals of Care  Date of Admission  03/07/19  Date first seen by Palliative Care  03/10/19  # of days Palliative referral response time  1 Day(s)  # of days IP prior to Palliative referral  2  Clinical Assessment  Psychosocial & Spiritual Assessment  Palliative Care Outcomes      Patient Active Problem List   Diagnosis Date Noted  . Goals of care, counseling/discussion   . Ascites due to alcoholic cirrhosis (HCC)   . Weakness generalized   . Jaundice   . Alcoholic hepatitis with ascites   . Palliative care by specialist   . DNR (do not resuscitate)   . Hyperbilirubinemia   . Hepatic failure (HCC) 03/07/2019  . Hyponatremia 03/07/2019  . Alcoholic cirrhosis of liver with ascites (HCC) 03/07/2019  . Elevated lipase 03/07/2019  . Elevated INR 03/07/2019  . Thrombocytopenia (HCC) 03/07/2019  . Anasarca 03/07/2019  . Hepatic encephalopathy (HCC) 03/07/2019  . Melena 03/07/2019  . Alcohol dependence with withdrawal, uncomplicated (HCC) 12/14/2018  . Major depressive disorder, recurrent, severe without psychotic features (HCC) 12/14/2018  . Major depressive disorder, recurrent severe without psychotic features (HCC) 12/14/2018  . Alcohol abuse with alcohol-induced mood disorder (HCC) 12/11/2017  . Alcohol abuse 11/26/2017  . Alcohol abuse with intoxication (HCC) 11/26/2017  . Alcohol withdrawal (HCC) 11/26/2017  . GERD (gastroesophageal reflux disease) 11/26/2017  . Abnormal LFTs 11/26/2017  . Abdominal pain 11/26/2017  . Insomnia 07/07/2015  . Anxiety and depression   . Hypertension 07/10/2012    Palliative Care Assessment & Plan   Patient Profile:     Assessment:  ETOH use Depression GERD Liver cirrhosis due to ETOH Anxiety Paroxysmal A fib Decompensated liver failure MELD score is 26 points, with a 20% 3 month mortality risk.   Recommendations/Plan:   Patient wants to make sure that hospice arrangements are being coordinated with his wife (they are now separated but she has agreed to home with hospice and to be his HCPOA agent)    Home with hospice with wife soon.  Continue current mode of care. Patient remains at high risk for ongoing decline from his liver disease.  It is possible that his prognosis is less than 6 months.  Code Status:    Code Status Orders  (From admission, onward)         Start     Ordered   03/07/19 2317  Do not attempt resuscitation (DNR)  Continuous    Question Answer Comment  In the event of cardiac or respiratory ARREST Do not call a "code blue"   In the event of cardiac or respiratory ARREST Do not perform Intubation, CPR, defibrillation or ACLS   In the event of cardiac or respiratory ARREST Use medication by any route, position, wound care, and other measures to relive pain and suffering. May use oxygen, suction and manual treatment of airway obstruction as needed for comfort.      03/07/19 2316        Code Status History    Date Active Date Inactive Code Status Order ID Comments User Context   12/14/2018 1155 12/19/2018 1902 Full Code [id number]  Charm Rings, NP Inpatient   12/14/2018 0131 12/14/2018 1047 Full Code [id number]  Melene Plan, DO ED   12/01/2018 0412 12/02/2018 0339 Full Code [id number]  Glynn Octave, MD ED   03/25/2018 2022 03/26/2018 1609 Full Code [id number]  [name], MD  ED   01/20/2018 1848 01/21/2018 1352 Full Code [id number]  Cathren Laine, MD ED   12/10/2017 1420 12/11/2017 2010 Full Code [id number]  Kaylyn Lim ED   11/26/2017 2051 11/29/2017 1616 Full Code [id number]  Lorretta Harp, MD ED   05/20/2015 0213 05/21/2015 1340 Full Code [id number]  Lorretta Harp, MD ED    07/09/2012 2236 07/10/2012 2233 Full Code [id number]  Hosmer, Faylene Million, MD ED       Prognosis:   less than 6 months   Discharge Planning:  Home with hospice  Care plan was discussed with patient   Thank you for allowing the Palliative Medicine Team to assist in the care of this patient.   Time In: 12 Time Out: 12.25 Total Time 25 Prolonged Time Billed  no       Greater than 50%  of this time was spent counseling and coordinating care related to the above assessment and plan.  Rosalin Hawking, MD [phone] Please contact Palliative Medicine Team phone at [phone] for questions and concerns.

## 2019-03-16 NOTE — Discharge Summary (Addendum)
Physician Discharge Summary  Nicholas Price ZOX:[id number] DOB: 25-Mar-1971 DOA: 03/07/2019  PCP: Nicholas Munroe, NP  Admit date: 03/07/2019 Discharge date: 03/16/2019  Admitted From: home Disposition:  Home with hospice  Recommendations for Outpatient Follow-up:  1. Follow up with PCP in 1-2 weeks 2. Follow up with GI if patient wishes   Home Health: none Equipment/Devices: hospital bed  Discharge Condition: stable CODE STATUS: DNR Diet recommendation: low sodium  HPI: Per admitting MD, Nicholas Price is a 48 y.o. male with medical history significant of alcohol abuse depression, anxiety, atrial fibrillation, hypertension  Presented with   generalized fatigue and jaundice.  Came from home complaining shortness of breath no appetite and generalized weakness noticed to have some jaundice. For the past few days had forgetfulness He has had up to 8 BM's a day stools have been black He drinks 3 bottles a day. Has been more short of breath He has been drinking for 27 years Last ETOH was Thurday night He has been sober up to 5 years Regarding pertinent Chronic problems:  EtOH abuse - reports no hx of severe ETOH withdrawal History of hypertension for which she takes lisinopril metoprolol  Hospital Course: Principal Problem Acute alcoholic hepatitis in the setting of underlying alcohol induced cirrhosis -GI consulted and following, patient placed on prednisolone, total of 28 days with 21 days remaining. LFTs are still elevated but overall stable. We will need an EGD as an outpatient but patient is unsure that he wishes for that at this point. Patient placed on diuretics, lactulose, continue.  Active Problems Goals of care -Palliative care was consulted and followed patient while in the hospital, after discussions home with hospice was set up per patient wishes.  Fluid overload/edema -Likely in the setting of liver disease, patient was started on diuretics, will need Lasix and  spironolactone on discharge. Ultrasound of the abdomen without any significant ascites to be tapped Melena with concern for GI bleed -Hemoglobin overall stable, will need EGD as an outpatient if patient chooses to, for now focusing on comfort Coagulopathy, thrombocytopenia -In the setting of liver disease Alcohol abuse -Patient determined to quit Paroxysmal A. Fib -Not a candidate for anticoagulation   Discharge Diagnoses:  Active Problems:   Hypertension   Alcohol abuse   Alcohol withdrawal (HCC)   GERD (gastroesophageal reflux disease)   Hepatic failure (HCC)   Hyponatremia   Alcoholic cirrhosis of liver with ascites (HCC)   Elevated lipase   Elevated INR   Thrombocytopenia (HCC)   Anasarca   Hepatic encephalopathy (HCC)   Melena   Hyperbilirubinemia   Alcoholic hepatitis with ascites   Palliative care by specialist   DNR (do not resuscitate)   Jaundice   Ascites due to alcoholic cirrhosis (HCC)   Weakness generalized   Goals of care, counseling/discussion     Discharge Instructions   Allergies as of 03/16/2019   No Known Allergies     Medication List    STOP taking these medications   ibuprofen 200 MG tablet Commonly known as:  ADVIL,MOTRIN     TAKE these medications   escitalopram 20 MG tablet Commonly known as:  LEXAPRO Take 1 tablet (20 mg total) by mouth daily. For mood   furosemide 40 MG tablet Commonly known as:  Lasix Take 1 tablet (40 mg total) by mouth daily.   hydrOXYzine 25 MG tablet Commonly known as:  ATARAX/VISTARIL Take 1 tablet (25 mg total) by mouth 3 (three) times daily as needed for anxiety or  nausea.   lactulose 10 GM/15ML solution Commonly known as:  CHRONULAC Take 30 mLs (20 g total) by mouth 2 (two) times daily for 30 days.   LORazepam 1 MG tablet Commonly known as:  ATIVAN Take 1 tablet (1 mg total) by mouth every 4 (four) hours as needed for anxiety (use 2nd).   Metoprolol Tartrate 75 MG Tabs Take 75 mg by mouth 2 (two)  times daily. For high blood pressure What changed:  how much to take   pantoprazole 40 MG tablet Commonly known as:  PROTONIX Take 1 tablet (40 mg total) by mouth daily.   prednisoLONE 5 MG Tabs tablet Take 8 tablets (40 mg total) by mouth daily for 21 days. Start taking on:  March 17, 2019   spironolactone 25 MG tablet Commonly known as:  ALDACTONE Take 1 tablet (25 mg total) by mouth daily.   traZODone 50 MG tablet Commonly known as:  DESYREL Take 1 tablet (50 mg total) by mouth at bedtime as needed for sleep.            Durable Medical Equipment  (From admission, onward)         Start     Ordered   03/13/19 1605  For home use only DME Hospital bed  Once    Question Answer Comment  The above medical condition requires: Patient requires the ability to reposition frequently   Head must be elevated greater than: 30 degrees   Bed type Semi-electric      03/13/19 1604         Follow-up Information    Lynann Bologna, MD. Schedule an appointment as soon as possible for a visit in 1 week(s).   Specialties:  Gastroenterology, Internal Medicine Contact information: [address] [place] [address] [phone]           Consultations:  Gastroenterology   Palliative care  Procedures/Studies;  Dg Chest 2 View  Result Date: 03/07/2019 CLINICAL DATA:  Shortness of breath for 2 weeks. EXAM: CHEST - 2 VIEW COMPARISON:  Chest radiograph 04/28/2018 FINDINGS: Monitoring leads overlie the patient. Stable cardiomegaly. Low lung volumes. Heterogeneous opacities right lower lung. No pleural effusion or pneumothorax. Thoracic spine degenerative changes. IMPRESSION: Heterogeneous opacities right lower lung may represent atelectasis or infection. Electronically Signed   By: Annia Belt M.D.   On: 03/07/2019 17:23   Ct Abdomen Pelvis W Contrast  Result Date: 03/07/2019 CLINICAL DATA:  Abdominal distention and bilateral leg edema. Lethargy.  Weakness. Alcohol abuse. Ex-smoker. Inability to establish patency of the portal vein at ultrasound earlier today due to difficulty penetrating the liver. EXAM: CT ABDOMEN AND PELVIS WITH CONTRAST TECHNIQUE: Multidetector CT imaging of the abdomen and pelvis was performed using the standard protocol following bolus administration of intravenous contrast. CONTRAST:  ISOVUE-300 IOPAMIDOL (ISOVUE-300) INJECTION 61% COMPARISON:  Right upper quadrant abdomen ultrasound obtained earlier today. FINDINGS: Lower chest: Mild bibasilar atelectasis/scarring. Normal sized heart. Hepatobiliary: Marked diffuse low density of the liver relative to the spleen. The caudate lobe of the liver is significantly enlarged. Probable sludge in the gallbladder. The gallbladder is mildly dilated borderline wall thickening. No pericholecystic fluid or stranding. Pancreas: Unremarkable. No pancreatic ductal dilatation or surrounding inflammatory changes. Spleen: Mildly enlarged. Adrenals/Urinary Tract: Adrenal glands are unremarkable. Kidneys are normal, without renal calculi, focal lesion, or hydronephrosis. Bladder is unremarkable. Stomach/Bowel: Stomach is within normal limits. Appendix appears normal. Concentric and eccentric rectal wall thickening, measuring 11 mm in maximum thickness distally.  Otherwise, unremarkable colon. Normal appearing small bowel. Vascular/Lymphatic: Upper abdominal varices. Patent portal vein. No vascular occlusions are seen. No significant arterial abnormalities. No enlarged abdominal or pelvic lymph nodes. Reproductive: Prostate is unremarkable. Other: Moderate amount of free peritoneal fluid. Small umbilical and paraumbilical hernias containing fat. Musculoskeletal: Mild lower thoracic spine degenerative changes. IMPRESSION: 1. Changes of cirrhosis of the liver with marked hepatic steatosis and evidence of portal venous hypertension with associated splenomegaly and upper abdominal varices. 2. Moderate  amount of ascites. 3. Concentric and eccentric rectal wall thickening, measuring 11 mm in maximum thickness distally. This could be due to proctitis. A neoplasm is less likely but not excluded. 4. Mildly dilated gallbladder with probable sludge and borderline diffuse wall thickening. The wall thickening may be due to hypoproteinemia. Electronically Signed   By: Beckie Salts M.D.   On: 03/07/2019 20:50   [place] Ascites (abdomen Limited)  Result Date: 03/09/2019 CLINICAL DATA:  Concern for intra-abdominal ascites. Please perform ascites search ultrasound and ultrasound-guided paracentesis as indicated. EXAM: LIMITED ABDOMEN ULTRASOUND FOR ASCITES TECHNIQUE: Limited ultrasound survey for ascites was performed in all four abdominal quadrants. COMPARISON:  CT abdomen pelvis-03/07/2019 FINDINGS: Sonographic evaluation of the abdomen demonstrates a trace amount of intra-abdominal ascites, too small to allow for safe ultrasound-guided paracentesis. No paracentesis attempted. IMPRESSION: Trace amount of intra-abdominal ascites, too small to allow for safe ultrasound-guided paracentesis. No paracentesis attempted. Electronically Signed   By: Simonne Come M.D.   On: 03/09/2019 19:05   US Abdomen Limited Ruq  Result Date: 03/07/2019 CLINICAL DATA:  Right upper quadrant pain for 2 weeks. Elevated LFTs EXAM: ULTRASOUND ABDOMEN LIMITED RIGHT UPPER QUADRANT COMPARISON:  None. FINDINGS: Gallbladder: No visible stones. Gallbladder wall slightly thickened at 4 mm. Negative sonographic Murphy's. Common bile duct: Diameter: Normal caliber, 6 mm Liver: Diffusely increased echotexture compatible with fatty infiltration. No focal hepatic abnormality. Cannot visualized or confirm blood flow in the main portal vein. IMPRESSION: Severe diffuse fatty infiltration of the liver. Cannot visualized or confirm portal venous blood flow. Consider further evaluation with CT with IV contrast to exclude portal venous thrombosis. Slight gallbladder  wall thickening without visible stones or sonographic Murphy sign. This likely is related to liver disease. Electronically Signed   By: Charlett Nose M.D.   On: 03/07/2019 19:11     Subjective: -feels intermittent nausea, able to eat relatively well,   Discharge Exam: BP (!) 138/108    Pulse 97    Temp 98.4 F (36.9 C)    Resp 18    Ht 6\' 2"  (1.88 m)    Wt (!) 142.7 kg    SpO2 91%    BMI 40.39 kg/m   General: Pt is alert, awake, not in acute distress Cardiovascular: RRR, S1/S2 +, no rubs, no gallops Respiratory: CTA bilaterally, no wheezing, no rhonchi Abdominal: Soft, NT, ND, bowel sounds + Extremities: no edema, no cyanosis    The results of significant diagnostics from this hospitalization (including imaging, microbiology, ancillary and laboratory) are listed below for reference.     Microbiology: Recent Results (from the past 240 hour(s))  Urine culture     Status: Abnormal   Collection Time: 03/07/19  4:01 PM  Result Value Ref Range Status   Specimen Description   Final    URINE, RANDOM Performed at [facility], [address], [place], [place] [address]    Special Requests   Final    NONE Performed at [facility], [address]  [place], [place] [address]    Culture (A)  Final    <10,000 COLONIES/mL INSIGNIFICANT GROWTH Performed at [facility], [address] [place], [place] [address]    Report Status 03/09/2019 FINAL  Final  Blood culture (routine x 2)     Status: None   Collection Time: 03/07/19  4:01 PM  Result Value Ref Range Status   Specimen Description   Final    BLOOD RIGHT HAND Performed at [facility], [address] [place], [place] [address]    Special Requests   Final    BOTTLES DRAWN AEROBIC AND ANAEROBIC Blood Culture adequate volume Performed at [facility], [address] [place], [place] [address]    Culture   Final    NO GROWTH 5 DAYS Performed at  [facility], [address] [place], [place] [address]    Report Status 03/12/2019 FINAL  Final  Blood culture (routine x 2)     Status: None   Collection Time: 03/07/19  4:06 PM  Result Value Ref Range Status   Specimen Description   Final    BLOOD LEFT HAND Performed at [facility], [address] [place], [place] [address]    Special Requests   Final    BOTTLES DRAWN AEROBIC AND ANAEROBIC Blood Culture adequate volume Performed at [facility], [address] [place], [place] [address]    Culture   Final    NO GROWTH 5 DAYS Performed at [facility], [address] [place], [place] [address]    Report Status 03/12/2019 FINAL  Final     Labs: BNP (last 3 results) Recent Labs    03/07/19 1543  BNP 37.1   Basic Metabolic Panel: Recent Labs  Lab 03/10/19 0549 03/11/19 0538 03/12/19 0536 03/14/19 0553  NA 138 139 139 138  K 3.3* 3.5 3.4* 3.7  CL 96* 98 96* 95*  CO2 31 31 31  33*  GLUCOSE 91 117* 104* 97  BUN 18 20 21* 25*  CREATININE 0.52* 0.65 0.52* 0.35*  CALCIUM 8.5* 8.6* 8.5* 8.9   Liver Function Tests: Recent Labs  Lab 03/10/19 0549 03/11/19 0538 03/12/19 0536 03/14/19 0553  AST 237* 253* 258* 273*  ALT 93* 108* 119* 154*  ALKPHOS 95 111 112 104  BILITOT 20.4* 21.7* 21.8* 23.1*  PROT 6.0* 6.2* 6.2* 6.3*  ALBUMIN 2.0* 2.0* 1.9* 2.0*   No results for input(s): LIPASE, AMYLASE in the last 168 hours. No results for input(s): AMMONIA in the last 168 hours. CBC: Recent Labs  Lab 03/12/19 0536 03/14/19 0553  WBC 6.2 8.6  HGB 17.8* 18.2*  HCT 51.3 52.3*  MCV 103.4* 102.1*  PLT 74* 74*   Cardiac Enzymes: No results for input(s): CKTOTAL, CKMB, CKMBINDEX, TROPONINI in the last 168 hours. BNP: Invalid input(s): POCBNP CBG: No results for input(s): GLUCAP in the last 168 hours. D-Dimer No results for input(s): DDIMER in the last 72 hours. Hgb A1c No results for input(s): HGBA1C in the last 72  hours. Lipid Profile No results for input(s): CHOL, HDL, LDLCALC, TRIG, CHOLHDL, LDLDIRECT in the last 72 hours. Thyroid function studies No results for input(s): TSH, T4TOTAL, T3FREE, THYROIDAB in the last 72 hours.  Invalid input(s): FREET3 Anemia work up No results for input(s): VITAMINB12, FOLATE, FERRITIN, TIBC, IRON, RETICCTPCT in the last 72 hours. Urinalysis    Component Value Date/Time   COLORURINE BROWN (A) 03/07/2019 1546   APPEARANCEUR CLOUDY (  A) 03/07/2019 1546   LABSPEC  03/07/2019 1546    TEST NOT REPORTED DUE TO COLOR INTERFERENCE OF URINE PIGMENT   PHURINE  03/07/2019 1546    TEST NOT REPORTED DUE TO COLOR INTERFERENCE OF URINE PIGMENT   GLUCOSEU (A) 03/07/2019 1546    TEST NOT REPORTED DUE TO COLOR INTERFERENCE OF URINE PIGMENT   HGBUR (A) 03/07/2019 1546    TEST NOT REPORTED DUE TO COLOR INTERFERENCE OF URINE PIGMENT   BILIRUBINUR (A) 03/07/2019 1546    TEST NOT REPORTED DUE TO COLOR INTERFERENCE OF URINE PIGMENT   KETONESUR (A) 03/07/2019 1546    TEST NOT REPORTED DUE TO COLOR INTERFERENCE OF URINE PIGMENT   PROTEINUR (A) 03/07/2019 1546    TEST NOT REPORTED DUE TO COLOR INTERFERENCE OF URINE PIGMENT   NITRITE (A) 03/07/2019 1546    TEST NOT REPORTED DUE TO COLOR INTERFERENCE OF URINE PIGMENT   LEUKOCYTESUR (A) 03/07/2019 1546    TEST NOT REPORTED DUE TO COLOR INTERFERENCE OF URINE PIGMENT   Sepsis Labs Invalid input(s): PROCALCITONIN,  WBC,  LACTICIDVEN  FURTHER DISCHARGE INSTRUCTIONS:   Get Medicines reviewed and adjusted: Please take all your medications with you for your next visit with your Primary MD   Laboratory/radiological data: Please request your Primary MD to go over all hospital tests and procedure/radiological results at the follow up, please ask your Primary MD to get all Hospital records sent to his/her office.   In some cases, they will be blood work, cultures and biopsy results pending at the time of your discharge. Please request that  your primary care M.D. goes through all the records of your hospital data and follows up on these results.   Also Note the following: If you experience worsening of your admission symptoms, develop shortness of breath, life threatening emergency, suicidal or homicidal thoughts you must seek medical attention immediately by calling 911 or calling your MD immediately  if symptoms less severe.   You must read complete instructions/literature along with all the possible adverse reactions/side effects for all the Medicines you take and that have been prescribed to you. Take any new Medicines after you have completely understood and accpet all the possible adverse reactions/side effects.    Do not drive when taking Pain medications or sleeping medications (Benzodaizepines)   Do not take more than prescribed Pain, Sleep and Anxiety Medications. It is not advisable to combine anxiety,sleep and pain medications without talking with your primary care practitioner   Special Instructions: If you have smoked or chewed Tobacco  in the last 2 yrs please stop smoking, stop any regular Alcohol  and or any Recreational drug use.   Wear Seat belts while driving.   Please note: You were cared for by a hospitalist during your hospital stay. Once you are discharged, your primary care physician will handle any further medical issues. Please note that NO REFILLS for any discharge medications will be authorized once you are discharged, as it is imperative that you return to your primary care physician (or establish a relationship with a primary care physician if you do not have one) for your post hospital discharge needs so that they can reassess your need for medications and monitor your lab values.  Time coordinating discharge: 25 minutes  SIGNED:  Pamella Pert, PA-S 03/16/2019, 1:18 PM

## 2019-03-16 NOTE — Telephone Encounter (Signed)
Ok for hospice services

## 2019-03-17 ENCOUNTER — Telehealth: Payer: Self-pay

## 2019-03-17 NOTE — Telephone Encounter (Signed)
VO given as instructed  

## 2019-03-17 NOTE — Telephone Encounter (Signed)
Patient discharged from [facility] with diagnoses as follows:  Hypertension   Alcohol abuse   Alcohol withdrawal (HCC)   GERD (gastroesophageal reflux disease)   Hepatic failure (HCC)   Hyponatremia   Alcoholic cirrhosis of liver with ascites (HCC)   Elevated lipase   Elevated INR   Thrombocytopenia (HCC)   Anasarca   Hepatic encephalopathy (HCC)   Melena   Hyperbilirubinemia   Alcoholic hepatitis with ascites   Palliative care by specialist   DNR (do not resuscitate)   Jaundice   Ascites due to alcoholic cirrhosis (HCC)   Weakness generalized   Goals of care, counseling/discussion  Per discharge summary:  Recommendations for Outpatient Follow-up:  1. Follow up with PCP in 1-2 weeks 2. Follow up with GI if patient wishes   Attempted to reach patient on mobile phone. Attempt unsuccessful. Left message with contact info.

## 2019-03-20 ENCOUNTER — Telehealth: Payer: Self-pay

## 2019-03-20 NOTE — Telephone Encounter (Signed)
Mitzi called from [employer]. Called to speak to [facility] in regards to his medications. Wife and nurse have questions.  I spoke to [facility]. We figured out that he is taking the prednisone for the acute hepatitis.  He is taking metoprolol 25mg  2 in am and 3 in pm. BP running 110/80. Wants to continue that dose if ok with [facility] or Dr Alphonsus Sias. Knows that it will be Monday before we can get her a verbal authorization for that.

## 2019-03-21 NOTE — Telephone Encounter (Signed)
That is okay I spoke to her on the phone and our only question was the prednisone (I thought it was probably for acute hepatic inflammation) Sounds like the beta blocker should continue as is---that reduces the risk of variceal bleed Will send to [facility] also

## 2019-03-23 NOTE — Telephone Encounter (Signed)
Agree with recommendations.  

## 2019-03-24 ENCOUNTER — Telehealth: Payer: Self-pay

## 2019-03-24 ENCOUNTER — Encounter: Payer: Self-pay | Admitting: Internal Medicine

## 2019-03-24 NOTE — Telephone Encounter (Signed)
Received inbound call from Mitzi/[facility] requesting medication management assistance on patient's behalf. Per Mitzi, patient cannot afford liquid Prednisolone at this time and wanted to know if PCP can prescribe oral Prednisone. Also, Mitzi requested clarification on dosage of Metoprolol.

## 2019-03-24 NOTE — Telephone Encounter (Signed)
Advised PCP of hospice's request for medication management assistance. PCP has requested a WebEx or office visit with patient prior to prescribing medication at this time.   Contacted Mitzi/[facility] and advised her of PCP's instruction regarding medication management assistance.  Attempted to reach patient and spouse on mobile phones. Attempts unsuccessful. Left message with contact info.

## 2019-03-25 ENCOUNTER — Other Ambulatory Visit: Payer: Self-pay

## 2019-03-25 ENCOUNTER — Ambulatory Visit (INDEPENDENT_AMBULATORY_CARE_PROVIDER_SITE_OTHER): Payer: Self-pay | Admitting: Internal Medicine

## 2019-03-25 DIAGNOSIS — K7290 Hepatic failure, unspecified without coma: Secondary | ICD-10-CM

## 2019-03-25 DIAGNOSIS — K7010 Alcoholic hepatitis without ascites: Secondary | ICD-10-CM

## 2019-03-25 DIAGNOSIS — K219 Gastro-esophageal reflux disease without esophagitis: Secondary | ICD-10-CM

## 2019-03-25 DIAGNOSIS — I10 Essential (primary) hypertension: Secondary | ICD-10-CM

## 2019-03-25 DIAGNOSIS — F39 Unspecified mood [affective] disorder: Secondary | ICD-10-CM

## 2019-03-25 DIAGNOSIS — K7210 Chronic hepatic failure without coma: Secondary | ICD-10-CM

## 2019-03-25 MED ORDER — METOPROLOL TARTRATE 25 MG PO TABS: 25.0000 mg | ORAL_TABLET | Freq: Two times a day (BID) | ORAL | 2 refills | Status: AC

## 2019-03-25 NOTE — Progress Notes (Signed)
Virtual Visit via Video Note  I connected with Nicholas Price on 03/25/19 at  3:15 PM EDT by a video enabled telemedicine application and verified that I am speaking with the correct person using two identifiers.   I discussed the limitations of evaluation and management by telemedicine and the availability of in person appointments. The patient expressed understanding and agreed to proceed.  History of Present Illness:  Pt due for hospital followup. He went to the ER 03/07/19 for fatigue, weakness, poor appetite, jaundice and shortness of breath. He also reported multiple loose black stools. 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 GI consulted and was placed on Prednisone for 28 days. Palliative care was consulted. Goals of care were discussed at it was decided patient would be sent home with hospice. US of the abdomen was obtained inpatient, not able to remove any ascites. He was started on Lasix and Spironolactone for edema/ascities. There was some concern for a GI bleed given black stools. H/H were stable. They recommend EGD as outpatient but pt has since declines this. He was started on Lactulose and Pantoprazole. They continued his Metoprolol for history of Afib, but not a candidate for oral anticoagulation. He is also taking Lexapro, Hydroxyzine, Trazadone and Ativan for his mood. Since discharge, he reports he is feeling very weak and fatigued. His appetite is poor. He is having SOB even at rest, on intermittent oxygen at 3 L. He has noticed that his abdomen is starting to swell but it is not painful. He denies issues with his bowel or bladder. He is having some issues with bleeding hemorrhoids, using Preparation H OTC. He reports his mood is stable. The only concerns his wife has, is that they have not started the Prednisolone due to cost. They would like to know if there is an alternative available. They would also like clarification on the Metoprolol order. He has  been getting 150 mg BID. Hospice RN reports BP's 90/50's.  Observations/Objective:  Laying in bed. He has gained a significant amount of weight since the last time I saw him. He is alert and oriented x 3. He appears slightly SOB at rest but no audible wheezing or jaundice. He is wearing his oxygen. Abdomen appears distended but he reports this is nontender. He is clearly jaundice.  Assessment and Plan:  Acute Alcoholic Hepatitis with ESLD:  On hospice Will decrease Metoprolol to 25 mg BID They want an alternative to Prednisolone. Advised only alternative to Prednisolone would be IV Methylprednisolone (he currently has no IV access). They will think about this and let me know. Continue all other medications, oxygen orders as previously prescribed. Will follow closely with Hospice. Appreciate hospice care.  Follow Up Instructions:    I discussed the assessment and treatment plan with the patient. The patient was provided an opportunity to ask questions and all were answered. The patient agreed with the plan and demonstrated an understanding of the instructions.   The patient was advised to call back or seek an in-person evaluation if the symptoms worsen or if the condition fails to improve as anticipated.     Nicki Reaper, NP

## 2019-03-26 ENCOUNTER — Encounter: Payer: Self-pay | Admitting: Internal Medicine

## 2019-03-26 NOTE — Patient Instructions (Signed)
Alcoholic Liver Disease  Alcoholic liver disease is liver damage that is caused by drinking a lot of alcohol for a long time. If you have this disease, you must stop drinking alcohol. Follow these instructions at home:   Do not drink alcohol. Follow your treatment plan. Work with your doctor if you need help.  Think about joining an alcohol support group.  Take over-the-counter and prescription medicines only as told by your doctor. These include vitamins.  Do not use medicines or eat foods that have alcohol in them, unless your doctor says that it is safe.  Follow instructions from your doctor about eating a healthy diet.  Keep all follow-up visits as told by your doctor. This is important. Contact a doctor if:  You get a fever.  Your skin starts to look more yellow, pale, or dark.  You get headaches. Get help right away if:  You throw up (vomit) blood.  You have bright red blood in your poop (stool).  Your poop looks black or looks like tar.  You have trouble: ? Thinking. ? Walking. ? Balancing. ? Breathing. Summary  Alcoholic liver disease is liver damage that is caused by drinking a lot of alcohol for a long time.  If you have this disease, you must stop drinking alcohol. Follow your treatment plan, and work with your doctor as needed.  Think about joining an alcohol support group. This information is not intended to replace advice given to you by your health care provider. Make sure you discuss any questions you have with your health care provider. Document Released: 10/07/2009 Document Revised: 08/30/2017 Document Reviewed: 08/30/2017 Elsevier Interactive Patient Education  2019 [employer]  

## 2019-05-25 DEATH — deceased

## 2020-12-13 IMAGING — CR CHEST - 2 VIEW
2 series · 2 of 2 positions shown · non-contrast
Comparison: Chest radiograph 04/28/2018

CLINICAL DATA: Shortness of breath for 2 weeks.

EXAM:
CHEST - 2 VIEW

[w chest pa]
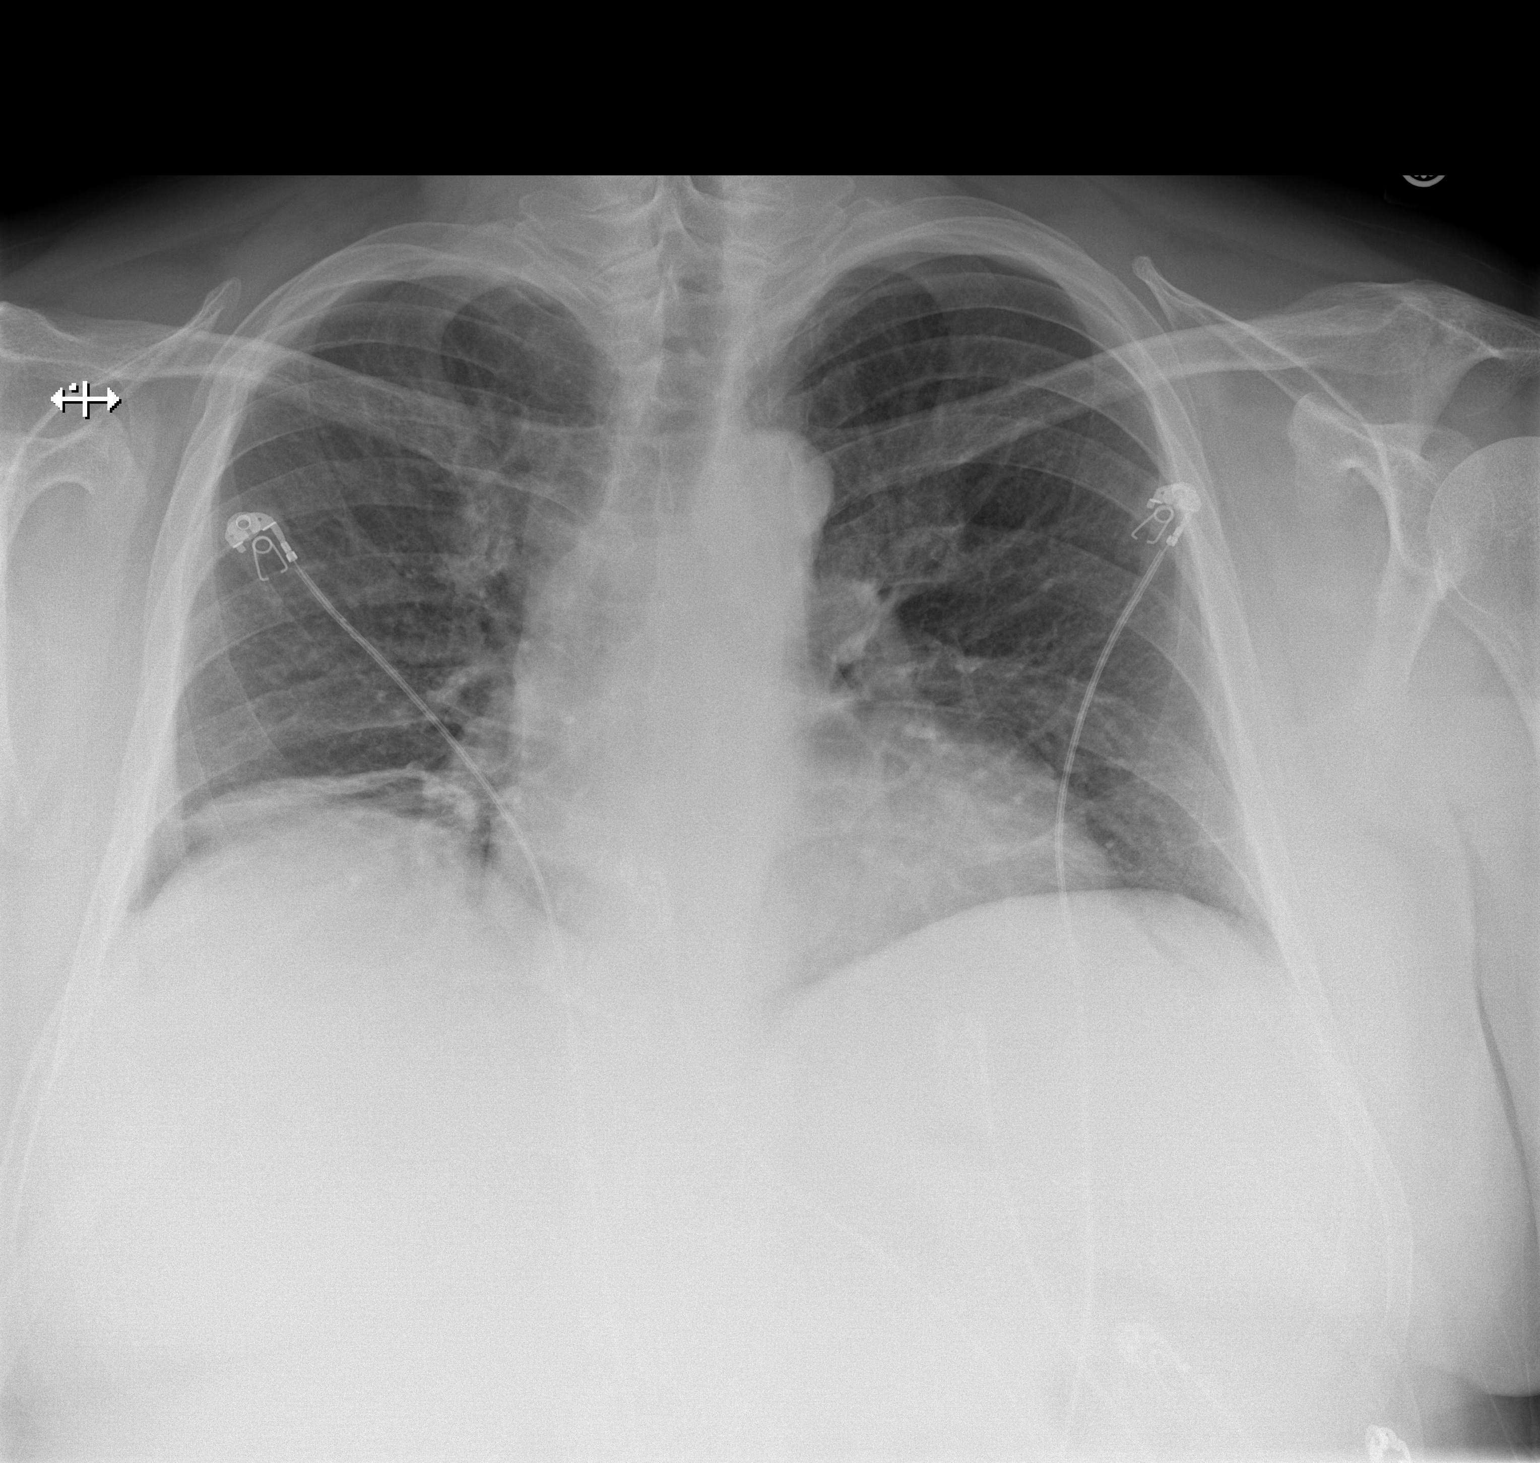

[w chest lat]
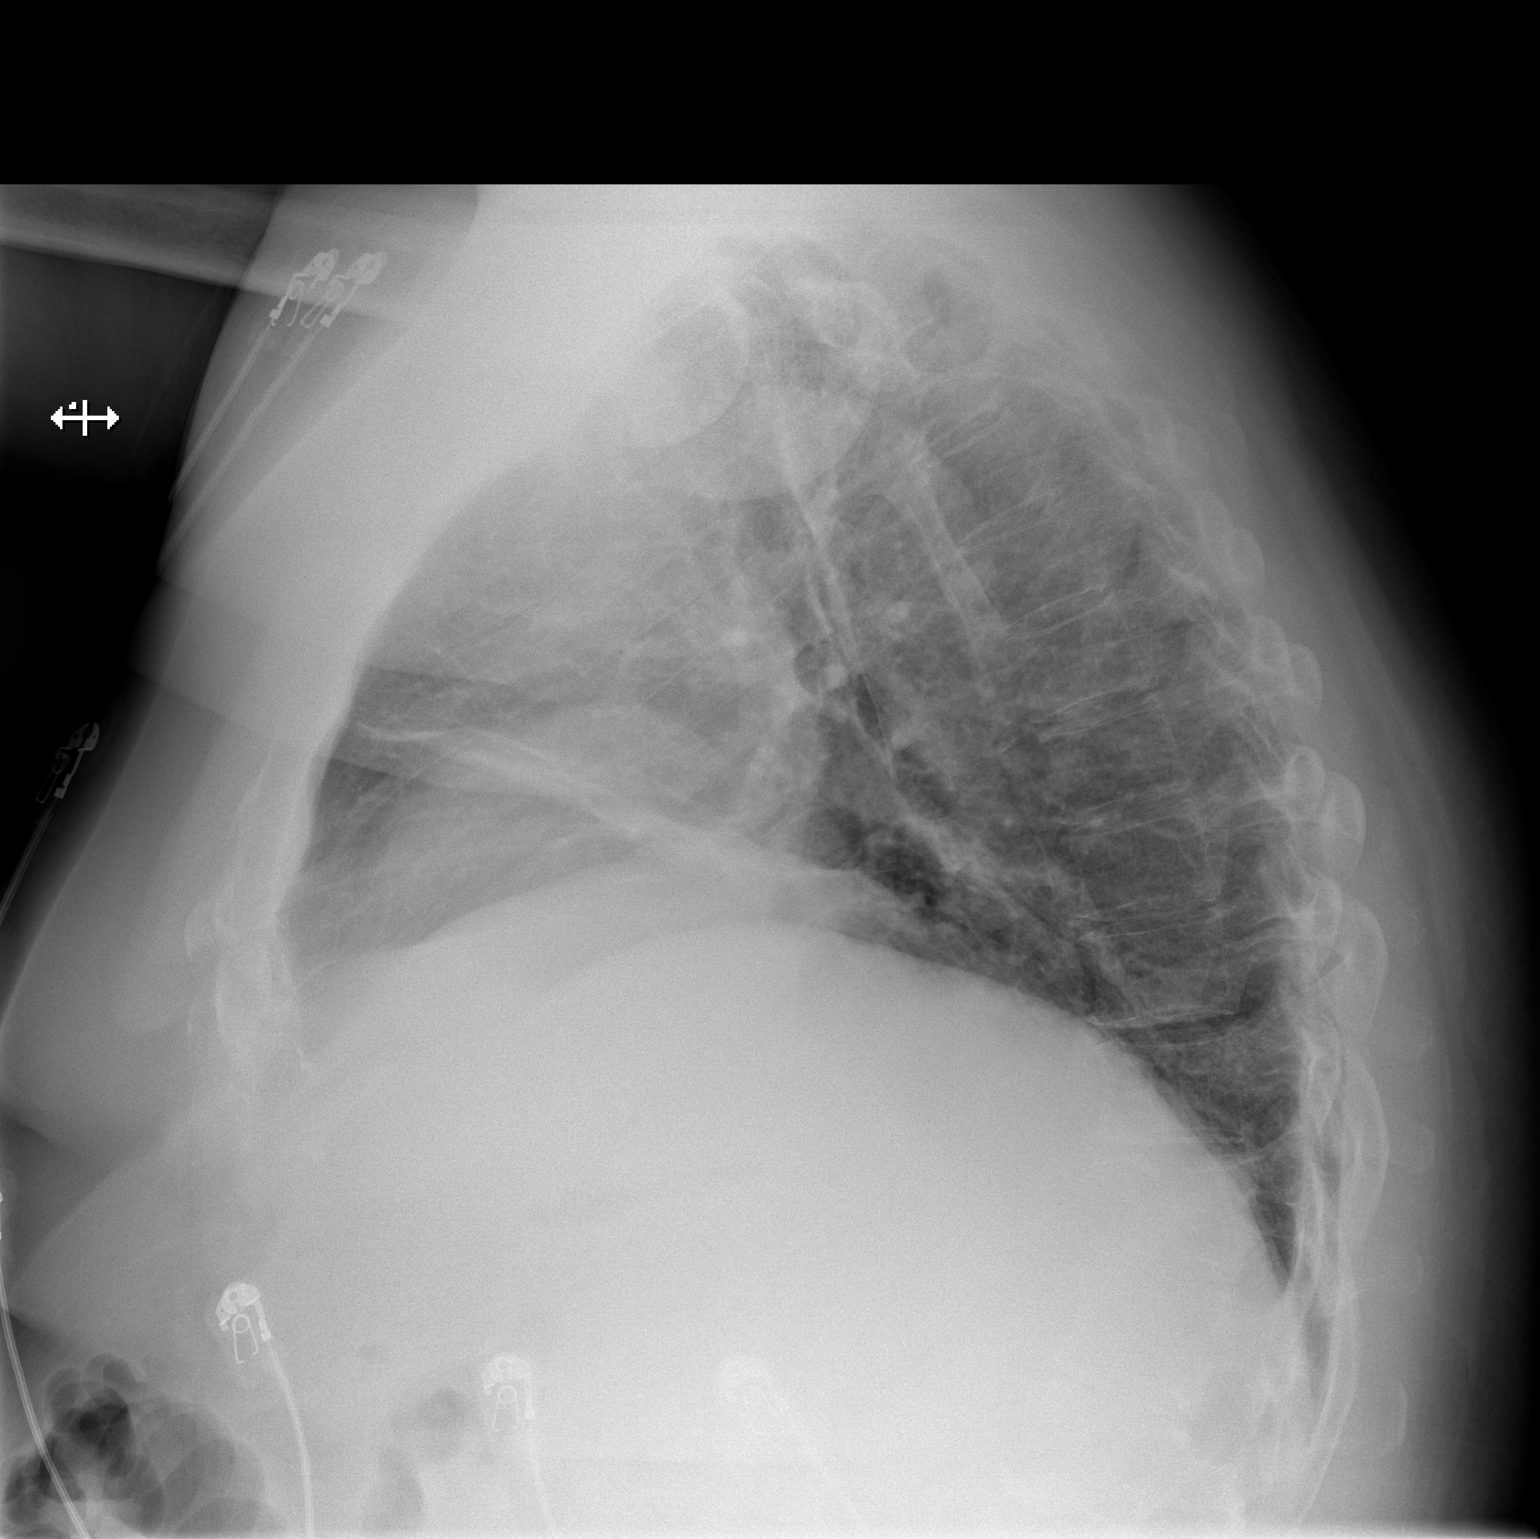

[2 of 2 positions shown; findings below may reference images not displayed]

FINDINGS: Monitoring leads overlie the patient. Stable cardiomegaly. Low lung
volumes. Heterogeneous opacities right lower lung. No pleural
effusion or pneumothorax. Thoracic spine degenerative changes.
IMPRESSION: Heterogeneous opacities right lower lung may represent atelectasis
or infection.

## 2020-12-13 IMAGING — CT CT ABDOMEN AND PELVIS WITH CONTRAST
2 of 8 series · 13 of 46 positions shown, 18 images · IV contrast (ISOVUE)
Comparison: Right upper quadrant abdomen ultrasound obtained
earlier today.

CLINICAL DATA: Abdominal distention and bilateral leg edema.
Lethargy. Weakness. Alcohol abuse. Ex-smoker. Inability to establish
patency of the portal vein at ultrasound earlier today due to
difficulty penetrating the liver.

EXAM:
CT ABDOMEN AND PELVIS WITH CONTRAST
TECHNIQUE: Multidetector CT imaging of the abdomen and pelvis was performed
using the standard protocol following bolus administration of
intravenous contrast.
CONTRAST:  100mL [id number] IOPAMIDOL ([id number]) INJECTION 61%

[Series 3: axial st · axial · 0.98mm/px · z∈[-444,-4]mm · 10 of 104 slices shown, 15 images]
[im 8/104  soft-tissue]
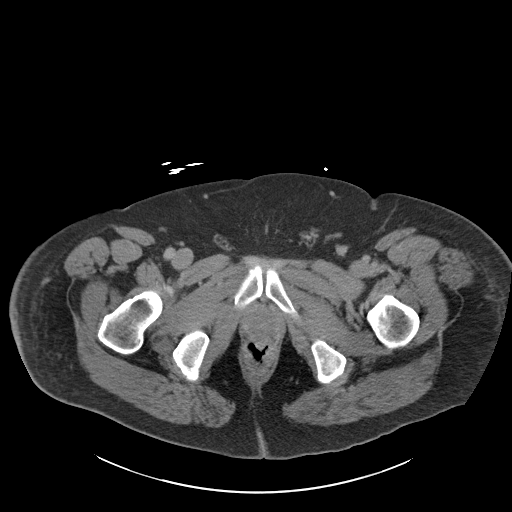
[im 8/104  bone]
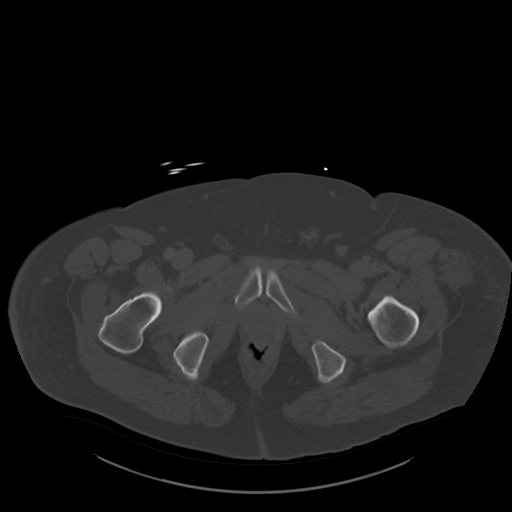
[im 23/104  soft-tissue]
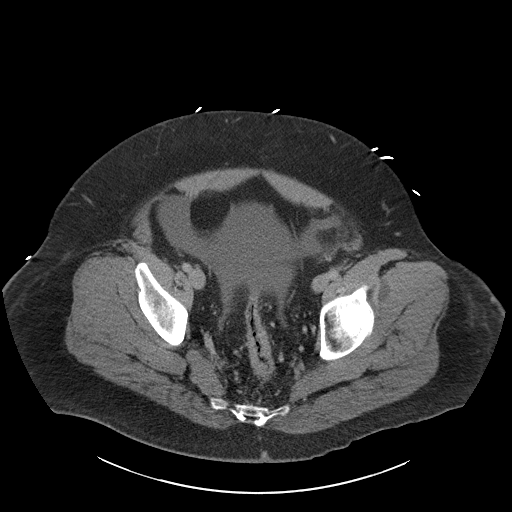
[im 30/104  soft-tissue]
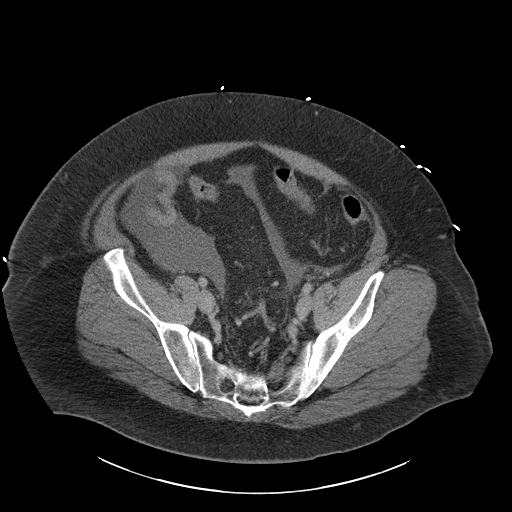
[im 45/104  soft-tissue]
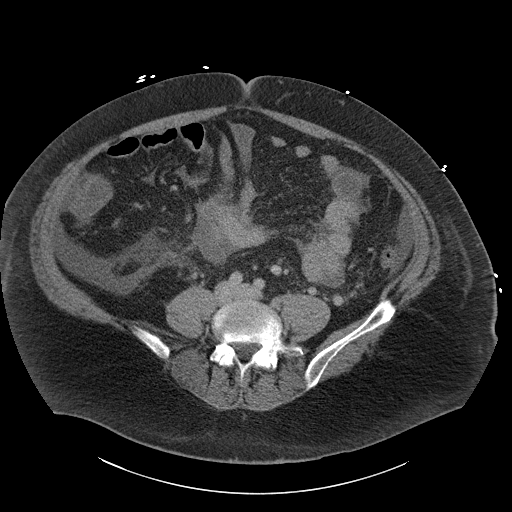
[im 52/104  soft-tissue]
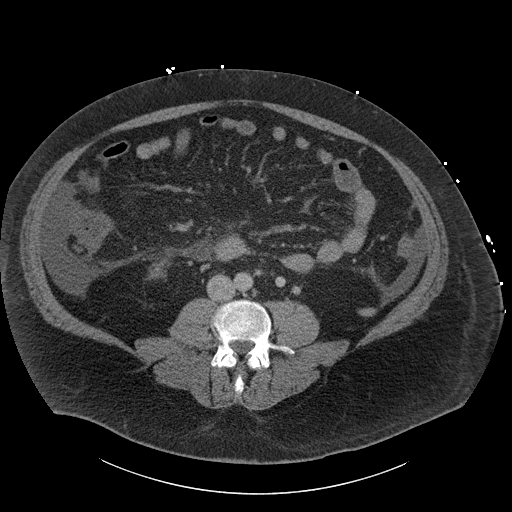
[im 59/104  soft-tissue]
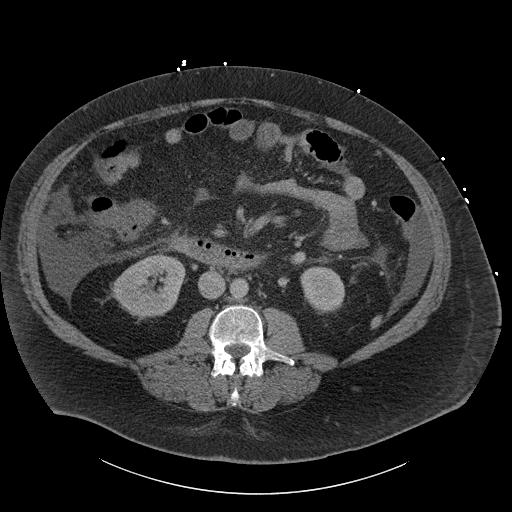
[im 74/104  soft-tissue]
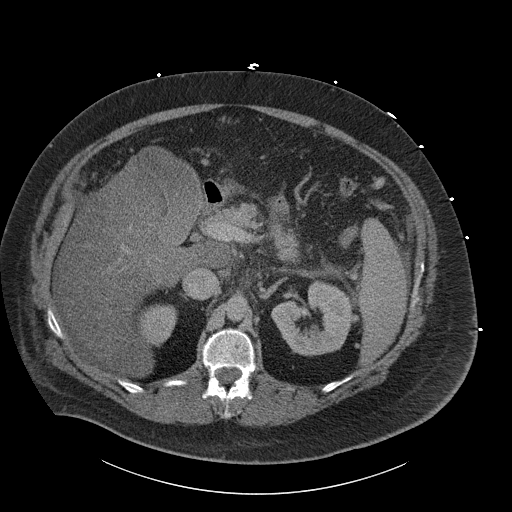
[im 74/104  lung]
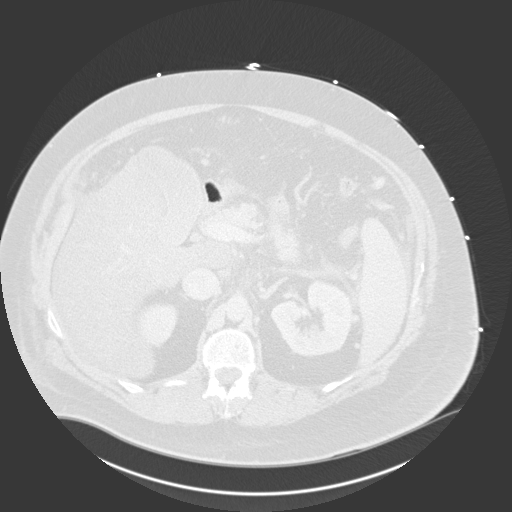
[im 81/104  soft-tissue]
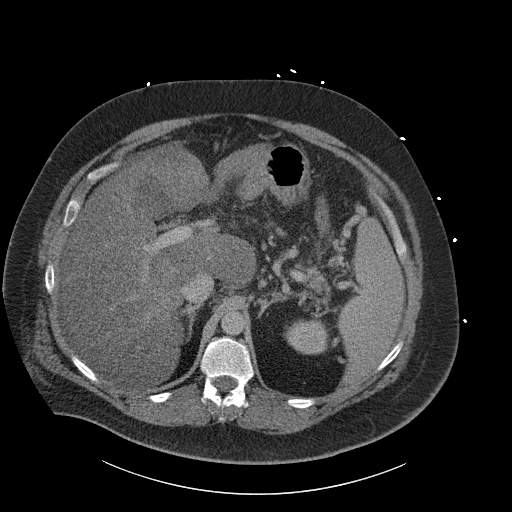
[im 81/104  lung]
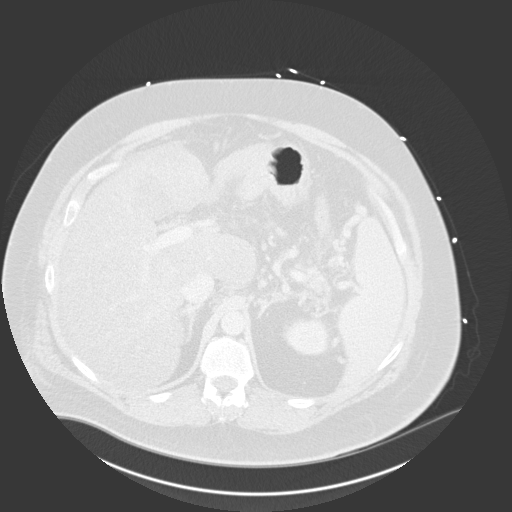
[im 89/104  lung]
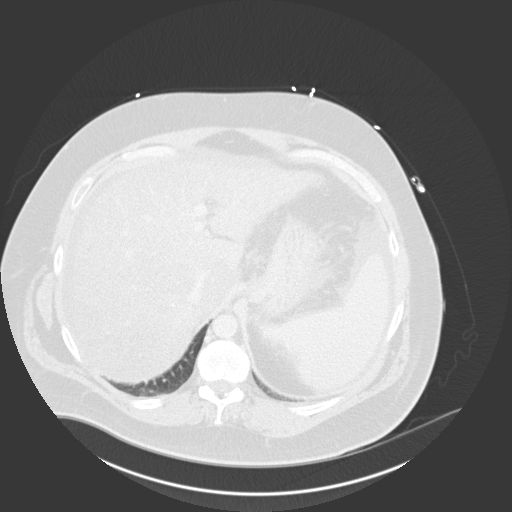
[im 96/104  soft-tissue]
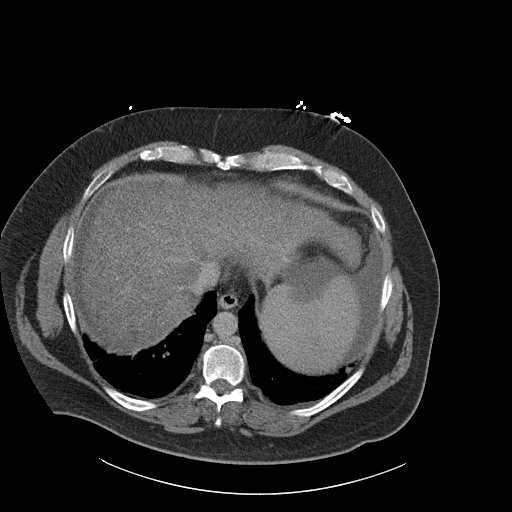
[im 96/104  lung]
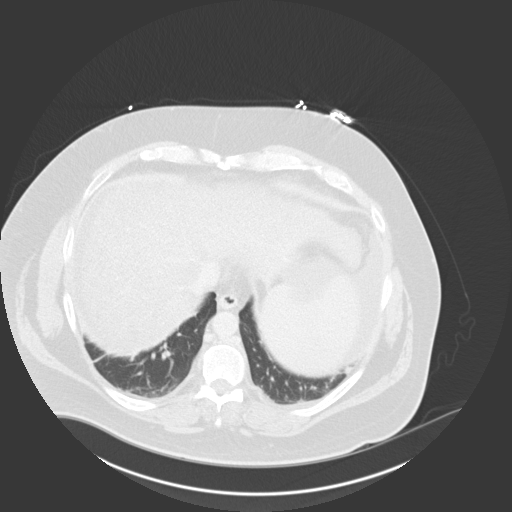
[im 96/104  bone]
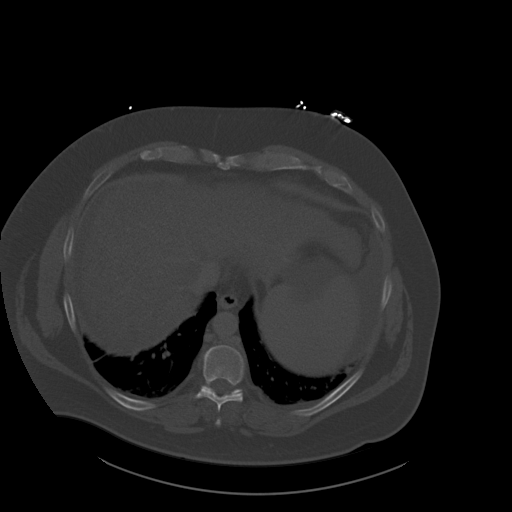

[Series 7: coronal st · coronal · 1.03mm/px · 3 of 207 slices shown]
[im 52/207  soft-tissue]
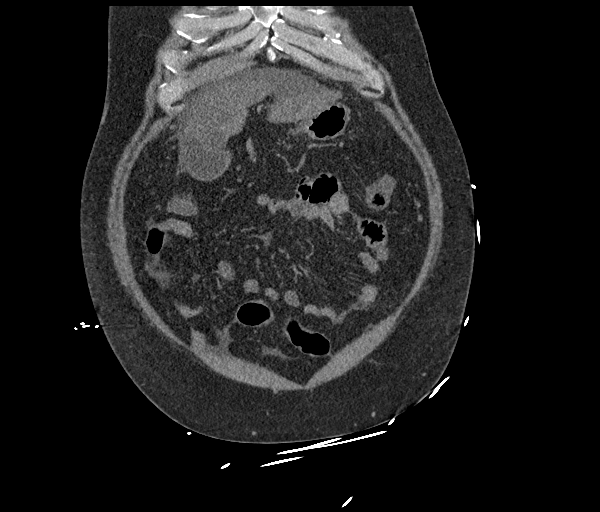
[im 104/207  soft-tissue]
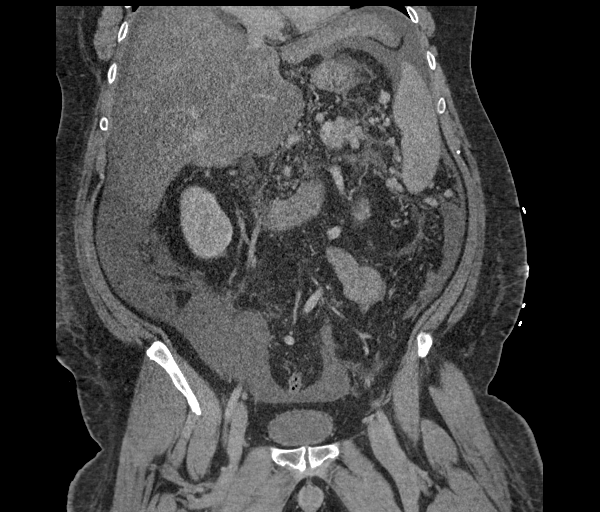
[im 155/207  soft-tissue]
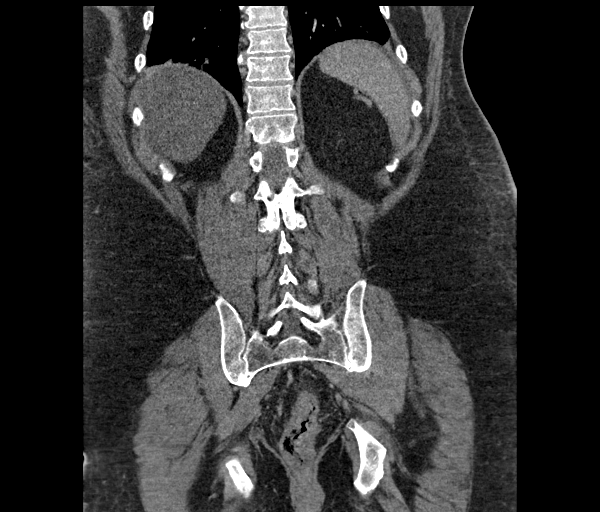

[13 of 46 positions shown; findings below may reference images not displayed]

FINDINGS: Lower chest: Mild bibasilar atelectasis/scarring. Normal sized
heart.

Hepatobiliary: Marked diffuse low density of the liver relative to
the spleen. The caudate lobe of the liver is significantly enlarged.
Probable sludge in the gallbladder. The gallbladder is mildly
dilated borderline wall thickening. No pericholecystic fluid or
stranding.

Pancreas: Unremarkable. No pancreatic ductal dilatation or
surrounding inflammatory changes.

Spleen: Mildly enlarged.

Adrenals/Urinary Tract: Adrenal glands are unremarkable. Kidneys are
normal, without renal calculi, focal lesion, or hydronephrosis.
Bladder is unremarkable.

Stomach/Bowel: Stomach is within normal limits. Appendix appears
normal. Concentric and eccentric rectal wall thickening, measuring
11 mm in maximum thickness distally. Otherwise, unremarkable colon.
Normal appearing small bowel.

Vascular/Lymphatic: Upper abdominal varices. Patent portal vein. No
vascular occlusions are seen. No significant arterial
abnormalities. No enlarged abdominal or pelvic lymph nodes.

Reproductive: Prostate is unremarkable.

Other: Moderate amount of free peritoneal fluid. Small umbilical and
paraumbilical hernias containing fat.

Musculoskeletal: Mild lower thoracic spine degenerative changes.
IMPRESSION: 1. Changes of cirrhosis of the liver with marked hepatic steatosis
and evidence of portal venous hypertension with associated
splenomegaly and upper abdominal varices.
2. Moderate amount of ascites.
3. Concentric and eccentric rectal wall thickening, measuring 11 mm
in maximum thickness distally. This could be due to proctitis. A
neoplasm is less likely but not excluded.
4. Mildly dilated gallbladder with probable sludge and borderline
diffuse wall thickening. The wall thickening may be due to
hypoproteinemia.
